# Patient Record
Sex: Female | Born: 1945 | Race: White | Hispanic: No | Marital: Married | Smoking: Current every day smoker
Health system: Southern US, Community
[De-identification: ages and names within clinical notes are randomized; demographics above are authoritative.]

## PROBLEM LIST (undated history)

## (undated) DIAGNOSIS — J42 Unspecified chronic bronchitis: Secondary | ICD-10-CM

## (undated) DIAGNOSIS — G43909 Migraine, unspecified, not intractable, without status migrainosus: Secondary | ICD-10-CM

## (undated) DIAGNOSIS — E7800 Pure hypercholesterolemia, unspecified: Secondary | ICD-10-CM

## (undated) HISTORY — DX: Unspecified chronic bronchitis: J42

## (undated) HISTORY — DX: Migraine, unspecified, not intractable, without status migrainosus: G43.909

## (undated) HISTORY — DX: Pure hypercholesterolemia, unspecified: E78.00

---
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
## 1977-07-19 HISTORY — PX: BREAST EXCISIONAL BIOPSY: SUR124

## 1977-07-19 HISTORY — PX: BREAST BIOPSY: SHX20

## 1977-07-19 HISTORY — PX: ABDOMINAL HYSTERECTOMY: SHX81

## 2004-10-07 ENCOUNTER — Ambulatory Visit: Payer: Self-pay | Admitting: Internal Medicine

## 2005-12-29 ENCOUNTER — Ambulatory Visit: Payer: Self-pay | Admitting: Chiropractic Medicine

## 2008-05-09 ENCOUNTER — Ambulatory Visit: Payer: Self-pay | Admitting: Internal Medicine

## 2008-06-07 ENCOUNTER — Ambulatory Visit: Payer: Self-pay | Admitting: Internal Medicine

## 2012-06-15 ENCOUNTER — Ambulatory Visit: Payer: Self-pay | Admitting: Internal Medicine

## 2012-06-16 ENCOUNTER — Encounter: Payer: Self-pay | Admitting: Internal Medicine

## 2012-06-23 ENCOUNTER — Ambulatory Visit (INDEPENDENT_AMBULATORY_CARE_PROVIDER_SITE_OTHER): Payer: No Typology Code available for payment source | Admitting: Internal Medicine

## 2012-06-23 ENCOUNTER — Encounter: Payer: Self-pay | Admitting: Internal Medicine

## 2012-06-23 VITALS — BP 137/85 | HR 87 | Temp 97.8°F | Ht 65.0 in | Wt 151.0 lb

## 2012-06-23 DIAGNOSIS — E7800 Pure hypercholesterolemia, unspecified: Secondary | ICD-10-CM

## 2012-06-23 DIAGNOSIS — R5383 Other fatigue: Secondary | ICD-10-CM

## 2012-06-23 DIAGNOSIS — R5381 Other malaise: Secondary | ICD-10-CM

## 2012-06-23 MED ORDER — CEFDINIR 300 MG PO CAPS: 300.0000 mg | ORAL_CAPSULE | Freq: Two times a day (BID) | ORAL | Status: DC

## 2012-06-23 MED ORDER — ATORVASTATIN CALCIUM 20 MG PO TABS: 20.0000 mg | ORAL_TABLET | Freq: Every day | ORAL | Status: DC

## 2012-06-23 NOTE — Patient Instructions (Addendum)
It was nice meeting you today.  I am glad you have been doing well.  Let me know if you have any problems.

## 2012-06-25 ENCOUNTER — Encounter: Payer: Self-pay | Admitting: Internal Medicine

## 2012-06-25 DIAGNOSIS — E7800 Pure hypercholesterolemia, unspecified: Secondary | ICD-10-CM | POA: Insufficient documentation

## 2012-06-25 NOTE — Assessment & Plan Note (Signed)
On Lipitor.  Low fat/low cholesterol diet and exercise.  Check lipid panel and liver function.

## 2012-06-25 NOTE — Progress Notes (Signed)
  [name], female    DOB: 09/12/1945, 66 y.o.   MRN: [id number]  HPI 66 year old female with past history of tobacco abuse (with chronic bronchitis) and hypercholesterolemia who comes in today to follow up on these issues as well as for a complete physical exam.  She is here to establish care.  Former patient of Dr Santo Held.  She states that starting four weeks ago she developed increased drainage.  Has progressed to increased cough and congestion.  Decreased taste and dry mouth.  No chest pain or wheezing. Was treated with Amoxicillin and then Levaquin - but did not finish the Levaquin secondary to intolerance.  No sob.  We discussed the need to stop smoking.  Discussed multiple treatment options.  Stays active.  Bowels stable.    Past Medical History  Diagnosis Date  . Migraines   . Hypercholesterolemia   . Chronic bronchitis     Review of Systems Patient denies any headache, lightheadedness or dizziness.  No chest pain, tightness or palpitations.  No increased shortness of breath, but does still have increased cough and congestion.  No nausea or vomiting.  No acid reflux.  No abdominal pain or cramping.  No bowel change, such as diarrhea, constipation, BRBPR or melana.  No urine change.  Overall she feels she is doing well.  Discussed the need to quit smoking.  Has tried patches, wellbutrin and hpnosis.  Plans to try electronic cigarettes now.  Some fatigue, but overall feeling well.       Objective:   Physical Exam Filed Vitals:   06/23/12 1332  BP: 137/85  Pulse: 87  Temp: 97.8 F (39.22 C)   66 year old female in no acute distress.   HEENT:  Nares- clear.  Oropharynx - without lesions. NECK:  Supple.  Nontender.  No audible bruit.  HEART:  Appears to be regular. LUNGS:  No crackles or wheezing audible.  Respirations even and unlabored.  RADIAL PULSE:  Equal bilaterally.    BREASTS:  No nipple discharge or nipple retraction present.  Could not  appreciate any distinct nodules or axillary adenopathy.  ABDOMEN:  Soft, nontender.  Bowel sounds present and normal.  No audible abdominal bruit.  GU:  Normal external genitalia.  Vaginal vault without lesions.  S/p hysterectomy.  Could not appreciate any adnexal masses or tenderness.   RECTAL:  Heme negative.   EXTREMITIES:  No increased edema present.  DP pulses palpable and equal bilaterally.           Assessment & Plan:  BRONCHITIS.  Still with increased cough and congestion.  Partially treated.  Will treat with omnicef 300mg  bid x 10 days.  Robitussin as directed.  Saline nasal flushes.  Rest.  Fluids.  Explained to her if symptoms changed, worsened or do not resolve - she is to be reevaluated.  FATIGUE.  Check cbc, met c and tsh.      HEALTH MAINTENANCE.  Physical today.  Mammogram 11/13 - normal (done at [facility]).  Due a colonoscopy.  Has her forms and plans to contact Dr Elliot's office.  I spent 45 minutes with the patient and more than 50% of the time was spent  In consultation regarding the above.

## 2012-08-08 ENCOUNTER — Other Ambulatory Visit (INDEPENDENT_AMBULATORY_CARE_PROVIDER_SITE_OTHER): Payer: No Typology Code available for payment source

## 2012-08-08 DIAGNOSIS — E7800 Pure hypercholesterolemia, unspecified: Secondary | ICD-10-CM

## 2012-08-08 DIAGNOSIS — R5381 Other malaise: Secondary | ICD-10-CM

## 2012-08-08 DIAGNOSIS — R5383 Other fatigue: Secondary | ICD-10-CM

## 2012-08-08 LAB — COMPREHENSIVE METABOLIC PANEL
ALT: 28 U/L (ref 0–35)
AST: 23 U/L (ref 0–37)
Alkaline Phosphatase: 73 U/L (ref 39–117)
Creatinine, Ser: 0.8 mg/dL (ref 0.4–1.2)
Sodium: 138 mEq/L (ref 135–145)
Total Bilirubin: 0.7 mg/dL (ref 0.3–1.2)
Total Protein: 7.5 g/dL (ref 6.0–8.3)

## 2012-08-08 LAB — CBC WITH DIFFERENTIAL/PLATELET
Basophils Absolute: 0 10*3/uL (ref 0.0–0.1)
Eosinophils Absolute: 0.3 10*3/uL (ref 0.0–0.7)
HCT: 40.8 % (ref 36.0–46.0)
Lymphs Abs: 2.6 10*3/uL (ref 0.7–4.0)
MCHC: 33.8 g/dL (ref 30.0–36.0)
MCV: 94.5 fl (ref 78.0–100.0)
Monocytes Absolute: 0.6 10*3/uL (ref 0.1–1.0)
Monocytes Relative: 7.3 % (ref 3.0–12.0)
Neutro Abs: 4.8 10*3/uL (ref 1.4–7.7)
Platelets: 383 10*3/uL (ref 150.0–400.0)
RDW: 13.4 % (ref 11.5–14.6)

## 2012-08-08 LAB — LIPID PANEL
HDL: 71 mg/dL (ref >39.00)
Total CHOL/HDL Ratio: 3
Triglycerides: 176 mg/dL — ABNORMAL HIGH (ref 0.0–149.0)
VLDL: 35.2 mg/dL (ref 0.0–40.0)

## 2012-12-19 LAB — HM COLONOSCOPY

## 2012-12-26 ENCOUNTER — Encounter: Payer: Self-pay | Admitting: Internal Medicine

## 2012-12-26 ENCOUNTER — Ambulatory Visit (INDEPENDENT_AMBULATORY_CARE_PROVIDER_SITE_OTHER): Payer: Medicare Other | Admitting: Internal Medicine

## 2012-12-26 VITALS — BP 120/80 | HR 75 | Temp 97.8°F | Ht 65.0 in | Wt 150.2 lb

## 2012-12-26 DIAGNOSIS — E7800 Pure hypercholesterolemia, unspecified: Secondary | ICD-10-CM

## 2012-12-26 DIAGNOSIS — K5790 Diverticulosis of intestine, part unspecified, without perforation or abscess without bleeding: Secondary | ICD-10-CM

## 2012-12-26 DIAGNOSIS — K5730 Diverticulosis of large intestine without perforation or abscess without bleeding: Secondary | ICD-10-CM

## 2012-12-26 NOTE — Progress Notes (Signed)
  [name], female    DOB: 1945/12/17, 67 y.o.   MRN: [id number]  HPI 67 year old female with past history of tobacco abuse (with chronic bronchitis) and hypercholesterolemia who comes in today for a scheduled follow up.  No sob.  We discussed the need to stop smoking.  Breathing stable.  She does have some increased drainage.  No significant sinus pressure.  No increased cough or congestion.  Stays active.  Bowels stable.  Overall she feels she is doing well.  Had colonoscopy last week.  Revealed diverticulosis and hemorrhoids.     Past Medical History  Diagnosis Date  . Migraines   . Hypercholesterolemia   . Chronic bronchitis     Current Outpatient Prescriptions on File Prior to Visit  Medication Sig Dispense Refill  . atorvastatin (LIPITOR) 20 MG tablet Take 1 tablet (20 mg total) by mouth daily.  90 tablet  3  . cholecalciferol (VITAMIN D) 400 UNITS TABS Take 400 Units by mouth daily.      . fish oil-omega-3 fatty acids 1000 MG capsule Take 1 g by mouth daily.      Marland Kitchen FLAXSEED, LINSEED, PO Take by mouth.       No current facility-administered medications on file prior to visit.    Review of Systems Patient denies any headache, lightheadedness or dizziness.  No chest pain, tightness or palpitations.  No increased shortness of breath.  Some increased drainage.  No increased cough or chest congestion.  No nausea or vomiting.  No acid reflux.  No abdominal pain or cramping.  No bowel change, such as diarrhea, constipation, BRBPR or melana.  No urine change.  Overall she feels she is doing well.  Discussed the need to quit smoking.  Has tried patches, wellbutrin and hypnosis.       Objective:   Physical Exam  Filed Vitals:   12/26/12 0802  BP: 120/80  Pulse: 75  Temp: 97.8 F (48.85 C)   67 year old female in no acute distress.   HEENT:  Nares- clear.  Oropharynx - without lesions. NECK:  Supple.  Nontender.  No audible bruit.  HEART:  Appears  to be regular. LUNGS:  No crackles or wheezing audible.  Respirations even and unlabored.  RADIAL PULSE:  Equal bilaterally.   ABDOMEN:  Soft, nontender.  Bowel sounds present and normal.  No audible abdominal bruit.  EXTREMITIES:  No increased edema present.  DP pulses palpable and equal bilaterally.           Assessment & Plan:  PROBABLE ALLERGIES.  Increased post nasal drainage.  Nasonex given.  Use saline nasal spray as directed.    HEALTH MAINTENANCE.  Physical last visit.   Mammogram 11/13 - normal (done at [facility]).  Had colonoscopy last week.  Revealed diverticulosis and hemorrhoids.

## 2012-12-27 ENCOUNTER — Encounter: Payer: Self-pay | Admitting: Internal Medicine

## 2012-12-27 DIAGNOSIS — K5790 Diverticulosis of intestine, part unspecified, without perforation or abscess without bleeding: Secondary | ICD-10-CM | POA: Insufficient documentation

## 2012-12-27 NOTE — Assessment & Plan Note (Signed)
On Lipitor.  Low fat/low cholesterol diet and exercise.  Check lipid panel and liver function.

## 2012-12-27 NOTE — Assessment & Plan Note (Signed)
Colonoscopy last week revealed diverticulosis and hemorrhoids.  Asymptomatic.

## 2013-04-16 ENCOUNTER — Telehealth: Payer: Self-pay | Admitting: Internal Medicine

## 2013-04-16 NOTE — Telephone Encounter (Signed)
I an ok taking them as new pts (as long as her daughter's children are >18).  Thanks

## 2013-04-16 NOTE — Telephone Encounter (Signed)
Council [employer]ic , Monica Morales's daughter called wanting to establish herself and her son and daughter as new patient's with you. Please advise.

## 2013-05-30 LAB — HM MAMMOGRAPHY: HM Mammogram: NEGATIVE

## 2013-05-31 ENCOUNTER — Encounter: Payer: Self-pay | Admitting: Internal Medicine

## 2013-06-27 ENCOUNTER — Other Ambulatory Visit (INDEPENDENT_AMBULATORY_CARE_PROVIDER_SITE_OTHER): Payer: Medicare Other

## 2013-06-27 ENCOUNTER — Encounter (INDEPENDENT_AMBULATORY_CARE_PROVIDER_SITE_OTHER): Payer: Self-pay

## 2013-06-27 DIAGNOSIS — E7800 Pure hypercholesterolemia, unspecified: Secondary | ICD-10-CM

## 2013-06-27 LAB — LIPID PANEL
HDL: 63.7 mg/dL (ref >39.00)
Total CHOL/HDL Ratio: 3
VLDL: 45.8 mg/dL — ABNORMAL HIGH (ref 0.0–40.0)

## 2013-06-27 LAB — HEPATIC FUNCTION PANEL
AST: 26 U/L (ref 0–37)
Albumin: 4.5 g/dL (ref 3.5–5.2)

## 2013-07-02 ENCOUNTER — Encounter: Payer: Self-pay | Admitting: Internal Medicine

## 2013-07-02 ENCOUNTER — Ambulatory Visit (INDEPENDENT_AMBULATORY_CARE_PROVIDER_SITE_OTHER): Payer: Medicare Other | Admitting: Internal Medicine

## 2013-07-02 VITALS — BP 132/80 | HR 81 | Temp 98.2°F | Resp 12 | Ht 63.5 in | Wt 150.0 lb

## 2013-07-02 DIAGNOSIS — K5790 Diverticulosis of intestine, part unspecified, without perforation or abscess without bleeding: Secondary | ICD-10-CM

## 2013-07-02 DIAGNOSIS — R030 Elevated blood-pressure reading, without diagnosis of hypertension: Secondary | ICD-10-CM | POA: Insufficient documentation

## 2013-07-02 DIAGNOSIS — K5730 Diverticulosis of large intestine without perforation or abscess without bleeding: Secondary | ICD-10-CM

## 2013-07-02 DIAGNOSIS — Z720 Tobacco use: Secondary | ICD-10-CM

## 2013-07-02 DIAGNOSIS — F17200 Nicotine dependence, unspecified, uncomplicated: Secondary | ICD-10-CM

## 2013-07-02 DIAGNOSIS — D473 Essential (hemorrhagic) thrombocythemia: Secondary | ICD-10-CM | POA: Insufficient documentation

## 2013-07-02 DIAGNOSIS — IMO0001 Reserved for inherently not codable concepts without codable children: Secondary | ICD-10-CM

## 2013-07-02 DIAGNOSIS — E7800 Pure hypercholesterolemia, unspecified: Secondary | ICD-10-CM

## 2013-07-02 DIAGNOSIS — J069 Acute upper respiratory infection, unspecified: Secondary | ICD-10-CM

## 2013-07-02 MED ORDER — ATORVASTATIN CALCIUM 20 MG PO TABS: 20.0000 mg | ORAL_TABLET | Freq: Every day | ORAL | Status: DC

## 2013-07-02 NOTE — Assessment & Plan Note (Addendum)
Colonoscopy 6/14 revealed diverticulosis and hemorrhoids.  Asymptomatic.   

## 2013-07-02 NOTE — Progress Notes (Signed)
Pre visit review using our clinic review tool, if applicable. No additional management support is needed unless otherwise documented below in the visit note. 

## 2013-07-02 NOTE — Assessment & Plan Note (Addendum)
On Lipitor.  Low fat/low cholesterol diet and exercise.  Lipid panel 06/27/13 revealed total cholesterol 217, triglycerides 229, HDL 64 and LDL 109.  Triglycerides increased.  [facility] Lipid diet given.  Continue atorvastatin.  Follow.   

## 2013-07-02 NOTE — Assessment & Plan Note (Signed)
Have discussed with her the need to quit smoking.  Follow.

## 2013-07-02 NOTE — Progress Notes (Signed)
  [name], female    DOB: 23-Dec-1945, 67 y.o.   MRN: [id number]  HPI 67 year old female with past history of tobacco abuse (with chronic bronchitis) and hypercholesterolemia who comes in today to follow up on these issues as well as for a complete physical exam.  States overall she has been doing relatively well.  She does report that starting last week she began sneezing.  Increased cough and drainage.  Cough minimally productive.  She started her husband's abx 6 days ago.  Also taking mucinex DM.  Cough worse at night.  No vomiting or diarrhea.  No sob.  No chest tightness.  We have discussed the need to stop smoking.   Stays active.  Bowels stable.  Overall she feels she is doing well.  Had colonoscopy 6/14.   Revealed diverticulosis and hemorrhoids.     Past Medical History  Diagnosis Date  . Migraines   . Hypercholesterolemia   . Chronic bronchitis     Current Outpatient Prescriptions on File Prior to Visit  Medication Sig Dispense Refill  . atorvastatin (LIPITOR) 20 MG tablet Take 1 tablet (20 mg total) by mouth daily.  90 tablet  3  . cholecalciferol (VITAMIN D) 400 UNITS TABS Take 400 Units by mouth daily.      . fish oil-omega-3 fatty acids 1000 MG capsule Take 1 g by mouth daily.      Marland Kitchen FLAXSEED, LINSEED, PO Take by mouth.       No current facility-administered medications on file prior to visit.    Review of Systems Patient denies any headache, lightheadedness or dizziness.  No chest pain, tightness or palpitations.  No increased shortness of breath.  Some increased drainage.  Cough and chest congestion as outlined.   No nausea or vomiting.  No acid reflux.  No abdominal pain or cramping.  No bowel change, such as diarrhea, constipation, BRBPR or melana.  No urine change.  Overall she feels she is doing well.  Have discussed the need to quit smoking.  Has tried patches, wellbutrin and hypnosis.       Objective:   Physical Exam  Filed Vitals:    07/02/13 1027  BP: 132/80  Pulse: 81  Temp: 98.2 F (36.8 C)  Resp: 12   Blood pressure recheck:  140-142/96, pulse 19  67 year old female in no acute distress.   HEENT:  Nares- slightly erythematous turbinates.  Oropharynx - without lesions.  TMs visualized - without erythema.  NECK:  Supple.  Nontender.  No audible bruit.  HEART:  Appears to be regular. LUNGS:  No crackles or wheezing audible.  Respirations even and unlabored.  RADIAL PULSE:  Equal bilaterally.    BREASTS:  No nipple discharge or nipple retraction present.  Could not appreciate any distinct nodules or axillary adenopathy.  ABDOMEN:  Soft, nontender.  Bowel sounds present and normal.  No audible abdominal bruit.  GU:  Not performed.     EXTREMITIES:  No increased edema present.  DP pulses palpable and equal bilaterally.          Assessment & Plan:  HEALTH MAINTENANCE.  Physical today.   Mammogram 05/30/13 - Birads I  (done at [facility]).  Had colonoscopy 6/14.   Revealed diverticulosis and hemorrhoids.   States had a recent mammogram through Dr Santo Held office.  Obtain results.  States was normal.

## 2013-07-02 NOTE — Assessment & Plan Note (Signed)
Has been on her husbands abx for 6 days now.  Taking mucinex DM.  Feeling better.  Will complete the abx course.  Change Mucinex DM to one tablet in the am and robitussin DM in the evening.  Saline nasal spray to help with drainage.  Follow.  She is to notify me if symptoms worsen or do not completely resolve.

## 2013-07-02 NOTE — Assessment & Plan Note (Signed)
Has not had an issue previously with elevated blood pressure.  Elevated today.  May be related to being sick.  Will treat infection.  Have her spot check her pressure.  Get her back in soon to reassess.  If persistent elevation, will require medication.

## 2013-07-20 ENCOUNTER — Ambulatory Visit: Payer: Self-pay | Admitting: Internal Medicine

## 2013-07-20 ENCOUNTER — Telehealth: Payer: Self-pay | Admitting: Internal Medicine

## 2013-07-20 ENCOUNTER — Ambulatory Visit (INDEPENDENT_AMBULATORY_CARE_PROVIDER_SITE_OTHER): Payer: Medicare HMO | Admitting: Internal Medicine

## 2013-07-20 ENCOUNTER — Encounter: Payer: Self-pay | Admitting: Internal Medicine

## 2013-07-20 VITALS — BP 130/90 | HR 83 | Temp 97.5°F | Wt 149.0 lb

## 2013-07-20 DIAGNOSIS — J189 Pneumonia, unspecified organism: Secondary | ICD-10-CM | POA: Insufficient documentation

## 2013-07-20 MED ORDER — LEVOFLOXACIN 500 MG PO TABS: 500.0000 mg | ORAL_TABLET | Freq: Every day | ORAL | Status: DC

## 2013-07-20 MED ORDER — HYDROCODONE-HOMATROPINE 5-1.5 MG/5ML PO SYRP: 5.0000 mL | ORAL_SOLUTION | Freq: Four times a day (QID) | ORAL | Status: DC | PRN

## 2013-07-20 NOTE — Telephone Encounter (Signed)
Patient informed and verbalized understanding

## 2013-07-20 NOTE — Telephone Encounter (Signed)
°  Patient Information:  Caller Name: Ellarae  Phone: [phone]  Patient: Monica, Morales  Gender: Female  DOB: [birth year]  Age: 68 Years  PCP: Einar Pheasant  Office Follow Up:  Does the office need to follow up with this patient?: No  Instructions For The Office: N/A   Symptoms  Reason For Call & Symptoms: Cough that is sometimes productive that is not improving. Diagnosed with bronchitis at walk in clinic 12/28 and has been taking abx since then.   Reviewed Health History In EMR: Yes  Reviewed Medications In EMR: Yes  Reviewed Allergies In EMR: Yes  Reviewed Surgeries / Procedures: Yes  Date of Onset of Symptoms: 07/15/2013  Treatments Tried: Abx, prescription cough syrup, Mucinex  Treatments Tried Worked: No  Guideline(s) Used:  Cough  Disposition Per Guideline:   See Today or Tomorrow in Office  Reason For Disposition Reached:   Continuous (nonstop) coughing interferes with work or school and no improvement using cough treatment per Care Advice  Advice Given:  Reassurance  Coughing is the way that our lungs remove irritants and mucus. It helps protect our lungs from getting pneumonia.  Expected Course:   The expected course depends on what is causing the cough.  Viral bronchitis (chest cold) causes a cough that lasts 1 to 3 weeks. Sometimes you may cough up lots of phlegm (sputum, mucus). The mucus can normally be white, gray, yellow, or green.  Call Back If:  Difficulty breathing  Cough lasts more than 3 weeks  You become worse.  Patient Will Follow Care Advice:  YES  Appointment Scheduled:  07/20/2013 16:00:00 Appointment Scheduled Provider:  Ronette Deter (Adults only)

## 2013-07-20 NOTE — Progress Notes (Signed)
Pre-visit discussion using our clinic review tool. No additional management support is needed unless otherwise documented below in the visit note.  

## 2013-07-20 NOTE — Assessment & Plan Note (Signed)
Symptoms, and her exam today are most consistent with left lower lobe pneumonia. However, chest x-ray shows no acute left-sided infiltrate, however suggests atelectasis and blunting of the costophrenic angle on the right. Given that her cough has been persistent for 4 weeks, recommended that we start Levaquin and repeat a CXR in 3-4 weeks to make sure right sided atelectasis has cleared. If cough persists, would have low threshold to get CT chest. Will continue Hycodan as needed for cough.Follow up in 1 week.

## 2013-07-20 NOTE — Telephone Encounter (Signed)
Preliminary report on CXR showed some atelectasis (collapse of small airways) in the right lower lung but no clear pneumonia. This could be related to recent bronchitis. I would like for her to start the Levaquin as directed. We will wait on final report on CXR, but most likely, I will plan to repeat an xray in 3-4 weeks. She should still keep follow up appointment next week for recheck or call sooner with worsening symptoms.

## 2013-07-20 NOTE — Telephone Encounter (Signed)
FYI

## 2013-07-20 NOTE — Progress Notes (Signed)
[name], female    DOB: 01-05-1946, 68 y.o.   MRN: [id number]  HPI 68 year old female with history of tobacco abuse presents for acute visit complaining of persistent cough productive of purulent sputum. Initially,developed cough 4 weeks ago. Took husband's Rx for Augmentin with no improvement. Sunday developed worsening cough and chest tightness. Went to urgent care, [facility], where rapid flu was neg. Started on Clarithromycin. Slight improvement over last few days. However, continues to have cough productive of purulent sputum and occasional right lower chest wall pain. She denies any fever or chills. She denies nasal congestion, sore throat. Her husband is ill with flu. She has been taking codeine-based cough syrup from urgent care with some improvement in symptoms at night.  Outpatient Encounter Prescriptions as of 07/20/2013  Medication Sig  . atorvastatin (LIPITOR) 20 MG tablet Take 1 tablet (20 mg total) by mouth daily.  . cholecalciferol (VITAMIN D) 400 UNITS TABS Take 400 Units by mouth daily.  . fish oil-omega-3 fatty acids 1000 MG capsule Take 1 g by mouth daily.  Marland Kitchen FLAXSEED, LINSEED, PO Take by mouth.  Marland Kitchen HYDROcodone-homatropine (HYCODAN) 5-1.5 MG/5ML syrup Take 5 mLs by mouth every 6 (six) hours as needed for cough.  . [DISCONTINUED] clarithromycin (BIAXIN) 500 MG tablet Take 500 mg by mouth 2 (two) times daily.  . [DISCONTINUED] HYDROcodone-homatropine (HYCODAN) 5-1.5 MG/5ML syrup Take 5 mLs by mouth every 6 (six) hours as needed for cough.  Marland Kitchen levofloxacin (LEVAQUIN) 500 MG tablet Take 1 tablet (500 mg total) by mouth daily.   BP 130/90  Pulse 83  Temp(Src) 97.5 F (36.4 C) (Oral)  Wt 149 lb (67.586 kg)  SpO2 98%   Review of Systems  Constitutional: Positive for fatigue. Negative for fever, chills and unexpected weight change.  HENT: Negative for congestion, ear discharge, ear pain, facial swelling, hearing loss, mouth sores, nosebleeds,  postnasal drip, rhinorrhea, sinus pressure, sneezing, sore throat, tinnitus, trouble swallowing and voice change.   Eyes: Negative for pain, discharge, redness and visual disturbance.  Respiratory: Positive for cough. Negative for chest tightness, shortness of breath, wheezing and stridor.   Cardiovascular: Positive for chest pain. Negative for palpitations and leg swelling.  Musculoskeletal: Negative for arthralgias, myalgias, neck pain and neck stiffness.  Skin: Negative for color change and rash.  Neurological: Negative for dizziness, weakness, light-headedness and headaches.  Hematological: Negative for adenopathy.       Objective:   Physical Exam  Constitutional: She is oriented to person, place, and time. She appears well-developed and well-nourished. No distress.  HENT:  Head: Normocephalic and atraumatic.  Right Ear: External ear normal.  Left Ear: External ear normal.  Nose: Nose normal.  Mouth/Throat: Oropharynx is clear and moist. No oropharyngeal exudate.  Eyes: Conjunctivae are normal. Pupils are equal, round, and reactive to light. Right eye exhibits no discharge. Left eye exhibits no discharge. No scleral icterus.  Neck: Normal range of motion. Neck supple. No tracheal deviation present. No thyromegaly present.  Cardiovascular: Normal rate, regular rhythm, normal heart sounds and intact distal pulses.  Exam reveals no gallop and no friction rub.   No murmur heard. Pulmonary/Chest: Effort normal. No accessory muscle usage. Not tachypneic. No respiratory distress. She has decreased breath sounds in the left lower field. She has no wheezes. She has rhonchi in the left lower field. She has no rales. She exhibits no tenderness.  Musculoskeletal: Normal range of motion. She exhibits no edema and no tenderness.  Lymphadenopathy:  She has no cervical adenopathy.  Neurological: She is alert and oriented to person, place, and time. No cranial nerve deficit. She exhibits normal  muscle tone. Coordination normal.  Skin: Skin is warm and dry. No rash noted. She is not diaphoretic. No erythema. No pallor.  Psychiatric: She has a normal mood and affect. Her behavior is normal. Judgment and thought content normal.          Assessment & Plan:

## 2013-07-20 NOTE — Telephone Encounter (Signed)
Noted  

## 2013-07-27 ENCOUNTER — Ambulatory Visit (INDEPENDENT_AMBULATORY_CARE_PROVIDER_SITE_OTHER): Payer: Medicare HMO | Admitting: Internal Medicine

## 2013-07-27 ENCOUNTER — Encounter (INDEPENDENT_AMBULATORY_CARE_PROVIDER_SITE_OTHER): Payer: Self-pay

## 2013-07-27 ENCOUNTER — Encounter: Payer: Self-pay | Admitting: Internal Medicine

## 2013-07-27 VITALS — BP 120/80 | HR 71 | Temp 97.6°F | Ht 63.5 in | Wt 150.5 lb

## 2013-07-27 DIAGNOSIS — J069 Acute upper respiratory infection, unspecified: Secondary | ICD-10-CM

## 2013-07-27 DIAGNOSIS — IMO0001 Reserved for inherently not codable concepts without codable children: Secondary | ICD-10-CM

## 2013-07-27 DIAGNOSIS — R030 Elevated blood-pressure reading, without diagnosis of hypertension: Secondary | ICD-10-CM

## 2013-07-27 NOTE — Progress Notes (Signed)
Pre-visit discussion using our clinic review tool. No additional management support is needed unless otherwise documented below in the visit note.  

## 2013-07-30 ENCOUNTER — Encounter: Payer: Self-pay | Admitting: Internal Medicine

## 2013-07-30 NOTE — Assessment & Plan Note (Signed)
Just completed Levaquin.  Doing better.  Breathing better.  Decreased cough.  Will need f/u cxr in 3-4 weeks.  Pt has an appt with me at the end of the month.  Will recheck CXR then.

## 2013-07-30 NOTE — Assessment & Plan Note (Signed)
Check today looks good.  Follow.

## 2013-07-30 NOTE — Progress Notes (Signed)
  [name], female    DOB: [birth year], 68 y.o.   MRN: [id number]  HPI 68 year old female with past history of tobacco abuse (with chronic bronchitis) and hypercholesterolemia who comes in today for a scheduled follow up.  Was seen last week by Dr Gilford Rile - with increased cough and congestion.  See her note for details.  Was placed on Levaquin.   Doing better.  Feels better.  No significant cough or congestion.  Breathing better.  Feels almost back to her baseline.  We discussed smoking cessation.  She has cut down.  Not ready to quit.  Eating and drinking.  No vomiting.      Past Medical History  Diagnosis Date  . Migraines   . Hypercholesterolemia   . Chronic bronchitis     Current Outpatient Prescriptions on File Prior to Visit  Medication Sig Dispense Refill  . atorvastatin (LIPITOR) 20 MG tablet Take 1 tablet (20 mg total) by mouth daily.  90 tablet  3  . cholecalciferol (VITAMIN D) 400 UNITS TABS Take 400 Units by mouth daily.      . fish oil-omega-3 fatty acids 1000 MG capsule Take 1 g by mouth daily.      Marland Kitchen FLAXSEED, LINSEED, PO Take by mouth.      Marland Kitchen HYDROcodone-homatropine (HYCODAN) 5-1.5 MG/5ML syrup Take 5 mLs by mouth every 6 (six) hours as needed for cough.  120 mL  0   No current facility-administered medications on file prior to visit.    Review of Systems Patient denies any headache, lightheadedness or dizziness.  No chest pain, tightness or palpitations.  No increased shortness of breath.  Cough and congestion improved.  Feels better.   No nausea or vomiting.  No acid reflux.  No abdominal pain or cramping.  No bowel change, such as diarrhea.         Objective:   Physical Exam  Filed Vitals:   07/27/13 1550  BP: 120/80  Pulse: 71  Temp: 97.6 F (73.62 C)   68 year old female in no acute distress.   HEENT:  Nares- clear.  Oropharynx - without lesions.  NECK:  Supple.  Nontender.  HEART:  Appears to be regular. LUNGS:  No  crackles or wheezing audible.  Respirations even and unlabored.  Good breath sounds bilaterally.      Assessment & Plan:  HEALTH MAINTENANCE.  Physical 06/22/13.   Mammogram 05/30/13 - Birads I  (done at [facility]).  Had colonoscopy 6/14.   Revealed diverticulosis and hemorrhoids.

## 2013-08-09 ENCOUNTER — Encounter (INDEPENDENT_AMBULATORY_CARE_PROVIDER_SITE_OTHER): Payer: Self-pay

## 2013-08-09 ENCOUNTER — Ambulatory Visit (INDEPENDENT_AMBULATORY_CARE_PROVIDER_SITE_OTHER): Payer: Medicare HMO | Admitting: Internal Medicine

## 2013-08-09 ENCOUNTER — Encounter: Payer: Self-pay | Admitting: Internal Medicine

## 2013-08-09 VITALS — BP 110/80 | HR 71 | Temp 98.1°F | Ht 63.5 in | Wt 152.0 lb

## 2013-08-09 DIAGNOSIS — F17200 Nicotine dependence, unspecified, uncomplicated: Secondary | ICD-10-CM

## 2013-08-09 DIAGNOSIS — K5730 Diverticulosis of large intestine without perforation or abscess without bleeding: Secondary | ICD-10-CM

## 2013-08-09 DIAGNOSIS — R030 Elevated blood-pressure reading, without diagnosis of hypertension: Secondary | ICD-10-CM

## 2013-08-09 DIAGNOSIS — J189 Pneumonia, unspecified organism: Secondary | ICD-10-CM

## 2013-08-09 DIAGNOSIS — IMO0001 Reserved for inherently not codable concepts without codable children: Secondary | ICD-10-CM

## 2013-08-09 DIAGNOSIS — K5790 Diverticulosis of intestine, part unspecified, without perforation or abscess without bleeding: Secondary | ICD-10-CM

## 2013-08-09 DIAGNOSIS — E7800 Pure hypercholesterolemia, unspecified: Secondary | ICD-10-CM

## 2013-08-09 DIAGNOSIS — Z720 Tobacco use: Secondary | ICD-10-CM

## 2013-08-09 NOTE — Progress Notes (Signed)
  [name], female    DOB: 01-Feb-1946, 68 y.o.   MRN: [id number]  HPI 68 year old female with past history of tobacco abuse (with chronic bronchitis) and hypercholesterolemia who comes in today for a scheduled follow up.  States overall she has been doing relatively well now.  Feels better.   No sob.  No chest tightness.  No increased cough or congestion.  We have discussed the need to stop smoking.   Stays active.  Bowels stable.  Overall she feels she is doing well.  Had colonoscopy 6/14.   Revealed diverticulosis and hemorrhoids.  Recently treated for pneumonia.  Abnormal cxr.  Needs f/u cxr.  Discussed with her today.  Blood pressure has been doing well.  Brought in her cuff from home. Blood pressures averaging 116-122/70-80.    Past Medical History  Diagnosis Date  . Migraines   . Hypercholesterolemia   . Chronic bronchitis     Current Outpatient Prescriptions on File Prior to Visit  Medication Sig Dispense Refill  . atorvastatin (LIPITOR) 20 MG tablet Take 1 tablet (20 mg total) by mouth daily.  90 tablet  3  . cholecalciferol (VITAMIN D) 400 UNITS TABS Take 400 Units by mouth daily.      . fish oil-omega-3 fatty acids 1000 MG capsule Take 1 g by mouth daily.      Marland Kitchen FLAXSEED, LINSEED, PO Take by mouth.      Marland Kitchen HYDROcodone-homatropine (HYCODAN) 5-1.5 MG/5ML syrup Take 5 mLs by mouth every 6 (six) hours as needed for cough.  120 mL  0   No current facility-administered medications on file prior to visit.    Review of Systems Patient denies any headache, lightheadedness or dizziness.  No significant sinus or allergy symptoms.  No chest pain, tightness or palpitations.  No increased shortness of breath.  No increased cough or congestion.  No nausea or vomiting.  No acid reflux.  No abdominal pain or cramping.  No bowel change, such as diarrhea, constipation, BRBPR or melana.  No urine change.  Overall she feels she is doing well.  Have discussed the need to  quit smoking.  Has tried patches, wellbutrin and hypnosis.  Again discussed with her today.  Blood pressures as outlined.       Objective:   Physical Exam  Filed Vitals:   08/09/13 0853  BP: 110/80  Pulse: 71  Temp: 98.1 F (36.7 C)   Blood pressure recheck:  55-26/30  68 year old female in no acute distress.   HEENT:  Nares- clear.   Oropharynx - without lesions.  TMs visualized - without erythema.  NECK:  Supple.  Nontender.  No audible bruit.  HEART:  Appears to be regular. LUNGS:  No crackles or wheezing audible.  Respirations even and unlabored.  RADIAL PULSE:  Equal bilaterally.   ABDOMEN:  Soft, nontender.  Bowel sounds present and normal.  No audible abdominal bruit.     EXTREMITIES:  No increased edema present.  DP pulses palpable and equal bilaterally.          Assessment & Plan:  HEALTH MAINTENANCE.  Physical 06/22/13.   Mammogram 05/30/13 - Birads I  (done at [facility]).  Had colonoscopy 6/14.   Revealed diverticulosis and hemorrhoids.

## 2013-08-09 NOTE — Progress Notes (Signed)
Pre-visit discussion using our clinic review tool. No additional management support is needed unless otherwise documented below in the visit note.  

## 2013-08-12 ENCOUNTER — Encounter: Payer: Self-pay | Admitting: Internal Medicine

## 2013-08-12 NOTE — Assessment & Plan Note (Signed)
Colonoscopy 6/14 revealed diverticulosis and hemorrhoids.  Asymptomatic.

## 2013-08-12 NOTE — Assessment & Plan Note (Signed)
Have discussed with her the need to quit smoking.  She is not ready to quit.  Follow.   

## 2013-08-12 NOTE — Assessment & Plan Note (Signed)
On Lipitor.  Low fat/low cholesterol diet and exercise.  Lipid panel 06/27/13 revealed total cholesterol 217, triglycerides 229, HDL 64 and LDL 109.  Triglycerides increased.  [facility] Lipid diet given.  Continue atorvastatin.  Follow.

## 2013-08-12 NOTE — Assessment & Plan Note (Signed)
Her cuff correlates.  Her outside checks are under good control.  Hold on medication.  Have her spot check her pressure.  Notify me if elevation.  Follow.

## 2013-08-12 NOTE — Assessment & Plan Note (Signed)
Symptoms have resolved.  No sob.  No increased cough.  Recheck cxr.  Further w/up pending results.

## 2013-08-14 ENCOUNTER — Encounter: Payer: Self-pay | Admitting: Internal Medicine

## 2013-08-16 ENCOUNTER — Ambulatory Visit: Payer: Self-pay | Admitting: Internal Medicine

## 2013-09-19 ENCOUNTER — Encounter: Payer: Self-pay | Admitting: Internal Medicine

## 2013-10-23 ENCOUNTER — Other Ambulatory Visit: Payer: Self-pay | Admitting: *Deleted

## 2013-10-23 MED ORDER — ATORVASTATIN CALCIUM 20 MG PO TABS: 20.0000 mg | ORAL_TABLET | Freq: Every day | ORAL | Status: DC

## 2013-11-06 ENCOUNTER — Other Ambulatory Visit: Payer: Self-pay | Admitting: *Deleted

## 2013-11-06 MED ORDER — ATORVASTATIN CALCIUM 20 MG PO TABS: 20.0000 mg | ORAL_TABLET | Freq: Every day | ORAL | Status: DC

## 2013-11-08 ENCOUNTER — Other Ambulatory Visit (INDEPENDENT_AMBULATORY_CARE_PROVIDER_SITE_OTHER): Payer: Commercial Managed Care - HMO

## 2013-11-08 DIAGNOSIS — E7800 Pure hypercholesterolemia, unspecified: Secondary | ICD-10-CM

## 2013-11-08 DIAGNOSIS — R030 Elevated blood-pressure reading, without diagnosis of hypertension: Secondary | ICD-10-CM

## 2013-11-08 DIAGNOSIS — IMO0001 Reserved for inherently not codable concepts without codable children: Secondary | ICD-10-CM

## 2013-11-08 DIAGNOSIS — J189 Pneumonia, unspecified organism: Secondary | ICD-10-CM

## 2013-11-08 LAB — CBC WITH DIFFERENTIAL/PLATELET
BASOS ABS: 0 10*3/uL (ref 0.0–0.1)
Basophils Relative: 0.4 % (ref 0.0–3.0)
Eosinophils Absolute: 0.3 10*3/uL (ref 0.0–0.7)
Eosinophils Relative: 3.3 % (ref 0.0–5.0)
HEMATOCRIT: 42.9 % (ref 36.0–46.0)
Hemoglobin: 14.7 g/dL (ref 12.0–15.0)
LYMPHS ABS: 2.8 10*3/uL (ref 0.7–4.0)
Lymphocytes Relative: 31.6 % (ref 12.0–46.0)
MCHC: 34.2 g/dL (ref 30.0–36.0)
MCV: 93.2 fl (ref 78.0–100.0)
MONOS PCT: 6.8 % (ref 3.0–12.0)
Monocytes Absolute: 0.6 10*3/uL (ref 0.1–1.0)
NEUTROS PCT: 57.9 % (ref 43.0–77.0)
Neutro Abs: 5.1 10*3/uL (ref 1.4–7.7)
PLATELETS: 407 10*3/uL — AB (ref 150.0–400.0)
RBC: 4.6 Mil/uL (ref 3.87–5.11)
RDW: 13.3 % (ref 11.5–14.6)
WBC: 8.8 10*3/uL (ref 4.5–10.5)

## 2013-11-08 LAB — HEPATIC FUNCTION PANEL
ALT: 20 U/L (ref 0–35)
AST: 23 U/L (ref 0–37)
Albumin: 4.2 g/dL (ref 3.5–5.2)
Alkaline Phosphatase: 78 U/L (ref 39–117)
Bilirubin, Direct: 0.1 mg/dL (ref 0.0–0.3)
Total Bilirubin: 0.7 mg/dL (ref 0.3–1.2)
Total Protein: 7.5 g/dL (ref 6.0–8.3)

## 2013-11-08 LAB — BASIC METABOLIC PANEL
BUN: 17 mg/dL (ref 6–23)
CO2: 29 mEq/L (ref 19–32)
CREATININE: 0.7 mg/dL (ref 0.4–1.2)
Calcium: 9.6 mg/dL (ref 8.4–10.5)
Chloride: 104 mEq/L (ref 96–112)
GFR: 89.47 mL/min (ref >60.00)
GLUCOSE: 87 mg/dL (ref 70–99)
Potassium: 4.1 mEq/L (ref 3.5–5.1)
Sodium: 139 mEq/L (ref 135–145)

## 2013-11-08 LAB — LIPID PANEL
CHOLESTEROL: 204 mg/dL — AB (ref 0–200)
HDL: 58.1 mg/dL (ref >39.00)
LDL Cholesterol: 95 mg/dL (ref 0–99)
TRIGLYCERIDES: 254 mg/dL — AB (ref 0.0–149.0)
Total CHOL/HDL Ratio: 4
VLDL: 50.8 mg/dL — ABNORMAL HIGH (ref 0.0–40.0)

## 2013-11-08 LAB — TSH: TSH: 3.11 u[IU]/mL (ref 0.35–5.50)

## 2013-11-12 ENCOUNTER — Encounter: Payer: Self-pay | Admitting: Internal Medicine

## 2013-11-12 ENCOUNTER — Ambulatory Visit (INDEPENDENT_AMBULATORY_CARE_PROVIDER_SITE_OTHER): Payer: Commercial Managed Care - HMO | Admitting: Internal Medicine

## 2013-11-12 VITALS — BP 110/90 | HR 77 | Temp 97.9°F | Ht 63.5 in | Wt 147.8 lb

## 2013-11-12 DIAGNOSIS — IMO0001 Reserved for inherently not codable concepts without codable children: Secondary | ICD-10-CM

## 2013-11-12 DIAGNOSIS — J189 Pneumonia, unspecified organism: Secondary | ICD-10-CM

## 2013-11-12 DIAGNOSIS — D473 Essential (hemorrhagic) thrombocythemia: Secondary | ICD-10-CM

## 2013-11-12 DIAGNOSIS — R030 Elevated blood-pressure reading, without diagnosis of hypertension: Secondary | ICD-10-CM

## 2013-11-12 DIAGNOSIS — K5730 Diverticulosis of large intestine without perforation or abscess without bleeding: Secondary | ICD-10-CM

## 2013-11-12 DIAGNOSIS — Z720 Tobacco use: Secondary | ICD-10-CM

## 2013-11-12 DIAGNOSIS — D75839 Thrombocytosis, unspecified: Secondary | ICD-10-CM

## 2013-11-12 DIAGNOSIS — E7800 Pure hypercholesterolemia, unspecified: Secondary | ICD-10-CM

## 2013-11-12 DIAGNOSIS — K5790 Diverticulosis of intestine, part unspecified, without perforation or abscess without bleeding: Secondary | ICD-10-CM

## 2013-11-12 DIAGNOSIS — F17200 Nicotine dependence, unspecified, uncomplicated: Secondary | ICD-10-CM

## 2013-11-12 NOTE — Assessment & Plan Note (Signed)
Have discussed with her the need to quit smoking.  She is not ready to quit.  Follow.   

## 2013-11-12 NOTE — Assessment & Plan Note (Addendum)
Symptoms have resolved.  No sob.  No increased cough.  F/u cxr 08/16/12 clear.

## 2013-11-12 NOTE — Assessment & Plan Note (Addendum)
Her cuff correlates.  Her outside checks are under good control.  Hold on medication.  Have her spot check her pressure.  Notify me if elevation.  Follow.  Better on recheck today.

## 2013-11-12 NOTE — Progress Notes (Signed)
Pre visit review using our clinic review tool, if applicable. No additional management support is needed unless otherwise documented below in the visit note. 

## 2013-11-12 NOTE — Progress Notes (Signed)
  [name], female    DOB: September 09, 1945, 68 y.o.   MRN: [id number]  HPI 68 year old female with past history of tobacco abuse (with chronic bronchitis) and hypercholesterolemia who comes in today for a scheduled follow up.  States overall she has been doing relatively well.   No sob.  No chest tightness.  No increased cough or congestion.  We have discussed the need to stop smoking.   Again discussed this with her today.  She declines to stop at this time.  Stays active.  Bowels stable.  Overall she feels she is doing well.  Had colonoscopy 6/14.   Revealed diverticulosis and hemorrhoids.  Recently treated for pneumonia.  Abnormal cxr.  F/u cxr clear.   Blood pressure has been doing well per her report.  Blood pressure averaging 112-117/68.      Past Medical History  Diagnosis Date  . Migraines   . Hypercholesterolemia   . Chronic bronchitis     Current Outpatient Prescriptions on File Prior to Visit  Medication Sig Dispense Refill  . atorvastatin (LIPITOR) 20 MG tablet Take 1 tablet (20 mg total) by mouth daily.  90 tablet  3  . cholecalciferol (VITAMIN D) 400 UNITS TABS Take 400 Units by mouth daily.      . fish oil-omega-3 fatty acids 1000 MG capsule Take 1 g by mouth daily.      Marland Kitchen FLAXSEED, LINSEED, PO Take by mouth.       No current facility-administered medications on file prior to visit.    Review of Systems Patient denies any headache, lightheadedness or dizziness.  No significant sinus or allergy symptoms.  No chest pain, tightness or palpitations.  No increased shortness of breath.  No increased cough or congestion.  No nausea or vomiting.  No acid reflux.  No abdominal pain or cramping.  No bowel change, such as diarrhea, constipation, BRBPR or melana.  No urine change.  Overall she feels she is doing well.  Have discussed the need to quit smoking.  Has tried patches, wellbutrin and hypnosis.  Again discussed with her today.  Blood pressures as  outlined.       Objective:   Physical Exam  Filed Vitals:   11/12/13 0756  BP: 110/90  Pulse: 77  Temp: 97.9 F (36.6 C)   Blood pressure recheck:  16/58  68 year old female in no acute distress.   HEENT:  Nares- clear.   Oropharynx - without lesions.   NECK:  Supple.  Nontender.  HEART:  Appears to be regular. LUNGS:  No crackles or wheezing audible.  Respirations even and unlabored.  RADIAL PULSE:  Equal bilaterally.   ABDOMEN:  Soft, nontender.  Bowel sounds present and normal.  No audible abdominal bruit.     EXTREMITIES:  No increased edema present.  DP pulses palpable and equal bilaterally.          Assessment & Plan:  HEALTH MAINTENANCE.  Physical 06/22/13.   Mammogram 05/30/13 - Birads I  (done at [facility]).  Had colonoscopy 6/14 revealed diverticulosis and hemorrhoids.

## 2013-11-12 NOTE — Assessment & Plan Note (Addendum)
On Lipitor.  Low fat/low cholesterol diet and exercise.  Lipid panel 11/08/13 revealed total cholesterol 204, triglycerides 254, HDL 58 and LDL 95.  Triglycerides increased.  [facility] Lipid diet.  Continue atorvastatin.  Follow.

## 2013-11-13 ENCOUNTER — Encounter: Payer: Self-pay | Admitting: Internal Medicine

## 2013-11-13 NOTE — Assessment & Plan Note (Signed)
Platelet count recently checked and slightly elevated.  Recheck platelet count in a few weeks to confirm stable/normal.

## 2013-11-13 NOTE — Assessment & Plan Note (Signed)
Colonoscopy 6/14 revealed diverticulosis and hemorrhoids.  Asymptomatic.

## 2013-11-26 ENCOUNTER — Other Ambulatory Visit (INDEPENDENT_AMBULATORY_CARE_PROVIDER_SITE_OTHER): Payer: Commercial Managed Care - HMO

## 2013-11-26 DIAGNOSIS — D473 Essential (hemorrhagic) thrombocythemia: Secondary | ICD-10-CM

## 2013-11-26 DIAGNOSIS — D75839 Thrombocytosis, unspecified: Secondary | ICD-10-CM

## 2013-11-26 LAB — PLATELET COUNT: PLATELETS: 396 10*3/uL (ref 150–400)

## 2013-11-27 ENCOUNTER — Encounter: Payer: Self-pay | Admitting: *Deleted

## 2014-03-18 ENCOUNTER — Ambulatory Visit (INDEPENDENT_AMBULATORY_CARE_PROVIDER_SITE_OTHER): Payer: Commercial Managed Care - HMO | Admitting: Internal Medicine

## 2014-03-18 ENCOUNTER — Encounter: Payer: Self-pay | Admitting: Internal Medicine

## 2014-03-18 VITALS — BP 120/80 | HR 72 | Temp 97.9°F | Ht 63.5 in | Wt 148.0 lb

## 2014-03-18 DIAGNOSIS — D75839 Thrombocytosis, unspecified: Secondary | ICD-10-CM

## 2014-03-18 DIAGNOSIS — E7800 Pure hypercholesterolemia, unspecified: Secondary | ICD-10-CM

## 2014-03-18 DIAGNOSIS — F17200 Nicotine dependence, unspecified, uncomplicated: Secondary | ICD-10-CM

## 2014-03-18 DIAGNOSIS — R143 Flatulence: Secondary | ICD-10-CM

## 2014-03-18 DIAGNOSIS — R030 Elevated blood-pressure reading, without diagnosis of hypertension: Secondary | ICD-10-CM

## 2014-03-18 DIAGNOSIS — K219 Gastro-esophageal reflux disease without esophagitis: Secondary | ICD-10-CM | POA: Insufficient documentation

## 2014-03-18 DIAGNOSIS — Z720 Tobacco use: Secondary | ICD-10-CM

## 2014-03-18 DIAGNOSIS — K5730 Diverticulosis of large intestine without perforation or abscess without bleeding: Secondary | ICD-10-CM

## 2014-03-18 DIAGNOSIS — IMO0001 Reserved for inherently not codable concepts without codable children: Secondary | ICD-10-CM | POA: Insufficient documentation

## 2014-03-18 DIAGNOSIS — R141 Gas pain: Secondary | ICD-10-CM

## 2014-03-18 DIAGNOSIS — R142 Eructation: Secondary | ICD-10-CM

## 2014-03-18 DIAGNOSIS — D473 Essential (hemorrhagic) thrombocythemia: Secondary | ICD-10-CM

## 2014-03-18 NOTE — Progress Notes (Signed)
Pre visit review using our clinic review tool, if applicable. No additional management support is needed unless otherwise documented below in the visit note. 

## 2014-03-18 NOTE — Progress Notes (Signed)
  [name], female    DOB: [birth year], 68 y.o.   MRN: [id number]  HPI 68 year old female with past history of tobacco abuse (with chronic bronchitis) and hypercholesterolemia who comes in today for a scheduled follow up.  States overall she has been doing relatively well.   No sob.  No chest tightness.  No increased cough or congestion.  We have discussed the need to stop smoking.   Again discussed this with her today.  She declines to stop at this time.  Stays active.  Has been gardening.  Bowels stable.  Some increased gas.  Notices more after supper.  Overall she feels she is doing well.  Had colonoscopy 6/14.   Revealed diverticulosis and hemorrhoids.     Past Medical History  Diagnosis Date  . Migraines   . Hypercholesterolemia   . Chronic bronchitis     Current Outpatient Prescriptions on File Prior to Visit  Medication Sig Dispense Refill  . atorvastatin (LIPITOR) 20 MG tablet Take 1 tablet (20 mg total) by mouth daily.  90 tablet  3  . cholecalciferol (VITAMIN D) 400 UNITS TABS Take 400 Units by mouth daily.      . fish oil-omega-3 fatty acids 1000 MG capsule Take 1 g by mouth daily.      Marland Kitchen FLAXSEED, LINSEED, PO Take by mouth.       No current facility-administered medications on file prior to visit.    Review of Systems Patient denies any headache, lightheadedness or dizziness.  No significant sinus or allergy symptoms.  No chest pain, tightness or palpitations.  No increased shortness of breath.  No increased cough or congestion.  No nausea or vomiting.  No acid reflux.  No abdominal pain or cramping.  No bowel change, such as diarrhea, constipation, BRBPR or melana. Some increased gas.   No urine change.  Overall she feels she is doing well.  Have discussed the need to quit smoking.  Has tried patches, wellbutrin and hypnosis.  Again discussed with her today.  She desires to continue to smoke.  she declines flu shot and pneumonia shot.       Objective:   Physical Exam  Filed Vitals:   03/18/14 0857  BP: 120/80  Pulse: 72  Temp: 97.9 F (36.6 C)   Blood pressure recheck:  130/82, pulse 49  68 year old female in no acute distress.   HEENT:  Nares- clear.   Oropharynx - without lesions.   NECK:  Supple.  Nontender.  HEART:  Appears to be regular. LUNGS:  No crackles or wheezing audible.  Respirations even and unlabored.  RADIAL PULSE:  Equal bilaterally.   ABDOMEN:  Soft, nontender.  Bowel sounds present and normal.  No audible abdominal bruit.     EXTREMITIES:  No increased edema present.  DP pulses palpable and equal bilaterally.          Assessment & Plan:  HEALTH MAINTENANCE.  Physical 06/22/13.   Mammogram 05/30/13 - Birads I  (done at [facility]).  Already scheduled for a f/u mammogram.  Had colonoscopy 6/14 revealed diverticulosis and hemorrhoids.    I spent 25 minutes with the patient and more than 50% of the time was spent in consultation regarding the above.

## 2014-03-18 NOTE — Patient Instructions (Signed)
Align - one per day 

## 2014-03-19 ENCOUNTER — Encounter: Payer: Self-pay | Admitting: Internal Medicine

## 2014-03-19 NOTE — Assessment & Plan Note (Signed)
Have discussed with her the need to quit smoking.  She is not ready to quit.  Follow.

## 2014-03-19 NOTE — Assessment & Plan Note (Signed)
Recheck wnl.  Follow.  

## 2014-03-19 NOTE — Assessment & Plan Note (Signed)
Her cuff correlates.  Her outside checks are under good control.  Hold on medication.  Have her spot check her pressure.  Notify me if elevation.  Follow.  Better on her checks today.

## 2014-03-19 NOTE — Assessment & Plan Note (Signed)
Increased gas as outlined.  Worse after supper.  Eats a lot of fresh vegetables.  Instructed to see if possible triggers.  Add align daily.  Follow.  Bowels doing well.  Just had colonoscopy.

## 2014-03-19 NOTE — Assessment & Plan Note (Signed)
On Lipitor.  Low fat/low cholesterol diet and exercise.  Last lipid panel 11/08/13 revealed total cholesterol 204, triglycerides 254, HDL 58 and LDL 95.  Triglycerides increased.  [facility] Lipid diet.  Continue atorvastatin.  Follow.  Due for recheck.  Schedule lipid panel and liver function tests.

## 2014-03-19 NOTE — Assessment & Plan Note (Signed)
Colonoscopy 6/14 revealed diverticulosis and hemorrhoids.  Bowels doing well.

## 2014-03-26 ENCOUNTER — Other Ambulatory Visit (INDEPENDENT_AMBULATORY_CARE_PROVIDER_SITE_OTHER): Payer: Commercial Managed Care - HMO

## 2014-03-26 DIAGNOSIS — R030 Elevated blood-pressure reading, without diagnosis of hypertension: Secondary | ICD-10-CM

## 2014-03-26 DIAGNOSIS — IMO0001 Reserved for inherently not codable concepts without codable children: Secondary | ICD-10-CM

## 2014-03-26 DIAGNOSIS — E7800 Pure hypercholesterolemia, unspecified: Secondary | ICD-10-CM

## 2014-03-26 LAB — HEPATIC FUNCTION PANEL
ALT: 15 U/L (ref 0–35)
AST: 19 U/L (ref 0–37)
Albumin: 4.2 g/dL (ref 3.5–5.2)
Alkaline Phosphatase: 77 U/L (ref 39–117)
BILIRUBIN DIRECT: 0 mg/dL (ref 0.0–0.3)
BILIRUBIN TOTAL: 0.8 mg/dL (ref 0.2–1.2)
Total Protein: 7.3 g/dL (ref 6.0–8.3)

## 2014-03-26 LAB — LIPID PANEL
CHOLESTEROL: 190 mg/dL (ref 0–200)
HDL: 59.3 mg/dL (ref >39.00)
LDL Cholesterol: 96 mg/dL (ref 0–99)
NonHDL: 130.7
Total CHOL/HDL Ratio: 3
Triglycerides: 175 mg/dL — ABNORMAL HIGH (ref 0.0–149.0)
VLDL: 35 mg/dL (ref 0.0–40.0)

## 2014-03-26 LAB — BASIC METABOLIC PANEL
BUN: 16 mg/dL (ref 6–23)
CALCIUM: 9.3 mg/dL (ref 8.4–10.5)
CO2: 28 meq/L (ref 19–32)
Chloride: 106 mEq/L (ref 96–112)
Creatinine, Ser: 0.7 mg/dL (ref 0.4–1.2)
GFR: 86.47 mL/min (ref >60.00)
GLUCOSE: 92 mg/dL (ref 70–99)
Potassium: 4.5 mEq/L (ref 3.5–5.1)
Sodium: 140 mEq/L (ref 135–145)

## 2014-03-27 ENCOUNTER — Encounter: Payer: Self-pay | Admitting: *Deleted

## 2014-06-04 LAB — HM MAMMOGRAPHY: HM Mammogram: NEGATIVE

## 2014-06-06 ENCOUNTER — Encounter: Payer: Self-pay | Admitting: *Deleted

## 2014-07-23 ENCOUNTER — Encounter: Payer: Commercial Managed Care - HMO | Admitting: Internal Medicine

## 2014-07-24 ENCOUNTER — Telehealth: Payer: Self-pay | Admitting: *Deleted

## 2014-07-24 ENCOUNTER — Encounter: Payer: Self-pay | Admitting: Internal Medicine

## 2014-07-24 ENCOUNTER — Ambulatory Visit (INDEPENDENT_AMBULATORY_CARE_PROVIDER_SITE_OTHER): Payer: Commercial Managed Care - HMO | Admitting: Internal Medicine

## 2014-07-24 VITALS — BP 120/90 | HR 73 | Temp 97.8°F | Ht 62.5 in | Wt 150.5 lb

## 2014-07-24 DIAGNOSIS — Z720 Tobacco use: Secondary | ICD-10-CM

## 2014-07-24 DIAGNOSIS — R143 Flatulence: Secondary | ICD-10-CM

## 2014-07-24 DIAGNOSIS — E7800 Pure hypercholesterolemia, unspecified: Secondary | ICD-10-CM

## 2014-07-24 DIAGNOSIS — D75839 Thrombocytosis, unspecified: Secondary | ICD-10-CM

## 2014-07-24 DIAGNOSIS — D473 Essential (hemorrhagic) thrombocythemia: Secondary | ICD-10-CM

## 2014-07-24 DIAGNOSIS — IMO0001 Reserved for inherently not codable concepts without codable children: Secondary | ICD-10-CM

## 2014-07-24 DIAGNOSIS — R030 Elevated blood-pressure reading, without diagnosis of hypertension: Secondary | ICD-10-CM

## 2014-07-24 MED ORDER — ATORVASTATIN CALCIUM 20 MG PO TABS: 20.0000 mg | ORAL_TABLET | Freq: Every day | ORAL | Status: DC

## 2014-07-24 NOTE — Progress Notes (Signed)
Pre visit review using our clinic review tool, if applicable. No additional management support is needed unless otherwise documented below in the visit note. 

## 2014-07-24 NOTE — Telephone Encounter (Signed)
Opened in error

## 2014-07-24 NOTE — Progress Notes (Signed)
[name], female    DOB: 18-Dec-1945, 69 y.o.   MRN: [id number]  HPI 69 year old female with past history of tobacco abuse (with chronic bronchitis) and hypercholesterolemia who comes in today to follow up on these issues as well as for a complete physical exam.  States overall she has been doing relatively well.   No sob.  No chest tightness.  No increased cough or congestion.  We have discussed the need to stop smoking.   Again discussed this with her today.  She declines to stop at this time.  Stays active.  Bowels stable.  Gas much better with probiotic.  Overall she feels she is doing well.  Had colonoscopy 6/14.   Revealed diverticulosis and hemorrhoids.     Past Medical History  Diagnosis Date  . Migraines   . Hypercholesterolemia   . Chronic bronchitis     Current Outpatient Prescriptions on File Prior to Visit  Medication Sig Dispense Refill  . atorvastatin (LIPITOR) 20 MG tablet Take 1 tablet (20 mg total) by mouth daily. 90 tablet 3  . cholecalciferol (VITAMIN D) 400 UNITS TABS Take 400 Units by mouth daily.    . fish oil-omega-3 fatty acids 1000 MG capsule Take 1 g by mouth daily.    Marland Kitchen FLAXSEED, LINSEED, PO Take by mouth.     No current facility-administered medications on file prior to visit.    Review of Systems Patient denies any headache, lightheadedness or dizziness.  No significant sinus or allergy symptoms.  No chest pain, tightness or palpitations.  No increased shortness of breath.  No increased cough or congestion.  No nausea or vomiting.  No acid reflux.  No abdominal pain or cramping.  No bowel change, such as diarrhea, constipation, BRBPR or melana.  Gas better with probiotic.  No urine change.  Overall she feels she is doing well.  Have discussed the need to quit smoking.  Has tried patches, wellbutrin and hypnosis.  Again discussed with her today.  She desires to continue to smoke.  She declines flu shot and pneumonia shot.       Objective:   Physical Exam  Filed Vitals:   07/24/14 0931  BP: 120/90  Pulse: 73  Temp: 97.8 F (36.6 C)   Blood pressure recheck:  25/75-44  69 year old female in no acute distress.   HEENT:  Nares- clear.  Oropharynx - without lesions. NECK:  Supple.  Nontender.  No audible bruit.  HEART:  Appears to be regular. LUNGS:  No crackles or wheezing audible.  Respirations even and unlabored.  RADIAL PULSE:  Equal bilaterally.    BREASTS:  No nipple discharge or nipple retraction present.  Could not appreciate any distinct nodules or axillary adenopathy.  ABDOMEN:  Soft, nontender.  Bowel sounds present and normal.  No audible abdominal bruit.  GU:  Not performed.     EXTREMITIES:  No increased edema present.  DP pulses palpable and equal bilaterally.          Assessment & Plan:  1. Hypercholesterolemia Low cholesterol diet and exercise.  On lipitor.  Follow lipid panel and liver function tests.  Lab Results  Component Value Date   CHOL 190 03/26/2014   HDL 59.30 03/26/2014   LDLCALC 96 03/26/2014   LDLDIRECT 109.5 06/27/2013   TRIG 175.0* 03/26/2014   CHOLHDL 3 03/26/2014   2. Thrombocytosis Platelet count wnl in 11/2013.    3. Tobacco abuse Discussed with her  today regarding importance of not smoking.  She declines to stop.    4. Elevated blood pressure Blood pressure as outlined.  Have her spot check her pressure.  Get her back in soon to reassess.    5. Gas Better on probiotic.  Follow.    HEALTH MAINTENANCE.  Physical today   Mammogram 06/04/14 - Birads I  (done at [facility]).   Had colonoscopy 6/14 revealed diverticulosis and hemorrhoids.    I spent 25 minutes with the patient and more than 50% of the time was spent in consultation regarding the above.

## 2014-07-28 ENCOUNTER — Encounter: Payer: Self-pay | Admitting: Internal Medicine

## 2014-07-31 ENCOUNTER — Other Ambulatory Visit (INDEPENDENT_AMBULATORY_CARE_PROVIDER_SITE_OTHER): Payer: Commercial Managed Care - HMO

## 2014-07-31 DIAGNOSIS — IMO0001 Reserved for inherently not codable concepts without codable children: Secondary | ICD-10-CM

## 2014-07-31 DIAGNOSIS — R030 Elevated blood-pressure reading, without diagnosis of hypertension: Secondary | ICD-10-CM

## 2014-07-31 DIAGNOSIS — E7800 Pure hypercholesterolemia, unspecified: Secondary | ICD-10-CM

## 2014-07-31 LAB — LIPID PANEL
Cholesterol: 195 mg/dL (ref 0–200)
HDL: 68.4 mg/dL (ref >39.00)
LDL Cholesterol: 91 mg/dL (ref 0–99)
NonHDL: 126.6
TRIGLYCERIDES: 178 mg/dL — AB (ref 0.0–149.0)
Total CHOL/HDL Ratio: 3
VLDL: 35.6 mg/dL (ref 0.0–40.0)

## 2014-07-31 LAB — HEPATIC FUNCTION PANEL
ALK PHOS: 85 U/L (ref 39–117)
ALT: 16 U/L (ref 0–35)
AST: 17 U/L (ref 0–37)
Albumin: 4.4 g/dL (ref 3.5–5.2)
Bilirubin, Direct: 0.1 mg/dL (ref 0.0–0.3)
TOTAL PROTEIN: 7.5 g/dL (ref 6.0–8.3)
Total Bilirubin: 0.5 mg/dL (ref 0.2–1.2)

## 2014-07-31 LAB — BASIC METABOLIC PANEL
BUN: 17 mg/dL (ref 6–23)
CO2: 28 meq/L (ref 19–32)
Calcium: 9.5 mg/dL (ref 8.4–10.5)
Chloride: 107 mEq/L (ref 96–112)
Creatinine, Ser: 0.7 mg/dL (ref 0.40–1.20)
GFR: 87.81 mL/min (ref >60.00)
Glucose, Bld: 91 mg/dL (ref 70–99)
Potassium: 4.2 mEq/L (ref 3.5–5.1)
Sodium: 140 mEq/L (ref 135–145)

## 2014-08-01 ENCOUNTER — Encounter: Payer: Self-pay | Admitting: *Deleted

## 2014-10-14 ENCOUNTER — Ambulatory Visit (INDEPENDENT_AMBULATORY_CARE_PROVIDER_SITE_OTHER): Payer: Commercial Managed Care - HMO | Admitting: Nurse Practitioner

## 2014-10-14 ENCOUNTER — Encounter: Payer: Self-pay | Admitting: Nurse Practitioner

## 2014-10-14 ENCOUNTER — Telehealth: Payer: Self-pay | Admitting: Internal Medicine

## 2014-10-14 VITALS — BP 120/86 | HR 78 | Temp 97.4°F | Resp 14 | Ht 63.5 in | Wt 147.0 lb

## 2014-10-14 DIAGNOSIS — B9789 Other viral agents as the cause of diseases classified elsewhere: Principal | ICD-10-CM

## 2014-10-14 DIAGNOSIS — J069 Acute upper respiratory infection, unspecified: Secondary | ICD-10-CM

## 2014-10-14 NOTE — Progress Notes (Signed)
   [name], female    DOB: 06-10-1946, 69 y.o.   MRN: [id number]  HPI  Monica Morales is a 69 yo female with a CC of cough.   1) Minute clinic last Sunday   Dx: Possible bronchitis- coughing was main concern when she went Doxycyline last dose tomorrow and finished prednisone taper Thurs. Morning.  Today's symptoms: Coughing up some mucous.  Still smoking up to 1/2 ppd   Reports a buzzing in her ears that is slight and a "numby feeling" of her gums and tongue since this started. She reports decrease sense of taste or smell that has been going on since this started. Denies taking ASA or other salicylates.   Nothing in 3-4 days (prescription cough syrup or mucinex) Benadryl a couple nights last week -Helpful   Review of Systems  Constitutional: Negative for fever, chills, diaphoresis and fatigue.  HENT: Positive for congestion, sneezing and tinnitus. Negative for ear discharge, ear pain, sinus pressure and sore throat.   Eyes: Negative for visual disturbance.  Respiratory: Positive for cough. Negative for chest tightness, shortness of breath and wheezing.   Cardiovascular: Negative for chest pain, palpitations and leg swelling.  Gastrointestinal: Negative for nausea, vomiting and diarrhea.  Skin: Negative for rash.  Neurological: Negative for dizziness, weakness, numbness and headaches.      Objective:   Physical Exam  Constitutional: She is oriented to person, place, and time. She appears well-developed and well-nourished. No distress.  BP 120/86 mmHg  Pulse 78  Temp(Src) 97.4 F (36.3 C) (Oral)  Resp 14  Ht 5' 3.5" (1.613 m)  Wt 147 lb (66.679 kg)  BMI 25.63 kg/m2  SpO2 97%   HENT:  Head: Normocephalic and atraumatic.  Right Ear: External ear normal.  Left Ear: External ear normal.  TM's clear bilaterally   Eyes: Right eye exhibits no discharge. Left eye exhibits no discharge. No scleral icterus.  Neck: Normal range of motion. Neck  supple. No thyromegaly present.  Cardiovascular: Normal rate, regular rhythm and normal heart sounds.  Exam reveals no gallop and no friction rub.   No murmur heard. Pulmonary/Chest: Effort normal and breath sounds normal. No respiratory distress. She has no wheezes. She has no rales. She exhibits no tenderness.  Lymphadenopathy:    She has no cervical adenopathy.  Neurological: She is alert and oriented to person, place, and time. No cranial nerve deficit. She exhibits normal muscle tone. Coordination normal.  Cranial nerves 3,4,5,7,8, 11, and 12 intact.   Skin: Skin is warm and dry. No rash noted. She is not diaphoretic.  Psychiatric: She has a normal mood and affect. Her behavior is normal. Judgment and thought content normal.      Assessment & Plan:

## 2014-10-14 NOTE — Progress Notes (Signed)
Pre visit review using our clinic review tool, if applicable. No additional management support is needed unless otherwise documented below in the visit note. 

## 2014-10-14 NOTE — Assessment & Plan Note (Signed)
Improving. She was concerned about her cough not being gone and the feelings of her mouth tongue. Reassured this could be from sinusitis or from viral illness. Mucinex daily, cough syrup or benadryl at night, Claritin/Allegra/or Zyrtec during the day, and simply saline nasal rinse. FU prn worsening/failure to improve.

## 2014-10-14 NOTE — Telephone Encounter (Signed)
emmi mailed  °

## 2014-10-14 NOTE — Patient Instructions (Signed)
Please take the Mucinex daily   Benadryl and cough syrup at night time helpful.  Zyrtec/Allegra/ or Claritin during the day.   Nasal rinses with simply saline.

## 2014-11-27 ENCOUNTER — Ambulatory Visit (INDEPENDENT_AMBULATORY_CARE_PROVIDER_SITE_OTHER): Payer: Commercial Managed Care - HMO | Admitting: Internal Medicine

## 2014-11-27 ENCOUNTER — Encounter: Payer: Self-pay | Admitting: Internal Medicine

## 2014-11-27 VITALS — BP 123/78 | HR 72 | Temp 97.5°F | Ht 63.5 in | Wt 147.5 lb

## 2014-11-27 DIAGNOSIS — D473 Essential (hemorrhagic) thrombocythemia: Secondary | ICD-10-CM | POA: Diagnosis not present

## 2014-11-27 DIAGNOSIS — IMO0001 Reserved for inherently not codable concepts without codable children: Secondary | ICD-10-CM

## 2014-11-27 DIAGNOSIS — E7800 Pure hypercholesterolemia, unspecified: Secondary | ICD-10-CM

## 2014-11-27 DIAGNOSIS — Z720 Tobacco use: Secondary | ICD-10-CM | POA: Diagnosis not present

## 2014-11-27 DIAGNOSIS — R030 Elevated blood-pressure reading, without diagnosis of hypertension: Secondary | ICD-10-CM | POA: Diagnosis not present

## 2014-11-27 DIAGNOSIS — D75839 Thrombocytosis, unspecified: Secondary | ICD-10-CM

## 2014-11-27 DIAGNOSIS — M79671 Pain in right foot: Secondary | ICD-10-CM

## 2014-11-27 DIAGNOSIS — M79672 Pain in left foot: Secondary | ICD-10-CM

## 2014-11-27 NOTE — Progress Notes (Signed)
Pre visit review using our clinic review tool, if applicable. No additional management support is needed unless otherwise documented below in the visit note. 

## 2014-11-27 NOTE — Progress Notes (Signed)
[name], female   DOB: Dec 22, 1945, 69 y.o.   MRN: [id number]   [name], female    DOB: 1946/02/09, 69 y.o.   MRN: [id number]  HPI  Patient here for a scheduled follow up.  Reports breathing is stable.  Still smoking.  Discussed the need to quit smoking.  She has cut back on her cigarettes.  No cardiac symptoms with increased activity or exertion.  Stays active.  Eating and drinking well.  No bowel change.  Does report bilateral foot pain.  Has a history of plantar fasciitis.  Describes pain all over her feet.  Request referral to podiatry.     Past Medical History  Diagnosis Date  . Migraines   . Hypercholesterolemia   . Chronic bronchitis     Current Outpatient Prescriptions on File Prior to Visit  Medication Sig Dispense Refill  . atorvastatin (LIPITOR) 20 MG tablet Take 1 tablet (20 mg total) by mouth daily. 90 tablet 3   No current facility-administered medications on file prior to visit.    Review of Systems  Constitutional: Negative for appetite change and unexpected weight change.  HENT: Negative for congestion and sinus pressure.   Respiratory: Negative for cough, chest tightness and shortness of breath.   Cardiovascular: Negative for chest pain, palpitations and leg swelling.  Gastrointestinal: Negative for nausea, vomiting, abdominal pain and diarrhea.  Musculoskeletal:       Foot pain as outlined.    Neurological: Negative for dizziness, light-headedness and headaches.  Psychiatric/Behavioral: Negative for dysphoric mood and agitation.       Objective:    Physical Exam  Constitutional: She appears well-developed and well-nourished. No distress.  HENT:  Nose: Nose normal.  Mouth/Throat: Oropharynx is clear and moist.  Neck: Neck supple. No thyromegaly present.  Cardiovascular: Normal rate and regular rhythm.   Pulmonary/Chest: Breath sounds normal. No respiratory distress. She has no wheezes.  Abdominal:  Soft. Bowel sounds are normal. There is no tenderness.  Musculoskeletal: She exhibits no edema or tenderness.  Lymphadenopathy:    She has no cervical adenopathy.  Psychiatric: She has a normal mood and affect. Her behavior is normal.    BP 123/78 mmHg  Pulse 72  Temp(Src) 97.5 F (36.4 C) (Oral)  Ht 5' 3.5" (1.613 m)  Wt 147 lb 8 oz (66.906 kg)  BMI 25.72 kg/m2  SpO2 98% Wt Readings from Last 3 Encounters:  11/27/14 147 lb 8 oz (66.906 kg)  10/14/14 147 lb (66.679 kg)  07/24/14 150 lb 8 oz (68.266 kg)     Lab Results  Component Value Date   WBC 8.8 11/08/2013   HGB 14.7 11/08/2013   HCT 42.9 11/08/2013   PLT 396 11/26/2013   GLUCOSE 91 07/31/2014   CHOL 195 07/31/2014   TRIG 178.0* 07/31/2014   HDL 68.40 07/31/2014   LDLDIRECT 109.5 06/27/2013   LDLCALC 91 07/31/2014   ALT 16 07/31/2014   AST 17 07/31/2014   NA 140 07/31/2014   K 4.2 07/31/2014   CL 107 07/31/2014   CREATININE 0.70 07/31/2014   BUN 17 07/31/2014   CO2 28 07/31/2014   TSH 3.11 11/08/2013       Assessment & Plan:   Problem List Items Addressed This Visit    Elevated blood pressure    Blood pressure as outlined.  Follow.        Relevant Orders   Basic metabolic panel   Foot pain, bilateral  Bilateral foot pain.  Request referral to Dr Milinda Pointer.        Relevant Orders   Ambulatory referral to Podiatry   Hypercholesterolemia    On lipitor.  Low cholesterol diet and exercise.  Follow lipid panel and liver function tests.       Relevant Orders   Lipid panel   Hepatic function panel   TSH   Thrombocytosis    Follow platelet count.        Relevant Orders   CBC with Differential/Platelet   Tobacco abuse - Primary    Discussed the need to quit.  Has cut back.  Follow.           Einar Pheasant, MD

## 2014-12-01 ENCOUNTER — Encounter: Payer: Self-pay | Admitting: Internal Medicine

## 2014-12-01 DIAGNOSIS — M79672 Pain in left foot: Secondary | ICD-10-CM

## 2014-12-01 DIAGNOSIS — M79671 Pain in right foot: Secondary | ICD-10-CM | POA: Insufficient documentation

## 2014-12-01 NOTE — Assessment & Plan Note (Signed)
On lipitor.  Low cholesterol diet and exercise.  Follow lipid panel and liver function tests.   

## 2014-12-01 NOTE — Assessment & Plan Note (Signed)
Bilateral foot pain.  Request referral to Dr Milinda Pointer.

## 2014-12-01 NOTE — Assessment & Plan Note (Signed)
Blood pressure as outlined.  Follow.   

## 2014-12-01 NOTE — Assessment & Plan Note (Signed)
Follow platelet count.   

## 2014-12-01 NOTE — Assessment & Plan Note (Signed)
Discussed the need to quit.  Has cut back.  Follow.

## 2014-12-30 ENCOUNTER — Ambulatory Visit (INDEPENDENT_AMBULATORY_CARE_PROVIDER_SITE_OTHER): Payer: Commercial Managed Care - HMO | Admitting: Podiatry

## 2014-12-30 ENCOUNTER — Ambulatory Visit (INDEPENDENT_AMBULATORY_CARE_PROVIDER_SITE_OTHER): Payer: Commercial Managed Care - HMO

## 2014-12-30 ENCOUNTER — Encounter: Payer: Self-pay | Admitting: Podiatry

## 2014-12-30 VITALS — BP 142/91 | HR 77 | Resp 16

## 2014-12-30 DIAGNOSIS — M79673 Pain in unspecified foot: Secondary | ICD-10-CM

## 2014-12-30 DIAGNOSIS — M722 Plantar fascial fibromatosis: Secondary | ICD-10-CM | POA: Diagnosis not present

## 2014-12-30 MED ORDER — DICLOFENAC SODIUM 75 MG PO TBEC: 75.0000 mg | DELAYED_RELEASE_TABLET | Freq: Two times a day (BID) | ORAL | Status: DC

## 2014-12-30 MED ORDER — METHYLPREDNISOLONE 4 MG PO TBPK: ORAL_TABLET | ORAL | Status: DC

## 2014-12-30 NOTE — Progress Notes (Signed)
   [name], female    DOB: November 02, 1945, 69 y.o.   MRN: [id number]  HPI Comments: "I have pain in my feet"  Patient c/o aching forefoot bilateral for several months. She does have AM pain. She has had problems with plantar fasciitis before, but those issues were more in the heels. Sometimes feels some "twinges" in her toes. She has tried icing and Ibuprofen-no help.  Foot Pain      Review of Systems  All other systems reviewed and are negative.      Objective:   Physical Exam  Musculoskeletal:       Feet:   I have reviewed her past medical history medications allergies surgery social history and review of systems. Pulses are palpable bilateral. Neurologic sensorium is intact per Semmes-Weinstein monofilament. Deep tendon reflexes intact bilateral muscle strength +5 over 5 dorsiflexion plantar flexors and inverters everters on physical musculatures intact. Orthopedic evaluation was resolved joints distal to the ankle for range of motion without crepitation. Pain on palpation medial calcaneal tubercle bilateral. Radiographs confirm soft tissue increase in density plantar fascial calcaneal insertion site.        Assessment & Plan:  Assessment: Plantar fasciitis bilateral.  Plan: Discussed etiology pathology conservative versus surgical therapies. At this point injected the bilateral heels. Her on a Medrol Dosepak to be followed by diclofenac 75 mg 1 by mouth twice a day. Plantar fascial braces were prescribed and she will continue the use of a plantar fascial night splint. We discussed appropriate shoe gear stretching exercises ice therapy shoe gear modifications. I will follow-up with her 1 month

## 2014-12-30 NOTE — Patient Instructions (Signed)

## 2015-01-06 ENCOUNTER — Other Ambulatory Visit: Payer: Self-pay | Admitting: Internal Medicine

## 2015-01-06 ENCOUNTER — Other Ambulatory Visit (INDEPENDENT_AMBULATORY_CARE_PROVIDER_SITE_OTHER): Payer: Commercial Managed Care - HMO

## 2015-01-06 DIAGNOSIS — D75839 Thrombocytosis, unspecified: Secondary | ICD-10-CM

## 2015-01-06 DIAGNOSIS — D72829 Elevated white blood cell count, unspecified: Secondary | ICD-10-CM

## 2015-01-06 DIAGNOSIS — R030 Elevated blood-pressure reading, without diagnosis of hypertension: Secondary | ICD-10-CM

## 2015-01-06 DIAGNOSIS — E7800 Pure hypercholesterolemia, unspecified: Secondary | ICD-10-CM

## 2015-01-06 DIAGNOSIS — D473 Essential (hemorrhagic) thrombocythemia: Secondary | ICD-10-CM | POA: Diagnosis not present

## 2015-01-06 DIAGNOSIS — IMO0001 Reserved for inherently not codable concepts without codable children: Secondary | ICD-10-CM

## 2015-01-06 LAB — CBC WITH DIFFERENTIAL/PLATELET
Basophils Absolute: 0.1 10*3/uL (ref 0.0–0.1)
Basophils Relative: 0.5 % (ref 0.0–3.0)
EOS ABS: 0.4 10*3/uL (ref 0.0–0.7)
EOS PCT: 3.3 % (ref 0.0–5.0)
HEMATOCRIT: 41.9 % (ref 36.0–46.0)
Hemoglobin: 14.1 g/dL (ref 12.0–15.0)
LYMPHS ABS: 3.3 10*3/uL (ref 0.7–4.0)
Lymphocytes Relative: 26.3 % (ref 12.0–46.0)
MCHC: 33.7 g/dL (ref 30.0–36.0)
MCV: 93.6 fl (ref 78.0–100.0)
MONO ABS: 1 10*3/uL (ref 0.1–1.0)
Monocytes Relative: 7.7 % (ref 3.0–12.0)
Neutro Abs: 7.8 10*3/uL — ABNORMAL HIGH (ref 1.4–7.7)
Neutrophils Relative %: 62.2 % (ref 43.0–77.0)
PLATELETS: 431 10*3/uL — AB (ref 150.0–400.0)
RBC: 4.48 Mil/uL (ref 3.87–5.11)
RDW: 13.5 % (ref 11.5–15.5)
WBC: 12.5 10*3/uL — AB (ref 4.0–10.5)

## 2015-01-06 LAB — BASIC METABOLIC PANEL
BUN: 18 mg/dL (ref 6–23)
CALCIUM: 9.8 mg/dL (ref 8.4–10.5)
CO2: 31 meq/L (ref 19–32)
CREATININE: 0.75 mg/dL (ref 0.40–1.20)
Chloride: 103 mEq/L (ref 96–112)
GFR: 80.99 mL/min (ref >60.00)
Glucose, Bld: 77 mg/dL (ref 70–99)
Potassium: 4.7 mEq/L (ref 3.5–5.1)
Sodium: 139 mEq/L (ref 135–145)

## 2015-01-06 LAB — TSH: TSH: 2.82 u[IU]/mL (ref 0.35–4.50)

## 2015-01-06 LAB — HEPATIC FUNCTION PANEL
ALK PHOS: 85 U/L (ref 39–117)
ALT: 15 U/L (ref 0–35)
AST: 13 U/L (ref 0–37)
Albumin: 4.3 g/dL (ref 3.5–5.2)
Bilirubin, Direct: 0.1 mg/dL (ref 0.0–0.3)
TOTAL PROTEIN: 6.6 g/dL (ref 6.0–8.3)
Total Bilirubin: 0.7 mg/dL (ref 0.2–1.2)

## 2015-01-06 LAB — LIPID PANEL
Cholesterol: 186 mg/dL (ref 0–200)
HDL: 64.1 mg/dL (ref >39.00)
NonHDL: 121.9
Total CHOL/HDL Ratio: 3
Triglycerides: 228 mg/dL — ABNORMAL HIGH (ref 0.0–149.0)
VLDL: 45.6 mg/dL — ABNORMAL HIGH (ref 0.0–40.0)

## 2015-01-06 LAB — LDL CHOLESTEROL, DIRECT: Direct LDL: 75 mg/dL

## 2015-01-06 NOTE — Progress Notes (Signed)
Order placed for f/u cbc.   

## 2015-01-07 ENCOUNTER — Encounter: Payer: Self-pay | Admitting: *Deleted

## 2015-01-21 ENCOUNTER — Other Ambulatory Visit (INDEPENDENT_AMBULATORY_CARE_PROVIDER_SITE_OTHER): Payer: Commercial Managed Care - HMO

## 2015-01-21 DIAGNOSIS — D72829 Elevated white blood cell count, unspecified: Secondary | ICD-10-CM | POA: Diagnosis not present

## 2015-01-21 LAB — CBC WITH DIFFERENTIAL/PLATELET
BASOS PCT: 0.4 % (ref 0.0–3.0)
Basophils Absolute: 0 10*3/uL (ref 0.0–0.1)
Eosinophils Absolute: 0.2 10*3/uL (ref 0.0–0.7)
Eosinophils Relative: 2.3 % (ref 0.0–5.0)
HCT: 41.4 % (ref 36.0–46.0)
Hemoglobin: 13.8 g/dL (ref 12.0–15.0)
Lymphocytes Relative: 31.7 % (ref 12.0–46.0)
Lymphs Abs: 2.8 10*3/uL (ref 0.7–4.0)
MCHC: 33.4 g/dL (ref 30.0–36.0)
MCV: 94.3 fl (ref 78.0–100.0)
MONOS PCT: 7.1 % (ref 3.0–12.0)
Monocytes Absolute: 0.6 10*3/uL (ref 0.1–1.0)
NEUTROS PCT: 58.5 % (ref 43.0–77.0)
Neutro Abs: 5.1 10*3/uL (ref 1.4–7.7)
PLATELETS: 386 10*3/uL (ref 150.0–400.0)
RBC: 4.39 Mil/uL (ref 3.87–5.11)
RDW: 13.7 % (ref 11.5–15.5)
WBC: 8.8 10*3/uL (ref 4.0–10.5)

## 2015-01-22 ENCOUNTER — Encounter: Payer: Self-pay | Admitting: *Deleted

## 2015-02-03 ENCOUNTER — Ambulatory Visit: Payer: Commercial Managed Care - HMO | Admitting: Podiatry

## 2015-02-10 ENCOUNTER — Encounter: Payer: Self-pay | Admitting: Podiatry

## 2015-02-10 ENCOUNTER — Ambulatory Visit (INDEPENDENT_AMBULATORY_CARE_PROVIDER_SITE_OTHER): Payer: Commercial Managed Care - HMO | Admitting: Podiatry

## 2015-02-10 VITALS — BP 130/87 | HR 83 | Resp 16

## 2015-02-10 DIAGNOSIS — M722 Plantar fascial fibromatosis: Secondary | ICD-10-CM

## 2015-02-10 NOTE — Progress Notes (Signed)
She presents today for follow-up of her plantar fasciitis. She states that he has improved tremendously however it is still painful at times. She states that she is unable to take the non-steroidal's on a regular basis because of stomach upset.  Objective: Vital signs are stable she is alert and oriented 3 she presents in a pair flip-flops with her plantar fascial braces. She has pain on palpation medial calcaneal tubercles bilateral. Strong palpable pulses bilateral pain.  Assessment: Plantar fasciitis resolving in nature bilateral.  Plan: Injected bilateral heels today with Kenalog and local and aesthetic we will follow-up with her in 1 month at which time we will consider orthosis.

## 2015-03-10 ENCOUNTER — Ambulatory Visit: Payer: Commercial Managed Care - HMO | Admitting: Podiatry

## 2015-04-01 ENCOUNTER — Encounter (INDEPENDENT_AMBULATORY_CARE_PROVIDER_SITE_OTHER): Payer: Self-pay

## 2015-04-01 ENCOUNTER — Encounter: Payer: Self-pay | Admitting: Internal Medicine

## 2015-04-01 ENCOUNTER — Ambulatory Visit (INDEPENDENT_AMBULATORY_CARE_PROVIDER_SITE_OTHER): Payer: Commercial Managed Care - HMO | Admitting: Internal Medicine

## 2015-04-01 VITALS — BP 128/80 | HR 75 | Temp 97.7°F | Ht 63.5 in | Wt 149.5 lb

## 2015-04-01 DIAGNOSIS — R030 Elevated blood-pressure reading, without diagnosis of hypertension: Secondary | ICD-10-CM

## 2015-04-01 DIAGNOSIS — D473 Essential (hemorrhagic) thrombocythemia: Secondary | ICD-10-CM

## 2015-04-01 DIAGNOSIS — Z720 Tobacco use: Secondary | ICD-10-CM | POA: Diagnosis not present

## 2015-04-01 DIAGNOSIS — D75839 Thrombocytosis, unspecified: Secondary | ICD-10-CM

## 2015-04-01 DIAGNOSIS — E7800 Pure hypercholesterolemia, unspecified: Secondary | ICD-10-CM

## 2015-04-01 DIAGNOSIS — IMO0001 Reserved for inherently not codable concepts without codable children: Secondary | ICD-10-CM

## 2015-04-01 NOTE — Progress Notes (Signed)
[name], female   DOB: 1946/06/07, 69 y.o.   MRN: [id number]   [name], female    DOB: 1945/11/01, 69 y.o.   MRN: [id number]  HPI  Patient here to follow up on her elevated cholesterol and previous congestion and cough.  Her cough and congestion is better.  Taking atorvastatin.  Tries to stay active.  Her plantar fasciitis limits her some.  Seeing Dr Scherrie November.  Doing stretches.  Has had two injections.  No chest pain or tightness.  No abdominal pain or cramping.  Bowels stable.     Past Medical History  Diagnosis Date  . Migraines   . Hypercholesterolemia   . Chronic bronchitis    Past Surgical History  Procedure Laterality Date  . Breast biopsy  1979    benign  . Abdominal hysterectomy  1979    secondary to fibroids, incidental appendectomy  . Tonsillectomy  1960   Family History  Problem Relation Age of Onset  . Heart disease Father     myocardial infarction - died age 52  . Heart disease Mother   . Diabetes Mother   . Diabetes Maternal Grandmother   . Breast cancer Neg Hx   . Colon cancer Neg Hx    Social History   Social History  . Marital Status: Married    Spouse Name: N/A  . Number of Children: 1  . Years of Education: N/A   Social History Main Topics  . Smoking status: Current Every Day Smoker -- 1.00 packs/day  . Smokeless tobacco: Never Used     Comment: is going to try electronic cigarettes  . Alcohol Use: No  . Drug Use: No  . Sexual Activity: Not Asked   Other Topics Concern  . None   Social History Narrative    Outpatient Encounter Prescriptions as of 04/01/2015  Medication Sig  . atorvastatin (LIPITOR) 20 MG tablet Take 1 tablet (20 mg total) by mouth daily.  . [DISCONTINUED] diclofenac (VOLTAREN) 75 MG EC tablet Take 1 tablet (75 mg total) by mouth 2 (two) times daily.   No facility-administered encounter medications on file as of 04/01/2015.    Review of Systems  Constitutional:  Negative for appetite change and unexpected weight change.  HENT: Negative for congestion and sinus pressure.   Eyes: Negative for pain and visual disturbance.  Respiratory: Positive for cough (cough and congestion better.  ). Negative for chest tightness and shortness of breath.   Cardiovascular: Negative for chest pain, palpitations and leg swelling.  Gastrointestinal: Negative for nausea, vomiting, abdominal pain and diarrhea.  Genitourinary: Negative for dysuria and difficulty urinating.  Musculoskeletal:       Foot pain - fasciitis.  Limits her some in her activity.   Skin: Negative for color change and rash.  Neurological: Negative for dizziness, light-headedness and headaches.  Psychiatric/Behavioral: Negative for dysphoric mood and agitation.       Objective:    Physical Exam  Constitutional: She appears well-developed and well-nourished. No distress.  HENT:  Nose: Nose normal.  Mouth/Throat: Oropharynx is clear and moist.  Eyes: Conjunctivae are normal. Right eye exhibits no discharge. Left eye exhibits no discharge.  Neck: Neck supple. No thyromegaly present.  Cardiovascular: Normal rate and regular rhythm.   Pulmonary/Chest: Breath sounds normal. No respiratory distress. She has no wheezes.  Abdominal: Soft. Bowel sounds are normal. There is no tenderness.  Musculoskeletal: She exhibits no edema or tenderness.  Lymphadenopathy:  She has no cervical adenopathy.  Skin: No rash noted. No erythema.  Psychiatric: She has a normal mood and affect. Her behavior is normal.    BP 128/80 mmHg  Pulse 75  Temp(Src) 97.7 F (36.5 C) (Oral)  Ht 5' 3.5" (1.613 m)  Wt 149 lb 8 oz (67.813 kg)  BMI 26.06 kg/m2  SpO2 97% Wt Readings from Last 3 Encounters:  04/01/15 149 lb 8 oz (67.813 kg)  11/27/14 147 lb 8 oz (66.906 kg)  10/14/14 147 lb (66.679 kg)     Lab Results  Component Value Date   WBC 8.8 01/21/2015   HGB 13.8 01/21/2015   HCT 41.4 01/21/2015   PLT 386.0  01/21/2015   GLUCOSE 77 01/06/2015   CHOL 186 01/06/2015   TRIG 228.0* 01/06/2015   HDL 64.10 01/06/2015   LDLDIRECT 75.0 01/06/2015   LDLCALC 91 07/31/2014   ALT 15 01/06/2015   AST 13 01/06/2015   NA 139 01/06/2015   K 4.7 01/06/2015   CL 103 01/06/2015   CREATININE 0.75 01/06/2015   BUN 18 01/06/2015   CO2 31 01/06/2015   TSH 2.82 01/06/2015       Assessment & Plan:   Problem List Items Addressed This Visit    Elevated blood pressure    Blood pressure as outlined.  Follow.  On no medication.        Relevant Orders   Basic metabolic panel   Hypercholesterolemia    On atorvastatin.  Low cholesterol diet and exercise.  Follow lipid panel and liver function tests.       Relevant Orders   Lipid panel   Hepatic function panel   Thrombocytosis - Primary    Platelets wnl 01/21/15.  Follow.        Tobacco abuse    Discussed the need to quit smoking.  She desires to continue to smoke.  Discussed screening CT.  She wants to hold at this time.  Follow.            Einar Pheasant, MD

## 2015-04-01 NOTE — Progress Notes (Signed)
Pre-visit discussion using our clinic review tool. No additional management support is needed unless otherwise documented below in the visit note.  

## 2015-04-06 ENCOUNTER — Encounter: Payer: Self-pay | Admitting: Internal Medicine

## 2015-04-06 NOTE — Assessment & Plan Note (Signed)
Platelets wnl 01/21/15.  Follow.

## 2015-04-06 NOTE — Assessment & Plan Note (Signed)
On atorvastatin.  Low cholesterol diet and exercise.  Follow lipid panel and liver function tests.  

## 2015-04-06 NOTE — Assessment & Plan Note (Signed)
Discussed the need to quit smoking.  She desires to continue to smoke.  Discussed screening CT.  She wants to hold at this time.  Follow.

## 2015-04-06 NOTE — Assessment & Plan Note (Signed)
Blood pressure as outlined.  Follow.  On no medication.

## 2015-05-28 ENCOUNTER — Encounter: Payer: Self-pay | Admitting: Nurse Practitioner

## 2015-05-28 ENCOUNTER — Ambulatory Visit (INDEPENDENT_AMBULATORY_CARE_PROVIDER_SITE_OTHER): Payer: Commercial Managed Care - HMO | Admitting: Nurse Practitioner

## 2015-05-28 VITALS — BP 126/70 | HR 86 | Temp 98.0°F | Wt 149.0 lb

## 2015-05-28 DIAGNOSIS — J069 Acute upper respiratory infection, unspecified: Secondary | ICD-10-CM | POA: Diagnosis not present

## 2015-05-28 DIAGNOSIS — B9789 Other viral agents as the cause of diseases classified elsewhere: Principal | ICD-10-CM

## 2015-05-28 MED ORDER — AZITHROMYCIN 250 MG PO TABS: ORAL_TABLET | ORAL | Status: DC

## 2015-05-28 MED ORDER — GUAIFENESIN-CODEINE 100-10 MG/5ML PO SYRP: 5.0000 mL | ORAL_SOLUTION | Freq: Every day | ORAL | Status: DC

## 2015-05-28 MED ORDER — BENZONATATE 100 MG PO CAPS: 100.0000 mg | ORAL_CAPSULE | Freq: Two times a day (BID) | ORAL | Status: DC | PRN

## 2015-05-28 NOTE — Progress Notes (Signed)
[name], female    DOB: [birth year]  Age: 69 y.o. MRN: [id number]  CC: Cough   HPI DURU REIGER presents for CC of cough x 1 week.   1) 1 week of cough with clear mucous. Worsening, not improving. Pt has had hx of CAP and is a smoker. Not ready to quit today.   Treatment to date: Mucinex and allegra- with some relief  Sick contacts husband and granddaughter who is with her today.   History Audrinna has a past medical history of Migraines; Hypercholesterolemia; and Chronic bronchitis ([place]).   She has past surgical history that includes Breast biopsy (1979); Abdominal hysterectomy (1979); and Tonsillectomy (1960).   Her family history includes Diabetes in her maternal grandmother and mother; Heart disease in her father and mother. There is no history of Breast cancer or Colon cancer.She reports that she has been smoking.  She has never used smokeless tobacco. She reports that she does not drink alcohol or use illicit drugs.  Outpatient Prescriptions Prior to Visit  Medication Sig Dispense Refill  . atorvastatin (LIPITOR) 20 MG tablet Take 1 tablet (20 mg total) by mouth daily. 90 tablet 3   No facility-administered medications prior to visit.    ROS Review of Systems  Constitutional: Negative for fever, chills, diaphoresis and fatigue.  HENT: Positive for congestion, postnasal drip, rhinorrhea and sinus pressure. Negative for ear discharge, ear pain, sneezing, sore throat, trouble swallowing and voice change.   Eyes: Negative for visual disturbance.  Respiratory: Positive for cough. Negative for chest tightness, shortness of breath and wheezing.   Cardiovascular: Negative for chest pain, palpitations and leg swelling.  Gastrointestinal: Negative for nausea, vomiting and diarrhea.  Skin: Negative for rash.  Neurological: Negative for dizziness, weakness, numbness and headaches.  Psychiatric/Behavioral: The patient is not nervous/anxious.     Objective:  BP  126/70 mmHg  Pulse 86  Temp(Src) 98 F (36.7 C) (Oral)  Wt 149 lb (67.586 kg)  SpO2 94%  Physical Exam  Constitutional: She is oriented to person, place, and time. She appears well-developed and well-nourished. No distress.  HENT:  Head: Normocephalic and atraumatic.  Right Ear: External ear normal.  Left Ear: External ear normal.  TM's clear  Eyes: Right eye exhibits no discharge. Left eye exhibits no discharge. No scleral icterus.  Neck: Normal range of motion. Neck supple.  Cardiovascular: Normal rate and regular rhythm.   Pulmonary/Chest: Effort normal and breath sounds normal. No respiratory distress. She has no wheezes. She has no rales. She exhibits no tenderness.  Lymphadenopathy:    She has no cervical adenopathy.  Neurological: She is alert and oriented to person, place, and time. No cranial nerve deficit. She exhibits normal muscle tone. Coordination normal.  Skin: Skin is warm and dry. No rash noted. She is not diaphoretic.  Psychiatric: She has a normal mood and affect. Her behavior is normal. Judgment and thought content normal.   Assessment & Plan:   Tylynn was seen today for cough.  Diagnoses and all orders for this visit:  Viral URI with cough  Other orders -     azithromycin (ZITHROMAX) 250 MG tablet; Take 2 tablets by mouth on day 1, take 1 tablet by mouth each day after for 4 days. -     benzonatate (TESSALON) 100 MG capsule; Take 1 capsule (100 mg total) by mouth 2 (two) times daily as needed for cough. -     guaiFENesin-codeine (ROBITUSSIN AC) 100-10 MG/5ML syrup; Take 5  mLs by mouth at bedtime.  I am having Ms. Manus start on azithromycin, benzonatate, and guaiFENesin-codeine. I am also having her maintain her atorvastatin.  Meds ordered this encounter  Medications  . azithromycin (ZITHROMAX) 250 MG tablet    Sig: Take 2 tablets by mouth on day 1, take 1 tablet by mouth each day after for 4 days.    Dispense:  6 each    Refill:  0    Order  Specific Question:  Supervising Provider    Answer:  Deborra Medina L [[id number]]  . benzonatate (TESSALON) 100 MG capsule    Sig: Take 1 capsule (100 mg total) by mouth 2 (two) times daily as needed for cough.    Dispense:  20 capsule    Refill:  0    Order Specific Question:  Supervising Provider    Answer:  Deborra Medina L [[id number]]  . guaiFENesin-codeine (ROBITUSSIN AC) 100-10 MG/5ML syrup    Sig: Take 5 mLs by mouth at bedtime.    Dispense:  118 mL    Refill:  0    Order Specific Question:  Supervising Provider    Answer:  Crecencio Mc [[id number]]     Follow-up: Return if symptoms worsen or fail to improve.

## 2015-05-28 NOTE — Patient Instructions (Signed)
Upper Respiratory Infection, Adult Most upper respiratory infections (URIs) are a viral infection of the air passages leading to the lungs. A URI affects the nose, throat, and upper air passages. The most common type of URI is nasopharyngitis and is typically referred to as "the common cold." URIs run their course and usually go away on their own. Most of the time, a URI does not require medical attention, but sometimes a bacterial infection in the upper airways can follow a viral infection. This is called a secondary infection. Sinus and middle ear infections are common types of secondary upper respiratory infections. Bacterial pneumonia can also complicate a URI. A URI can worsen asthma and chronic obstructive pulmonary disease (COPD). Sometimes, these complications can require emergency medical care and may be life threatening.  CAUSES Almost all URIs are caused by viruses. A virus is a type of germ and can spread from one person to another.  RISKS FACTORS You may be at risk for a URI if:   You smoke.   You have chronic heart or lung disease.  You have a weakened defense (immune) system.   You are very young or very old.   You have nasal allergies or asthma.  You work in crowded or poorly ventilated areas.  You work in health care facilities or schools. SIGNS AND SYMPTOMS  Symptoms typically develop 2-3 days after you come in contact with a cold virus. Most viral URIs last 7-10 days. However, viral URIs from the influenza virus (flu virus) can last 14-18 days and are typically more severe. Symptoms may include:   Runny or stuffy (congested) nose.   Sneezing.   Cough.   Sore throat.   Headache.   Fatigue.   Fever.   Loss of appetite.   Pain in your forehead, behind your eyes, and over your cheekbones (sinus pain).  Muscle aches.  DIAGNOSIS  Your health care provider may diagnose a URI by:  Physical exam.  Tests to check that your symptoms are not due to  another condition such as:  Strep throat.  Sinusitis.  Pneumonia.  Asthma. TREATMENT  A URI goes away on its own with time. It cannot be cured with medicines, but medicines may be prescribed or recommended to relieve symptoms. Medicines may help:  Reduce your fever.  Reduce your cough.  Relieve nasal congestion. HOME CARE INSTRUCTIONS   Take medicines only as directed by your health care provider.   Gargle warm saltwater or take cough drops to comfort your throat as directed by your health care provider.  Use a warm mist humidifier or inhale steam from a shower to increase air moisture. This may make it easier to breathe.  Drink enough fluid to keep your urine clear or pale yellow.   Eat soups and other clear broths and maintain good nutrition.   Rest as needed.   Return to work when your temperature has returned to normal or as your health care provider advises. You may need to stay home longer to avoid infecting others. You can also use a face mask and careful hand washing to prevent spread of the virus.  Increase the usage of your inhaler if you have asthma.   Do not use any tobacco products, including cigarettes, chewing tobacco, or electronic cigarettes. If you need help quitting, ask your health care provider. PREVENTION  The best way to protect yourself from getting a cold is to practice good hygiene.   Avoid oral or hand contact with people with cold   symptoms.   Wash your hands often if contact occurs.  There is no clear evidence that vitamin C, vitamin E, echinacea, or exercise reduces the chance of developing a cold. However, it is always recommended to get plenty of rest, exercise, and practice good nutrition.  SEEK MEDICAL CARE IF:   You are getting worse rather than better.   Your symptoms are not controlled by medicine.   You have chills.  You have worsening shortness of breath.  You have brown or red mucus.  You have yellow or brown nasal  discharge.  You have pain in your face, especially when you bend forward.  You have a fever.  You have swollen neck glands.  You have pain while swallowing.  You have white areas in the back of your throat. SEEK IMMEDIATE MEDICAL CARE IF:   You have severe or persistent:  Headache.  Ear pain.  Sinus pain.  Chest pain.  You have chronic lung disease and any of the following:  Wheezing.  Prolonged cough.  Coughing up blood.  A change in your usual mucus.  You have a stiff neck.  You have changes in your:  Vision.  Hearing.  Thinking.  Mood. MAKE SURE YOU:   Understand these instructions.  Will watch your condition.  Will get help right away if you are not doing well or get worse.   This information is not intended to replace advice given to you by your health care provider. Make sure you discuss any questions you have with your health care provider.   Document Released: 12/29/2000 Document Revised: 11/19/2014 Document Reviewed: 10/10/2013 Elsevier Interactive Patient Education 2016 [employer]  

## 2015-06-02 NOTE — Assessment & Plan Note (Signed)
Likely viral in etiology. O2 sats normal for pt. Due to smoking and CAP hx will treat (7 days with no improvement in symptoms also). Z-pack sent to pharmacy Robitussin [facility] printed and given to pt to take with explicit instructions on use and precautions given.  Tessalon perles also requested and sent to pharmacy for daytime use. FU prn worsening/failure to improve.

## 2015-06-09 LAB — HM MAMMOGRAPHY: HM MAMMO: NEGATIVE

## 2015-06-10 ENCOUNTER — Telehealth: Payer: Self-pay | Admitting: Nurse Practitioner

## 2015-06-10 NOTE — Telephone Encounter (Signed)
See below

## 2015-06-10 NOTE — Telephone Encounter (Signed)
Please call this patient and scheduled follow up appt since she is not getting in better

## 2015-06-10 NOTE — Telephone Encounter (Signed)
If persistent symptoms, needs to be reevaluated.

## 2015-06-10 NOTE — Telephone Encounter (Signed)
Pt called about having a cough she came in and was given the Z pac. Pt states the Z pac did not work and is still coughing. Pt was told to call back in if it did not go away. Pt also states that the Z pac never works for her. Pharmacy is [facility], [place]. Thank You!

## 2015-06-16 ENCOUNTER — Encounter: Payer: Self-pay | Admitting: Internal Medicine

## 2015-06-16 ENCOUNTER — Ambulatory Visit: Payer: Commercial Managed Care - HMO | Admitting: Nurse Practitioner

## 2015-06-16 ENCOUNTER — Ambulatory Visit (INDEPENDENT_AMBULATORY_CARE_PROVIDER_SITE_OTHER): Payer: Commercial Managed Care - HMO | Admitting: Internal Medicine

## 2015-06-16 VITALS — BP 120/86 | HR 78 | Temp 97.6°F | Resp 18 | Ht 63.5 in | Wt 148.0 lb

## 2015-06-16 DIAGNOSIS — Z720 Tobacco use: Secondary | ICD-10-CM

## 2015-06-16 DIAGNOSIS — J069 Acute upper respiratory infection, unspecified: Secondary | ICD-10-CM | POA: Insufficient documentation

## 2015-06-16 MED ORDER — LEVOFLOXACIN 500 MG PO TABS: 500.0000 mg | ORAL_TABLET | Freq: Every day | ORAL | Status: DC

## 2015-06-16 MED ORDER — PREDNISONE 10 MG PO TABS: ORAL_TABLET | ORAL | Status: DC

## 2015-06-16 NOTE — Progress Notes (Signed)
Pre-visit discussion using our clinic review tool. No additional management support is needed unless otherwise documented below in the visit note.  

## 2015-06-16 NOTE — Assessment & Plan Note (Signed)
Discussed with her regarding the need to quit.  She has cut down.  Follow.

## 2015-06-16 NOTE — Progress Notes (Signed)
[name], female   DOB: 1946/01/23, 69 y.o.   MRN: [id number]   [name], female    DOB: 10/29/1945, 69 y.o.   MRN: [id number]  HPI  Patient with past history of hypercholesterolemia who comes in today as a work in with concerns regarding persistent cough and congestion.  She was seen 05/26/15 by Lorane Gell for cough and congestion of one week duration.  Was given a zpak.  Is better, but still with symptoms.  Increased cough and some coughing fits.  Productive of colored mucus this am.  No increased wheezing.  No fever.  Minimal sinus pressure.  No chest pain or tightness.  States feels was partially treated.  Eating and drinking.  No vomiting.  No diarrhea.     Past Medical History  Diagnosis Date  . Migraines   . Hypercholesterolemia   . Chronic bronchitis [facility])    Past Surgical History  Procedure Laterality Date  . Breast biopsy  1979    benign  . Abdominal hysterectomy  1979    secondary to fibroids, incidental appendectomy  . Tonsillectomy  1960   Family History  Problem Relation Age of Onset  . Heart disease Father     myocardial infarction - died age 9  . Heart disease Mother   . Diabetes Mother   . Diabetes Maternal Grandmother   . Breast cancer Neg Hx   . Colon cancer Neg Hx    Social History   Social History  . Marital Status: Married    Spouse Name: N/A  . Number of Children: 1  . Years of Education: N/A   Social History Main Topics  . Smoking status: Current Every Day Smoker -- 1.00 packs/day  . Smokeless tobacco: Never Used     Comment: is going to try electronic cigarettes  . Alcohol Use: No  . Drug Use: No  . Sexual Activity: Not Asked   Other Topics Concern  . None   Social History Narrative    Outpatient Encounter Prescriptions as of 06/16/2015  Medication Sig  . atorvastatin (LIPITOR) 20 MG tablet Take 1 tablet (20 mg total) by mouth daily.  . benzonatate (TESSALON) 100 MG capsule Take  1 capsule (100 mg total) by mouth 2 (two) times daily as needed for cough.  Marland Kitchen guaiFENesin-codeine (ROBITUSSIN AC) 100-10 MG/5ML syrup Take 5 mLs by mouth at bedtime.  . [DISCONTINUED] azithromycin (ZITHROMAX) 250 MG tablet Take 2 tablets by mouth on day 1, take 1 tablet by mouth each day after for 4 days.  Marland Kitchen levofloxacin (LEVAQUIN) 500 MG tablet Take 1 tablet (500 mg total) by mouth daily.  . predniSONE (DELTASONE) 10 MG tablet Take 4 tablets x 1 day and then decrease by 1/2 tablet per day until down to zero mg.   No facility-administered encounter medications on file as of 06/16/2015.    Review of Systems  Constitutional: Negative for fever, appetite change and unexpected weight change.  HENT: Positive for congestion (nasal congestion. ) and postnasal drip. Negative for sinus pressure.   Respiratory: Positive for cough. Negative for chest tightness and shortness of breath.   Cardiovascular: Negative for chest pain, palpitations and leg swelling.  Gastrointestinal: Negative for nausea, vomiting and diarrhea.  Skin: Negative for color change and rash.  Neurological: Negative for dizziness, light-headedness and headaches.  Psychiatric/Behavioral: Negative for dysphoric mood and agitation.       Objective:    Physical Exam  Constitutional:  She appears well-developed. No distress.  HENT:  Nose: Nose normal.  Mouth/Throat: Oropharynx is clear and moist.  Neck: Neck supple. No thyromegaly present.  Cardiovascular: Normal rate and regular rhythm.   Pulmonary/Chest: Breath sounds normal. No respiratory distress. She has no wheezes.  Abdominal: Soft. Bowel sounds are normal. There is no tenderness.  Musculoskeletal: She exhibits no edema or tenderness.  Lymphadenopathy:    She has no cervical adenopathy.  Skin: No rash noted. No erythema.  Psychiatric: She has a normal mood and affect. Her behavior is normal.    BP 120/86 mmHg  Pulse 78  Temp(Src) 97.6 F (36.4 C) (Oral)  Resp 18   Ht 5' 3.5" (1.613 m)  Wt 148 lb (67.132 kg)  BMI 25.80 kg/m2  SpO2 98% Wt Readings from Last 3 Encounters:  06/16/15 148 lb (67.132 kg)  05/28/15 149 lb (67.586 kg)  04/01/15 149 lb 8 oz (67.813 kg)     Lab Results  Component Value Date   WBC 8.8 01/21/2015   HGB 13.8 01/21/2015   HCT 41.4 01/21/2015   PLT 386.0 01/21/2015   GLUCOSE 77 01/06/2015   CHOL 186 01/06/2015   TRIG 228.0* 01/06/2015   HDL 64.10 01/06/2015   LDLDIRECT 75.0 01/06/2015   LDLCALC 91 07/31/2014   ALT 15 01/06/2015   AST 13 01/06/2015   NA 139 01/06/2015   K 4.7 01/06/2015   CL 103 01/06/2015   CREATININE 0.75 01/06/2015   BUN 18 01/06/2015   CO2 31 01/06/2015   TSH 2.82 01/06/2015       Assessment & Plan:   Problem List Items Addressed This Visit    Tobacco abuse - Primary    Discussed with her regarding the need to quit.  She has cut down.  Follow.       URI (upper respiratory infection)    Symptoms and exam as outlined.  Treat with saline nasal spray and nasacort nasal spray as directed.  mucinex and robitussin as directed.  Took zpak previously.  Is some better.  Concern partially treated.  Add levaquin x one week.  Prednisone taper.  Notify me if persistent symptoms.            Einar Pheasant, MD

## 2015-06-16 NOTE — Patient Instructions (Signed)
Take align - one per day while on the antibiotics and for two weeks after completing antibiotics.

## 2015-06-16 NOTE — Assessment & Plan Note (Signed)
Symptoms and exam as outlined.  Treat with saline nasal spray and nasacort nasal spray as directed.  mucinex and robitussin as directed.  Took zpak previously.  Is some better.  Concern partially treated.  Add levaquin x one week.  Prednisone taper.  Notify me if persistent symptoms.

## 2015-06-16 NOTE — Telephone Encounter (Signed)
Error

## 2015-06-24 ENCOUNTER — Encounter: Payer: Self-pay | Admitting: Internal Medicine

## 2015-07-08 ENCOUNTER — Encounter: Payer: Self-pay | Admitting: Internal Medicine

## 2015-07-08 DIAGNOSIS — Z0000 Encounter for general adult medical examination without abnormal findings: Secondary | ICD-10-CM | POA: Insufficient documentation

## 2015-07-30 ENCOUNTER — Other Ambulatory Visit (INDEPENDENT_AMBULATORY_CARE_PROVIDER_SITE_OTHER): Payer: PPO

## 2015-07-30 DIAGNOSIS — IMO0001 Reserved for inherently not codable concepts without codable children: Secondary | ICD-10-CM

## 2015-07-30 DIAGNOSIS — E7800 Pure hypercholesterolemia, unspecified: Secondary | ICD-10-CM

## 2015-07-30 DIAGNOSIS — R030 Elevated blood-pressure reading, without diagnosis of hypertension: Secondary | ICD-10-CM

## 2015-07-30 LAB — HEPATIC FUNCTION PANEL
ALBUMIN: 4.4 g/dL (ref 3.5–5.2)
ALT: 23 U/L (ref 0–35)
AST: 20 U/L (ref 0–37)
Alkaline Phosphatase: 98 U/L (ref 39–117)
BILIRUBIN TOTAL: 0.5 mg/dL (ref 0.2–1.2)
Bilirubin, Direct: 0.1 mg/dL (ref 0.0–0.3)
Total Protein: 6.6 g/dL (ref 6.0–8.3)

## 2015-07-30 LAB — LIPID PANEL
CHOLESTEROL: 198 mg/dL (ref 0–200)
HDL: 63 mg/dL (ref >39.00)
NonHDL: 135.4
Total CHOL/HDL Ratio: 3
Triglycerides: 222 mg/dL — ABNORMAL HIGH (ref 0.0–149.0)
VLDL: 44.4 mg/dL — AB (ref 0.0–40.0)

## 2015-07-30 LAB — BASIC METABOLIC PANEL
BUN: 15 mg/dL (ref 6–23)
CALCIUM: 9.7 mg/dL (ref 8.4–10.5)
CO2: 31 mEq/L (ref 19–32)
Chloride: 106 mEq/L (ref 96–112)
Creatinine, Ser: 0.68 mg/dL (ref 0.40–1.20)
GFR: 90.54 mL/min (ref >60.00)
Glucose, Bld: 95 mg/dL (ref 70–99)
Potassium: 5.1 mEq/L (ref 3.5–5.1)
SODIUM: 143 meq/L (ref 135–145)

## 2015-07-30 LAB — LDL CHOLESTEROL, DIRECT: LDL DIRECT: 90 mg/dL

## 2015-08-01 ENCOUNTER — Ambulatory Visit (INDEPENDENT_AMBULATORY_CARE_PROVIDER_SITE_OTHER): Payer: PPO | Admitting: Internal Medicine

## 2015-08-01 ENCOUNTER — Encounter: Payer: Self-pay | Admitting: Internal Medicine

## 2015-08-01 VITALS — BP 120/80 | HR 73 | Temp 97.8°F | Resp 18 | Ht 62.0 in | Wt 148.5 lb

## 2015-08-01 DIAGNOSIS — D473 Essential (hemorrhagic) thrombocythemia: Secondary | ICD-10-CM | POA: Diagnosis not present

## 2015-08-01 DIAGNOSIS — E7800 Pure hypercholesterolemia, unspecified: Secondary | ICD-10-CM

## 2015-08-01 DIAGNOSIS — J069 Acute upper respiratory infection, unspecified: Secondary | ICD-10-CM | POA: Diagnosis not present

## 2015-08-01 DIAGNOSIS — R030 Elevated blood-pressure reading, without diagnosis of hypertension: Secondary | ICD-10-CM

## 2015-08-01 DIAGNOSIS — Z0000 Encounter for general adult medical examination without abnormal findings: Secondary | ICD-10-CM

## 2015-08-01 DIAGNOSIS — IMO0001 Reserved for inherently not codable concepts without codable children: Secondary | ICD-10-CM

## 2015-08-01 DIAGNOSIS — Z720 Tobacco use: Secondary | ICD-10-CM | POA: Diagnosis not present

## 2015-08-01 DIAGNOSIS — D75839 Thrombocytosis, unspecified: Secondary | ICD-10-CM

## 2015-08-01 MED ORDER — PREDNISONE 10 MG PO TABS: ORAL_TABLET | ORAL | Status: DC

## 2015-08-01 MED ORDER — CEFUROXIME AXETIL 250 MG PO TABS: 250.0000 mg | ORAL_TABLET | Freq: Two times a day (BID) | ORAL | Status: DC

## 2015-08-01 NOTE — Progress Notes (Signed)
[name], female   DOB: November 12, 1945, 70 y.o.   MRN: [id number]   [name], female    DOB: [birth year], 70 y.o.   MRN: [id number]  HPI  Patient with past history of hypercholesterolemia, tobacco abuse and chronic bronchitis.  She comes in today to follow up on these issues as well as for her physical exam.  She reports that starting over the past week, she noticed increased nasal congestion and post nasal drainage.  Symptoms have progressed.  Increased cough and congestion.  Cough productive.  Increased nasal mucus production.  Eating and drinking.  No nausea or vomiting.  No sob.  No acid reflux reported.  No abdominal pain or cramping.  Bowels stable.  Tries to stay active.     Past Medical History  Diagnosis Date  . Migraines   . Hypercholesterolemia   . Chronic bronchitis [facility])    Past Surgical History  Procedure Laterality Date  . Breast biopsy  1979    benign  . Abdominal hysterectomy  1979    secondary to fibroids, incidental appendectomy  . Tonsillectomy  1960   Family History  Problem Relation Age of Onset  . Heart disease Father     myocardial infarction - died age 89  . Heart disease Mother   . Diabetes Mother   . Diabetes Maternal Grandmother   . Breast cancer Neg Hx   . Colon cancer Neg Hx    Social History   Social History  . Marital Status: Married    Spouse Name: N/A  . Number of Children: 1  . Years of Education: N/A   Social History Main Topics  . Smoking status: Current Every Day Smoker -- 1.00 packs/day  . Smokeless tobacco: Never Used     Comment: is going to try electronic cigarettes  . Alcohol Use: No  . Drug Use: No  . Sexual Activity: Not Asked   Other Topics Concern  . None   Social History Narrative    Outpatient Encounter Prescriptions as of 08/01/2015  Medication Sig  . atorvastatin (LIPITOR) 20 MG tablet Take 1 tablet (20 mg total) by mouth daily.  . cefUROXime (CEFTIN) 250 MG  tablet Take 1 tablet (250 mg total) by mouth 2 (two) times daily with a meal.  . predniSONE (DELTASONE) 10 MG tablet Take 6 tablets x 1 day and then decreased by 1/2 tablet per day until down to zero mg.  . [DISCONTINUED] benzonatate (TESSALON) 100 MG capsule Take 1 capsule (100 mg total) by mouth 2 (two) times daily as needed for cough. (Patient not taking: Reported on 08/01/2015)  . [DISCONTINUED] guaiFENesin-codeine (ROBITUSSIN AC) 100-10 MG/5ML syrup Take 5 mLs by mouth at bedtime. (Patient not taking: Reported on 08/01/2015)  . [DISCONTINUED] levofloxacin (LEVAQUIN) 500 MG tablet Take 1 tablet (500 mg total) by mouth daily. (Patient not taking: Reported on 08/01/2015)  . [DISCONTINUED] predniSONE (DELTASONE) 10 MG tablet Take 4 tablets x 1 day and then decrease by 1/2 tablet per day until down to zero mg. (Patient not taking: Reported on 08/01/2015)   No facility-administered encounter medications on file as of 08/01/2015.    Review of Systems  Constitutional: Negative for fever and unexpected weight change.  HENT: Positive for congestion, postnasal drip and sinus pressure. Negative for sore throat.   Eyes: Negative for discharge and redness.  Respiratory: Positive for cough. Negative for chest tightness and shortness of breath.   Cardiovascular: Negative for  chest pain and palpitations.  Gastrointestinal: Negative for nausea, vomiting, abdominal pain and diarrhea.  Genitourinary: Negative for dysuria and difficulty urinating.  Musculoskeletal: Negative for back pain and joint swelling.  Skin: Negative for color change and rash.  Neurological: Negative for dizziness, light-headedness and headaches.  Hematological: Negative for adenopathy. Does not bruise/bleed easily.  Psychiatric/Behavioral: Negative for dysphoric mood and agitation.       Objective:    Physical Exam  Constitutional: She is oriented to person, place, and time. She appears well-developed and well-nourished. No distress.    HENT:  Mouth/Throat: Oropharynx is clear and moist.  TMs visualized - without erythema.  Nares - slightly erythematous turbinates.    Eyes: Conjunctivae are normal. Right eye exhibits no discharge. Left eye exhibits no discharge.  Neck: Neck supple. No thyromegaly present.  Cardiovascular: Normal rate and regular rhythm.   Pulmonary/Chest: Breath sounds normal. No accessory muscle usage. No tachypnea. No respiratory distress. She has no decreased breath sounds. She has no rhonchi. Right breast exhibits no inverted nipple, no mass, no nipple discharge and no tenderness (no axillary adenopathy). Left breast exhibits no inverted nipple, no mass, no nipple discharge and no tenderness (no axilarry adenopathy).  Increased cough with forced expiration.   Abdominal: Soft. Bowel sounds are normal. There is no tenderness.  Musculoskeletal: She exhibits no edema or tenderness.  Lymphadenopathy:    She has no cervical adenopathy.  Neurological: She is alert and oriented to person, place, and time.  Skin: Skin is warm. No rash noted. No erythema.  Psychiatric: She has a normal mood and affect. Her behavior is normal.    BP 120/80 mmHg  Pulse 73  Temp(Src) 97.8 F (36.6 C) (Oral)  Resp 18  Ht 5\' 2"  (1.575 m)  Wt 148 lb 8 oz (67.359 kg)  BMI 27.15 kg/m2  SpO2 97% Wt Readings from Last 3 Encounters:  08/01/15 148 lb 8 oz (67.359 kg)  06/16/15 148 lb (67.132 kg)  05/28/15 149 lb (67.586 kg)     Lab Results  Component Value Date   WBC 8.8 01/21/2015   HGB 13.8 01/21/2015   HCT 41.4 01/21/2015   PLT 386.0 01/21/2015   GLUCOSE 95 07/30/2015   CHOL 198 07/30/2015   TRIG 222.0* 07/30/2015   HDL 63.00 07/30/2015   LDLDIRECT 90.0 07/30/2015   LDLCALC 91 07/31/2014   ALT 23 07/30/2015   AST 20 07/30/2015   NA 143 07/30/2015   K 5.1 07/30/2015   CL 106 07/30/2015   CREATININE 0.68 07/30/2015   BUN 15 07/30/2015   CO2 31 07/30/2015   TSH 2.82 01/06/2015       Assessment & Plan:    Problem List Items Addressed This Visit    Elevated blood pressure    Blood pressure as outlined on no medication.  Follow.       Health care maintenance    Physical today.  Mammogram 06/19/15 - Birads I.  States had colonoscopy two years ago.  Need results.        Hypercholesterolemia    On atorvastatin.  Low cholesterol diet and exercise.  Follow lipid panel and liver function tests.        Relevant Orders   TSH   Lipid panel   Hepatic function panel   Basic metabolic panel   Thrombocytosis (HCC)    Last platelet count checked wnl.  Follow.       Relevant Orders   CBC with Differential/Platelet   Tobacco abuse  She has cut down.  Discussed the need to quit.  Follow.        URI (upper respiratory infection) - Primary    Symptoms and exam as outlined.  Treat with saline nasal spray and nasacort nasal spray as outlined.  Mucinex as directed.  Will give her a prednisone taper to take as directed.  Was given a prescription for ceftin with instructions only to take if symptoms worsen or do not improve.  Rest. Fluids.  Notify me if persistent problems.        Relevant Medications   cefUROXime (CEFTIN) 250 MG tablet    Other Visit Diagnoses    Routine general medical examination at a health care facility            Einar Pheasant, MD

## 2015-08-01 NOTE — Progress Notes (Signed)
Pre-visit discussion using our clinic review tool. No additional management support is needed unless otherwise documented below in the visit note.  

## 2015-08-03 ENCOUNTER — Encounter: Payer: Self-pay | Admitting: Internal Medicine

## 2015-08-03 NOTE — Assessment & Plan Note (Signed)
Last platelet count checked wnl.  Follow.

## 2015-08-03 NOTE — Assessment & Plan Note (Signed)
Physical today.  Mammogram 06/19/15 - Birads I.  States had colonoscopy two years ago.  Need results.

## 2015-08-03 NOTE — Assessment & Plan Note (Signed)
Blood pressure as outlined on no medication.  Follow.

## 2015-08-03 NOTE — Assessment & Plan Note (Signed)
She has cut down.  Discussed the need to quit.  Follow.

## 2015-08-03 NOTE — Assessment & Plan Note (Signed)
Symptoms and exam as outlined.  Treat with saline nasal spray and nasacort nasal spray as outlined.  Mucinex as directed.  Will give her a prednisone taper to take as directed.  Was given a prescription for ceftin with instructions only to take if symptoms worsen or do not improve.  Rest. Fluids.  Notify me if persistent problems.

## 2015-08-03 NOTE — Assessment & Plan Note (Signed)
On atorvastatin.  Low cholesterol diet and exercise.  Follow lipid panel and liver function tests.  

## 2015-08-18 ENCOUNTER — Other Ambulatory Visit: Payer: Self-pay | Admitting: Internal Medicine

## 2015-08-27 ENCOUNTER — Ambulatory Visit (INDEPENDENT_AMBULATORY_CARE_PROVIDER_SITE_OTHER): Payer: PPO

## 2015-08-27 VITALS — BP 128/80 | HR 78 | Temp 97.4°F | Resp 14 | Ht 62.0 in | Wt 150.4 lb

## 2015-08-27 DIAGNOSIS — Z0000 Encounter for general adult medical examination without abnormal findings: Secondary | ICD-10-CM

## 2015-08-27 NOTE — Patient Instructions (Addendum)
Monica Morales,  Thank you for taking time to come for your Medicare Wellness Visit.  I appreciate your ongoing commitment to your health goals. Please review the following plan we discussed and let me know if I can assist you in the future.  Follow up with Dr. Nicki Reaper as needed.  Health Maintenance, Female Adopting a healthy lifestyle and getting preventive care can go a long way to promote health and wellness. Talk with your health care provider about what schedule of regular examinations is right for you. This is a good chance for you to check in with your provider about disease prevention and staying healthy. In between checkups, there are plenty of things you can do on your own. Experts have done a lot of research about which lifestyle changes and preventive measures are most likely to keep you healthy. Ask your health care provider for more information. WEIGHT AND DIET  Eat a healthy diet  Be sure to include plenty of vegetables, fruits, low-fat dairy products, and lean protein.  Do not eat a lot of foods high in solid fats, added sugars, or salt.  Get regular exercise. This is one of the most important things you can do for your health.  Most adults should exercise for at least 150 minutes each week. The exercise should increase your heart rate and make you sweat (moderate-intensity exercise).  Most adults should also do strengthening exercises at least twice a week. This is in addition to the moderate-intensity exercise.  Maintain a healthy weight  Body mass index (BMI) is a measurement that can be used to identify possible weight problems. It estimates body fat based on height and weight. Your health care provider can help determine your BMI and help you achieve or maintain a healthy weight.  For females 66 years of age and older:   A BMI below 18.5 is considered underweight.  A BMI of 18.5 to 24.9 is normal.  A BMI of 25 to 29.9 is considered overweight.  A BMI of 30 and  above is considered obese.  Watch levels of cholesterol and blood lipids  You should start having your blood tested for lipids and cholesterol at 72 years of age, then have this test every 5 years.  You may need to have your cholesterol levels checked more often if:  Your lipid or cholesterol levels are high.  You are older than 72 years of age.  You are at high risk for heart disease.  CANCER SCREENING   Lung Cancer  Lung cancer screening is recommended for adults 58-63 years old who are at high risk for lung cancer because of a history of smoking.  A yearly low-dose CT scan of the lungs is recommended for people who:  Currently smoke.  Have quit within the past 15 years.  Have at least a 30-pack-year history of smoking. A pack year is smoking an average of one pack of cigarettes a day for 1 year.  Yearly screening should continue until it has been 15 years since you quit.  Yearly screening should stop if you develop a health problem that would prevent you from having lung cancer treatment.  Breast Cancer  Practice breast self-awareness. This means understanding how your breasts normally appear and feel.  It also means doing regular breast self-exams. Let your health care provider know about any changes, no matter how small.  If you are in your 20s or 30s, you should have a clinical breast exam (CBE) by a health care provider  every 1-3 years as part of a regular health exam.  If you are 64 or older, have a CBE every year. Also consider having a breast X-ray (mammogram) every year.  If you have a family history of breast cancer, talk to your health care provider about genetic screening.  If you are at high risk for breast cancer, talk to your health care provider about having an MRI and a mammogram every year.  Breast cancer gene (BRCA) assessment is recommended for women who have family members with BRCA-related cancers. BRCA-related cancers  include:  Breast.  Ovarian.  Tubal.  Peritoneal cancers.  Results of the assessment will determine the need for genetic counseling and BRCA1 and BRCA2 testing. Cervical Cancer Your health care provider may recommend that you be screened regularly for cancer of the pelvic organs (ovaries, uterus, and vagina). This screening involves a pelvic examination, including checking for microscopic changes to the surface of your cervix (Pap test). You may be encouraged to have this screening done every 3 years, beginning at age 77.  For women ages 58-65, health care providers may recommend pelvic exams and Pap testing every 3 years, or they may recommend the Pap and pelvic exam, combined with testing for human papilloma virus (HPV), every 5 years. Some types of HPV increase your risk of cervical cancer. Testing for HPV may also be done on women of any age with unclear Pap test results.  Other health care providers may not recommend any screening for nonpregnant women who are considered low risk for pelvic cancer and who do not have symptoms. Ask your health care provider if a screening pelvic exam is right for you.  If you have had past treatment for cervical cancer or a condition that could lead to cancer, you need Pap tests and screening for cancer for at least 20 years after your treatment. If Pap tests have been discontinued, your risk factors (such as having a new sexual partner) need to be reassessed to determine if screening should resume. Some women have medical problems that increase the chance of getting cervical cancer. In these cases, your health care provider may recommend more frequent screening and Pap tests. Colorectal Cancer  This type of cancer can be detected and often prevented.  Routine colorectal cancer screening usually begins at 72 years of age and continues through 71 years of age.  Your health care provider may recommend screening at an earlier age if you have risk factors for  colon cancer.  Your health care provider may also recommend using home test kits to check for hidden blood in the stool.  A small camera at the end of a tube can be used to examine your colon directly (sigmoidoscopy or colonoscopy). This is done to check for the earliest forms of colorectal cancer.  Routine screening usually begins at age 86.  Direct examination of the colon should be repeated every 5-10 years through 72 years of age. However, you may need to be screened more often if early forms of precancerous polyps or small growths are found. Skin Cancer  Check your skin from head to toe regularly.  Tell your health care provider about any new moles or changes in moles, especially if there is a change in a mole's shape or color.  Also tell your health care provider if you have a mole that is larger than the size of a pencil eraser.  Always use sunscreen. Apply sunscreen liberally and repeatedly throughout the day.  Protect yourself by  wearing long sleeves, pants, a wide-brimmed hat, and sunglasses whenever you are outside. HEART DISEASE, DIABETES, AND HIGH BLOOD PRESSURE   High blood pressure causes heart disease and increases the risk of stroke. High blood pressure is more likely to develop in:  People who have blood pressure in the high end of the normal range (130-139/85-89 mm Hg).  People who are overweight or obese.  People who are African American.  If you are 31-28 years of age, have your blood pressure checked every 3-5 years. If you are 51 years of age or older, have your blood pressure checked every year. You should have your blood pressure measured twice--once when you are at a hospital or clinic, and once when you are not at a hospital or clinic. Record the average of the two measurements. To check your blood pressure when you are not at a hospital or clinic, you can use:  An automated blood pressure machine at a pharmacy.  A home blood pressure monitor.  If you  are between 76 years and 23 years old, ask your health care provider if you should take aspirin to prevent strokes.  Have regular diabetes screenings. This involves taking a blood sample to check your fasting blood sugar level.  If you are at a normal weight and have a low risk for diabetes, have this test once every three years after 72 years of age.  If you are overweight and have a high risk for diabetes, consider being tested at a younger age or more often. PREVENTING INFECTION  Hepatitis B  If you have a higher risk for hepatitis B, you should be screened for this virus. You are considered at high risk for hepatitis B if:  You were born in a country where hepatitis B is common. Ask your health care provider which countries are considered high risk.  Your parents were born in a high-risk country, and you have not been immunized against hepatitis B (hepatitis B vaccine).  You have HIV or AIDS.  You use needles to inject street drugs.  You live with someone who has hepatitis B.  You have had sex with someone who has hepatitis B.  You get hemodialysis treatment.  You take certain medicines for conditions, including cancer, organ transplantation, and autoimmune conditions. Hepatitis C  Blood testing is recommended for:  Everyone born from [birth year] through 1967.  Anyone with known risk factors for hepatitis C. Sexually transmitted infections (STIs)  You should be screened for sexually transmitted infections (STIs) including gonorrhea and chlamydia if:  You are sexually active and are younger than 72 years of age.  You are older than 72 years of age and your health care provider tells you that you are at risk for this type of infection.  Your sexual activity has changed since you were last screened and you are at an increased risk for chlamydia or gonorrhea. Ask your health care provider if you are at risk.  If you do not have HIV, but are at risk, it may be recommended that you  take a prescription medicine daily to prevent HIV infection. This is called pre-exposure prophylaxis (PrEP). You are considered at risk if:  You are sexually active and do not regularly use condoms or know the HIV status of your partner(s).  You take drugs by injection.  You are sexually active with a partner who has HIV. Talk with your health care provider about whether you are at high risk of being infected with HIV. If  you choose to begin PrEP, you should first be tested for HIV. You should then be tested every 3 months for as long as you are taking PrEP.  PREGNANCY   If you are premenopausal and you may become pregnant, ask your health care provider about preconception counseling.  If you may become pregnant, take 400 to 800 micrograms (mcg) of folic acid every day.  If you want to prevent pregnancy, talk to your health care provider about birth control (contraception). OSTEOPOROSIS AND MENOPAUSE   Osteoporosis is a disease in which the bones lose minerals and strength with aging. This can result in serious bone fractures. Your risk for osteoporosis can be identified using a bone density scan.  If you are 15 years of age or older, or if you are at risk for osteoporosis and fractures, ask your health care provider if you should be screened.  Ask your health care provider whether you should take a calcium or vitamin D supplement to lower your risk for osteoporosis.  Menopause may have certain physical symptoms and risks.  Hormone replacement therapy may reduce some of these symptoms and risks. Talk to your health care provider about whether hormone replacement therapy is right for you.  HOME CARE INSTRUCTIONS   Schedule regular health, dental, and eye exams.  Stay current with your immunizations.   Do not use any tobacco products including cigarettes, chewing tobacco, or electronic cigarettes.  If you are pregnant, do not drink alcohol.  If you are breastfeeding, limit how  much and how often you drink alcohol.  Limit alcohol intake to no more than 1 drink per day for nonpregnant women. One drink equals 12 ounces of beer, 5 ounces of wine, or 1 ounces of hard liquor.  Do not use street drugs.  Do not share needles.  Ask your health care provider for help if you need support or information about quitting drugs.  Tell your health care provider if you often feel depressed.  Tell your health care provider if you have ever been abused or do not feel safe at home.   This information is not intended to replace advice given to you by your health care provider. Make sure you discuss any questions you have with your health care provider.   Document Released: 01/18/2011 Document Revised: 07/26/2014 Document Reviewed: 06/06/2013 Elsevier Interactive Patient Education 2016 [place] Can Quit Smoking If you are ready to quit smoking or are thinking about it, congratulations! You have chosen to help yourself be healthier and live longer! There are lots of different ways to quit smoking. Nicotine gum, nicotine patches, a nicotine inhaler, or nicotine nasal spray can help with physical craving. Hypnosis, support groups, and medicines help break the habit of smoking. TIPS TO GET OFF AND STAY OFF CIGARETTES  Learn to predict your moods. Do not let a bad situation be your excuse to have a cigarette. Some situations in your life might tempt you to have a cigarette.  Ask friends and co-workers not to smoke around you.  Make your home smoke-free.  Never have "just one" cigarette. It leads to wanting another and another. Remind yourself of your decision to quit.  On a card, make a list of your reasons for not smoking. Read it at least the same number of times a day as you have a cigarette. Tell yourself everyday, "I do not want to smoke. I choose not to smoke."  Ask someone at home or work to help you with your  plan to quit smoking.  Have something planned after  you eat or have a cup of coffee. Take a walk or get other exercise to perk you up. This will help to keep you from overeating.  Try a relaxation exercise to calm you down and decrease your stress. Remember, you may be tense and nervous the first two weeks after you quit. This will pass.  Find new activities to keep your hands busy. Play with a pen, coin, or rubber band. Doodle or draw things on paper.  Brush your teeth right after eating. This will help cut down the craving for the taste of tobacco after meals. You can try mouthwash too.  Try gum, breath mints, or diet candy to keep something in your mouth. IF YOU SMOKE AND WANT TO QUIT:  Do not stock up on cigarettes. Never buy a carton. Wait until one pack is finished before you buy another.  Never carry cigarettes with you at work or at home.  Keep cigarettes as far away from you as possible. Leave them with someone else.  Never carry matches or a lighter with you.  Ask yourself, "Do I need this cigarette or is this just a reflex?"  Bet with someone that you can quit. Put cigarette money in a piggy bank every morning. If you smoke, you give up the money. If you do not smoke, by the end of the week, you keep the money.  Keep trying. It takes 21 days to change a habit!  Talk to your doctor about using medicines to help you quit. These include nicotine replacement gum, lozenges, or skin patches.   This information is not intended to replace advice given to you by your health care provider. Make sure you discuss any questions you have with your health care provider.   Document Released: 05/01/2009 Document Revised: 09/27/2011 Document Reviewed: 05/01/2009 Elsevier Interactive Patient Education [employer].

## 2015-08-27 NOTE — Progress Notes (Signed)
Subjective:   Monica Morales is a 70 y.o. female who presents for an Initial Medicare Annual Wellness Visit.  Review of Systems    No ROS.  Medicare Wellness Visit.  Cardiac Risk Factors include: advanced age (>52men, >91 women);hypertension;smoking/ tobacco exposure     Objective:    Today's Vitals   08/27/15 0943 08/27/15 1019  BP:  128/80  Pulse:  78  Temp:  97.4 F (36.3 C)  TempSrc:  Oral  Resp:  14  Height:  5\' 2"  (1.575 m)  Weight:  150 lb 6.4 oz (68.221 kg)  SpO2:  95%  PainSc: 4      Current Medications (verified) Outpatient Encounter Prescriptions as of 08/27/2015  Medication Sig  . atorvastatin (LIPITOR) 20 MG tablet TAKE ONE (1) TABLET BY MOUTH EVERY DAY  . [DISCONTINUED] cefUROXime (CEFTIN) 250 MG tablet Take 1 tablet (250 mg total) by mouth 2 (two) times daily with a meal.  . [DISCONTINUED] predniSONE (DELTASONE) 10 MG tablet Take 6 tablets x 1 day and then decreased by 1/2 tablet per day until down to zero mg.   No facility-administered encounter medications on file as of 08/27/2015.    Allergies (verified) No known drug allergy   History: Past Medical History  Diagnosis Date  . Migraines   . Hypercholesterolemia   . Chronic bronchitis [facility])    Past Surgical History  Procedure Laterality Date  . Breast biopsy  1979    benign  . Abdominal hysterectomy  1979    secondary to fibroids, incidental appendectomy  . Tonsillectomy  1960   Family History  Problem Relation Age of Onset  . Heart disease Father     myocardial infarction - died age 24  . Heart disease Mother   . Diabetes Mother   . Diabetes Maternal Grandmother   . Breast cancer Neg Hx   . Colon cancer Neg Hx    Social History   Occupational History  . Not on file.   Social History Main Topics  . Smoking status: Current Every Day Smoker -- 1.00 packs/day  . Smokeless tobacco: Never Used     Comment: is going to try electronic cigarettes  . Alcohol Use: No  . Drug Use: No    . Sexual Activity: No    Tobacco Counseling Ready to quit: No Counseling given: Not Answered   Activities of Daily Living In your present state of health, do you have any difficulty performing the following activities: 08/27/2015  Hearing? N  Vision? N  Difficulty concentrating or making decisions? N  Walking or climbing stairs? Y  Dressing or bathing? N  Doing errands, shopping? N  Preparing Food and eating ? N  Using the Toilet? N  In the past six months, have you accidently leaked urine? N  Do you have problems with loss of bowel control? N  Managing your Medications? N  Managing your Finances? N  Housekeeping or managing your Housekeeping? N    Immunizations and Health Maintenance  There is no immunization history on file for this patient. Health Maintenance Due  Topic Date Due  . Hepatitis C Screening  Sep 12, 1945  . COLONOSCOPY  02/11/1994    Patient Care Team: Einar Pheasant, MD as PCP - General (Internal Medicine)  Indicate any recent Medical Services you may have received from other than [facility] providers in the past year (date may be approximate).     Assessment:   This is a routine wellness examination for [name]. The goal of  the wellness visit is to assist the patient how to close the gaps in care and create a preventative care plan for the patient.   Osteoporosis risk reviewed.  Medications reviewed; taking without issues or barriers.  Safety issues reviewed; smoke detectors in the home. Firearms locked in a secure area in the home.  Wears seatbelts when driving or riding with others. No violence in the home.  No identified risk were noted; The patient was oriented x 3; appropriate in dress and manner and no objective failures at ADL's or IADL's.    Pneumococcal vaccine postponed per patient request.  TDAP and ZOSTAVAX vaccine postponed for follow up with insurance, Hepatits C screening postponed,  per patient request.  Educational material  provided.  Reports colonoscopy completed , but cannot remember when.  Cologuard option offered, but postponed for now.  She will try to find last date for the colonoscopy and follow up with PCP.  Patient Concerns:  Tenderness at R knee; no trauma.  Intermittent aching.  Onset about a month ago.  C/O ringing in the ears;  started late December after getting a cold and not feeling well.  Immediate follow up appointment declined.  States she will follow up with PCP if condition worsens.      Hearing/Vision screen Hearing Screening Comments: Passes the whisper test Vision Screening Comments: Followed by [facility] Annual Visits Wears readers only  Dietary issues and exercise activities discussed: Current Exercise Habits:: The patient does not participate in regular exercise at present  Goals    . Decrease smoking activity     Current everyday smoker.  Not ready to quit, but will try to decrease the daily amount by 1.  Currently has a fear of weight gain.  Educational material provided.    . Increase physical activity     Start walking outside and around the house 2 times weekly, as tolerated. Wear foot braces and proper shoes for walking to help with plantar fascitis flare ups.      Depression Screen PHQ 2/9 Scores 08/27/2015 08/01/2015 07/24/2014 12/27/2012 06/25/2012  PHQ - 2 Score 0 0 0 0 0    Fall Risk Fall Risk  08/27/2015 08/01/2015 07/24/2014 12/27/2012 06/25/2012  Falls in the past year? No No No No No    Cognitive Function: MMSE - Mini Mental State Exam 08/27/2015  Orientation to time 5  Orientation to Place 5  Registration 3  Attention/ Calculation 5  Recall 3  Language- name 2 objects 2  Language- repeat 1  Language- follow 3 step command 3  Language- read & follow direction 1  Write a sentence 1  Copy design 1  Total score 30    Screening Tests Health Maintenance  Topic Date Due  . Hepatitis C Screening  Sep 27, 1945  . COLONOSCOPY  02/11/1994  . ZOSTAVAX   08/27/2015 (Originally 02/12/2004)  . PNA vac Low Risk Adult (1 of 2 - PCV13) 08/27/2015 (Originally 02/11/2009)  . INFLUENZA VACCINE  03/31/2016 (Originally 02/17/2015)  . TETANUS/TDAP  08/26/2016 (Originally 02/12/1963)  . MAMMOGRAM  06/08/2017  . DEXA SCAN  Completed      Plan:   End of life planning; Advance aging; Advanced directives discussed. Copy of current HCPOA/Living Will requested.    During the course of the visit, Ziasia was educated and counseled about the following appropriate screening and preventive services:   Vaccines to include Pneumoccal, Influenza, Hepatitis B, Td, Zostavax, HCV  Electrocardiogram  Cardiovascular disease screening  Colorectal cancer screening  Bone density screening  Diabetes screening  Glaucoma screening  Mammography/PAP  Nutrition counseling  Smoking cessation counseling  Patient Instructions (the written plan) were given to the patient.    Varney Biles, LPN   [id number]    Reviewed above information.  Pt declines appt.  Follow.   Dr Nicki Reaper

## 2015-11-24 ENCOUNTER — Other Ambulatory Visit: Payer: PPO

## 2015-11-24 ENCOUNTER — Ambulatory Visit: Payer: PPO

## 2015-11-24 LAB — CBC WITH DIFFERENTIAL/PLATELET
Basophils Absolute: 0 10*3/uL (ref 0.0–0.1)
Basophils Relative: 0.5 % (ref 0.0–3.0)
EOS ABS: 0.3 10*3/uL (ref 0.0–0.7)
Eosinophils Relative: 3.4 % (ref 0.0–5.0)
HCT: 41 % (ref 36.0–46.0)
HEMOGLOBIN: 13.9 g/dL (ref 12.0–15.0)
LYMPHS ABS: 2.7 10*3/uL (ref 0.7–4.0)
Lymphocytes Relative: 34.7 % (ref 12.0–46.0)
MCHC: 33.9 g/dL (ref 30.0–36.0)
MCV: 92.4 fl (ref 78.0–100.0)
MONO ABS: 0.6 10*3/uL (ref 0.1–1.0)
Monocytes Relative: 7.7 % (ref 3.0–12.0)
NEUTROS PCT: 53.7 % (ref 43.0–77.0)
Neutro Abs: 4.2 10*3/uL (ref 1.4–7.7)
Platelets: 417 10*3/uL — ABNORMAL HIGH (ref 150.0–400.0)
RBC: 4.43 Mil/uL (ref 3.87–5.11)
RDW: 13.2 % (ref 11.5–15.5)
WBC: 7.8 10*3/uL (ref 4.0–10.5)

## 2015-11-24 LAB — LIPID PANEL
CHOL/HDL RATIO: 3
Cholesterol: 196 mg/dL (ref 0–200)
HDL: 66.5 mg/dL (ref >39.00)
LDL Cholesterol: 95 mg/dL (ref 0–99)
NONHDL: 129.18
Triglycerides: 170 mg/dL — ABNORMAL HIGH (ref 0.0–149.0)
VLDL: 34 mg/dL (ref 0.0–40.0)

## 2015-11-24 LAB — BASIC METABOLIC PANEL
BUN: 13 mg/dL (ref 6–23)
CHLORIDE: 105 meq/L (ref 96–112)
CO2: 27 meq/L (ref 19–32)
Calcium: 9.6 mg/dL (ref 8.4–10.5)
Creatinine, Ser: 0.69 mg/dL (ref 0.40–1.20)
GFR: 88.94 mL/min (ref >60.00)
GLUCOSE: 99 mg/dL (ref 70–99)
Potassium: 4.1 mEq/L (ref 3.5–5.1)
SODIUM: 140 meq/L (ref 135–145)

## 2015-11-24 LAB — HEPATIC FUNCTION PANEL
ALBUMIN: 4.5 g/dL (ref 3.5–5.2)
ALT: 16 U/L (ref 0–35)
AST: 17 U/L (ref 0–37)
Alkaline Phosphatase: 80 U/L (ref 39–117)
BILIRUBIN TOTAL: 0.5 mg/dL (ref 0.2–1.2)
Bilirubin, Direct: 0.1 mg/dL (ref 0.0–0.3)
Total Protein: 6.8 g/dL (ref 6.0–8.3)

## 2015-11-24 LAB — TSH: TSH: 2.37 u[IU]/mL (ref 0.35–4.50)

## 2015-11-26 ENCOUNTER — Ambulatory Visit: Payer: PPO | Admitting: Podiatry

## 2015-12-02 ENCOUNTER — Ambulatory Visit (INDEPENDENT_AMBULATORY_CARE_PROVIDER_SITE_OTHER): Payer: PPO | Admitting: Internal Medicine

## 2015-12-02 ENCOUNTER — Encounter: Payer: Self-pay | Admitting: Internal Medicine

## 2015-12-02 VITALS — BP 120/80 | HR 81 | Temp 97.4°F | Resp 18 | Ht 62.0 in | Wt 150.1 lb

## 2015-12-02 DIAGNOSIS — M79671 Pain in right foot: Secondary | ICD-10-CM

## 2015-12-02 DIAGNOSIS — D473 Essential (hemorrhagic) thrombocythemia: Secondary | ICD-10-CM | POA: Diagnosis not present

## 2015-12-02 DIAGNOSIS — Z720 Tobacco use: Secondary | ICD-10-CM

## 2015-12-02 DIAGNOSIS — M79672 Pain in left foot: Secondary | ICD-10-CM

## 2015-12-02 DIAGNOSIS — E7800 Pure hypercholesterolemia, unspecified: Secondary | ICD-10-CM | POA: Diagnosis not present

## 2015-12-02 DIAGNOSIS — D75839 Thrombocytosis, unspecified: Secondary | ICD-10-CM

## 2015-12-02 NOTE — Progress Notes (Signed)
[name], female   DOB: Sep 24, 1945, 70 y.o.   MRN: [id number]   [name], female    DOB: 09-04-1945, 70 y.o.   MRN: [id number]  HPI  Patient here for a scheduled follow up.  She has been receiving accupuncture.  Feet are better.  Able to walk more.  No cardiac symptoms with increased activity or exertion.  No sob.  No acid reflux reported.  No abdominal pain or cramping.  Bowels stable.  Overall feels good.    Past Medical History  Diagnosis Date  . Migraines   . Hypercholesterolemia   . Chronic bronchitis [facility])    Past Surgical History  Procedure Laterality Date  . Breast biopsy  1979    benign  . Abdominal hysterectomy  1979    secondary to fibroids, incidental appendectomy  . Tonsillectomy  1960   Family History  Problem Relation Age of Onset  . Heart disease Father     myocardial infarction - died age 61  . Heart disease Mother   . Diabetes Mother   . Diabetes Maternal Grandmother   . Breast cancer Neg Hx   . Colon cancer Neg Hx    Social History   Social History  . Marital Status: Married    Spouse Name: N/A  . Number of Children: 1  . Years of Education: N/A   Social History Main Topics  . Smoking status: Current Every Day Smoker -- 1.00 packs/day  . Smokeless tobacco: Never Used     Comment: is going to try electronic cigarettes  . Alcohol Use: No  . Drug Use: No  . Sexual Activity: No   Other Topics Concern  . None   Social History Narrative    Outpatient Encounter Prescriptions as of 12/02/2015  Medication Sig  . atorvastatin (LIPITOR) 20 MG tablet TAKE ONE (1) TABLET BY MOUTH EVERY DAY   No facility-administered encounter medications on file as of 12/02/2015.    Review of Systems  Constitutional: Negative for appetite change and unexpected weight change.  HENT: Negative for congestion and sinus pressure.   Respiratory: Negative for cough, chest tightness and shortness of breath.     Cardiovascular: Negative for chest pain, palpitations and leg swelling.  Gastrointestinal: Negative for nausea, vomiting, abdominal pain and diarrhea.  Genitourinary: Negative for dysuria and difficulty urinating.  Musculoskeletal: Negative for back pain and joint swelling.  Skin: Negative for color change and rash.  Neurological: Negative for dizziness, light-headedness and headaches.  Psychiatric/Behavioral: Negative for dysphoric mood and agitation.      Objective:     Blood pressure rechecked by me:  132/82  Physical Exam  Constitutional: She appears well-developed and well-nourished. No distress.  HENT:  Nose: Nose normal.  Mouth/Throat: Oropharynx is clear and moist.  Neck: Neck supple. No thyromegaly present.  Cardiovascular: Normal rate and regular rhythm.   Pulmonary/Chest: Breath sounds normal. No respiratory distress. She has no wheezes.  Abdominal: Soft. Bowel sounds are normal. There is no tenderness.  Musculoskeletal: She exhibits no edema or tenderness.  Lymphadenopathy:    She has no cervical adenopathy.  Skin: No rash noted. No erythema.    BP 120/80 mmHg  Pulse 81  Temp(Src) 97.4 F (36.3 C) (Oral)  Resp 18  Ht 5\' 2"  (1.575 m)  Wt 150 lb 2 oz (68.096 kg)  BMI 27.45 kg/m2  SpO2 95% Wt Readings from Last 3 Encounters:  12/02/15 150 lb 2 oz (68.096  kg)  08/27/15 150 lb 6.4 oz (68.221 kg)  08/01/15 148 lb 8 oz (67.359 kg)     Lab Results  Component Value Date   WBC 7.8 11/24/2015   HGB 13.9 11/24/2015   HCT 41.0 11/24/2015   PLT 417.0* 11/24/2015   GLUCOSE 99 11/24/2015   CHOL 196 11/24/2015   TRIG 170.0* 11/24/2015   HDL 66.50 11/24/2015   LDLDIRECT 90.0 07/30/2015   LDLCALC 95 11/24/2015   ALT 16 11/24/2015   AST 17 11/24/2015   NA 140 11/24/2015   K 4.1 11/24/2015   CL 105 11/24/2015   CREATININE 0.69 11/24/2015   BUN 13 11/24/2015   CO2 27 11/24/2015   TSH 2.37 11/24/2015        Assessment & Plan:   Problem List Items  Addressed This Visit    Foot pain, bilateral    accupuncture helping.  Follow.       Hypercholesterolemia    On lipitor.  Low cholesterol diet and exercise.  Follow lipid panel and liver function tests.        Thrombocytosis ([place]) - Primary    Slightly increased platelet count on last check.  Recheck platelet count in a few weeks to confirm stable.        Relevant Orders   Platelet count   Tobacco abuse    Discussed the need to quit.  Discussed screening CT.  She will notify me if agreeable.            Einar Pheasant, MD

## 2015-12-02 NOTE — Progress Notes (Signed)
Pre-visit discussion using our clinic review tool. No additional management support is needed unless otherwise documented below in the visit note.  

## 2015-12-14 ENCOUNTER — Encounter: Payer: Self-pay | Admitting: Internal Medicine

## 2015-12-14 NOTE — Assessment & Plan Note (Signed)
accupuncture helping.  Follow.

## 2015-12-14 NOTE — Assessment & Plan Note (Signed)
Discussed the need to quit.  Discussed screening CT.  She will notify me if agreeable.

## 2015-12-14 NOTE — Assessment & Plan Note (Signed)
On lipitor.  Low cholesterol diet and exercise.  Follow lipid panel and liver function tests.   

## 2015-12-14 NOTE — Assessment & Plan Note (Signed)
Slightly increased platelet count on last check.  Recheck platelet count in a few weeks to confirm stable.

## 2015-12-23 ENCOUNTER — Other Ambulatory Visit (INDEPENDENT_AMBULATORY_CARE_PROVIDER_SITE_OTHER): Payer: PPO

## 2015-12-23 DIAGNOSIS — D473 Essential (hemorrhagic) thrombocythemia: Secondary | ICD-10-CM

## 2015-12-23 DIAGNOSIS — D75839 Thrombocytosis, unspecified: Secondary | ICD-10-CM

## 2015-12-23 LAB — PLATELET COUNT: PLATELETS: 399 10*3/uL (ref 140–400)

## 2015-12-24 ENCOUNTER — Telehealth: Payer: Self-pay | Admitting: *Deleted

## 2015-12-24 NOTE — Telephone Encounter (Signed)
Patient has requested lab results from 06-06 [phone]

## 2015-12-24 NOTE — Telephone Encounter (Signed)
Patient is aware of lab results.

## 2016-04-05 ENCOUNTER — Ambulatory Visit (INDEPENDENT_AMBULATORY_CARE_PROVIDER_SITE_OTHER): Payer: PPO | Admitting: Internal Medicine

## 2016-04-05 ENCOUNTER — Encounter: Payer: Self-pay | Admitting: Internal Medicine

## 2016-04-05 DIAGNOSIS — E7800 Pure hypercholesterolemia, unspecified: Secondary | ICD-10-CM

## 2016-04-05 DIAGNOSIS — Z720 Tobacco use: Secondary | ICD-10-CM

## 2016-04-05 DIAGNOSIS — D473 Essential (hemorrhagic) thrombocythemia: Secondary | ICD-10-CM | POA: Diagnosis not present

## 2016-04-05 DIAGNOSIS — D75839 Thrombocytosis, unspecified: Secondary | ICD-10-CM

## 2016-04-05 NOTE — Progress Notes (Signed)
[name], female   DOB: 1945/07/30, 70 y.o.   MRN: [id number]   [name], female    DOB: 1946/03/27, 70 y.o.   MRN: [id number]  HPI  Patient here for a scheduled follow up.  She states she is doing relatively well.  Some increased stress with her husband's medical issues, but overall doing well.  Stays active.  No chest pain.  No sob.  No acid reflux.  No abdominal pain or cramping.  Bowel stable.  Still smoking.  Discussed the need to quit.  Discussed screening CT for lung cancer.    Past Medical History:  Diagnosis Date  . Chronic bronchitis ([place])   . Hypercholesterolemia   . Migraines    Past Surgical History:  Procedure Laterality Date  . ABDOMINAL HYSTERECTOMY  1979   secondary to fibroids, incidental appendectomy  . BREAST BIOPSY  1979   benign  . TONSILLECTOMY  1960   Family History  Problem Relation Age of Onset  . Heart disease Father     myocardial infarction - died age 92  . Heart disease Mother   . Diabetes Mother   . Diabetes Maternal Grandmother   . Breast cancer Neg Hx   . Colon cancer Neg Hx    Social History   Social History  . Marital status: Married    Spouse name: N/A  . Number of children: 1  . Years of education: N/A   Social History Main Topics  . Smoking status: Current Every Day Smoker    Packs/day: 1.00  . Smokeless tobacco: Never Used     Comment: is going to try electronic cigarettes  . Alcohol use No  . Drug use: No  . Sexual activity: No   Other Topics Concern  . None   Social History Narrative  . None    Outpatient Encounter Prescriptions as of 04/05/2016  Medication Sig  . atorvastatin (LIPITOR) 20 MG tablet TAKE ONE (1) TABLET BY MOUTH EVERY DAY   No facility-administered encounter medications on file as of 04/05/2016.     Review of Systems  Constitutional: Negative for appetite change and unexpected weight change.  HENT: Negative for congestion and sinus pressure.    Respiratory: Negative for cough, chest tightness and shortness of breath.   Cardiovascular: Negative for chest pain, palpitations and leg swelling.  Gastrointestinal: Negative for abdominal pain, diarrhea, nausea and vomiting.  Genitourinary: Negative for difficulty urinating and dysuria.  Musculoskeletal: Negative for back pain and joint swelling.  Skin: Negative for color change and rash.  Neurological: Negative for dizziness, light-headedness and headaches.  Psychiatric/Behavioral: Negative for agitation and dysphoric mood.       Objective:    Physical Exam  Constitutional: She appears well-developed and well-nourished. No distress.  HENT:  Nose: Nose normal.  Mouth/Throat: Oropharynx is clear and moist.  Neck: Neck supple. No thyromegaly present.  Cardiovascular: Normal rate and regular rhythm.   Pulmonary/Chest: Breath sounds normal. No respiratory distress. She has no wheezes.  Abdominal: Soft. Bowel sounds are normal. There is no tenderness.  Musculoskeletal: She exhibits no edema or tenderness.  Lymphadenopathy:    She has no cervical adenopathy.  Skin: No rash noted. No erythema.  Psychiatric: She has a normal mood and affect. Her behavior is normal.    BP 122/70   Pulse 86   Temp 97.6 F (36.4 C) (Oral)   Ht 5\' 2"  (1.575 m)   Wt 148 lb 9.6  oz (67.4 kg)   SpO2 96%   BMI 27.18 kg/m  Wt Readings from Last 3 Encounters:  04/05/16 148 lb 9.6 oz (67.4 kg)  12/02/15 150 lb 2 oz (68.1 kg)  08/27/15 150 lb 6.4 oz (68.2 kg)     Lab Results  Component Value Date   WBC 7.8 11/24/2015   HGB 13.9 11/24/2015   HCT 41.0 11/24/2015   PLT 399 12/23/2015   GLUCOSE 99 11/24/2015   CHOL 196 11/24/2015   TRIG 170.0 (H) 11/24/2015   HDL 66.50 11/24/2015   LDLDIRECT 90.0 07/30/2015   LDLCALC 95 11/24/2015   ALT 16 11/24/2015   AST 17 11/24/2015   NA 140 11/24/2015   K 4.1 11/24/2015   CL 105 11/24/2015   CREATININE 0.69 11/24/2015   BUN 13 11/24/2015   CO2 27  11/24/2015   TSH 2.37 11/24/2015       Assessment & Plan:   Problem List Items Addressed This Visit    Hypercholesterolemia    On lipitor.  Low cholesterol diet and exercise.  Follow lipid panel and liver function tests.        Relevant Orders   Lipid panel   Hepatic function panel   Basic metabolic panel   Thrombocytosis ([facility])    Slight increased on previous check.  Recheck wnl.  Follow.       Tobacco abuse    Discussed with her today.  She desires not to stop.  Discussed screening CT for lung cancer.  She will notify me if agreeable.         Other Visit Diagnoses   None.      Einar Pheasant, MD

## 2016-04-05 NOTE — Progress Notes (Signed)
Pre visit review using our clinic review tool, if applicable. No additional management support is needed unless otherwise documented below in the visit note. 

## 2016-04-06 ENCOUNTER — Encounter: Payer: Self-pay | Admitting: Internal Medicine

## 2016-04-06 NOTE — Assessment & Plan Note (Addendum)
Slight increased on previous check.  Recheck wnl.  Follow.

## 2016-04-06 NOTE — Assessment & Plan Note (Signed)
On lipitor.  Low cholesterol diet and exercise.  Follow lipid panel and liver function tests.   

## 2016-04-06 NOTE — Assessment & Plan Note (Signed)
Discussed with her today.  She desires not to stop.  Discussed screening CT for lung cancer.  She will notify me if agreeable.

## 2016-05-10 ENCOUNTER — Ambulatory Visit (INDEPENDENT_AMBULATORY_CARE_PROVIDER_SITE_OTHER): Payer: PPO | Admitting: Internal Medicine

## 2016-05-10 ENCOUNTER — Encounter: Payer: Self-pay | Admitting: Internal Medicine

## 2016-05-10 ENCOUNTER — Other Ambulatory Visit (INDEPENDENT_AMBULATORY_CARE_PROVIDER_SITE_OTHER): Payer: PPO

## 2016-05-10 DIAGNOSIS — J069 Acute upper respiratory infection, unspecified: Secondary | ICD-10-CM

## 2016-05-10 DIAGNOSIS — R030 Elevated blood-pressure reading, without diagnosis of hypertension: Secondary | ICD-10-CM

## 2016-05-10 DIAGNOSIS — E7800 Pure hypercholesterolemia, unspecified: Secondary | ICD-10-CM | POA: Diagnosis not present

## 2016-05-10 LAB — LIPID PANEL
CHOL/HDL RATIO: 3
Cholesterol: 177 mg/dL (ref 0–200)
HDL: 60.7 mg/dL (ref >39.00)
LDL Cholesterol: 78 mg/dL (ref 0–99)
NONHDL: 116.35
Triglycerides: 193 mg/dL — ABNORMAL HIGH (ref 0.0–149.0)
VLDL: 38.6 mg/dL (ref 0.0–40.0)

## 2016-05-10 LAB — HEPATIC FUNCTION PANEL
ALBUMIN: 4.4 g/dL (ref 3.5–5.2)
ALT: 15 U/L (ref 0–35)
AST: 15 U/L (ref 0–37)
Alkaline Phosphatase: 97 U/L (ref 39–117)
BILIRUBIN TOTAL: 0.5 mg/dL (ref 0.2–1.2)
Bilirubin, Direct: 0.1 mg/dL (ref 0.0–0.3)
Total Protein: 6.7 g/dL (ref 6.0–8.3)

## 2016-05-10 LAB — BASIC METABOLIC PANEL
BUN: 10 mg/dL (ref 6–23)
CHLORIDE: 105 meq/L (ref 96–112)
CO2: 32 meq/L (ref 19–32)
Calcium: 9.7 mg/dL (ref 8.4–10.5)
Creatinine, Ser: 0.74 mg/dL (ref 0.40–1.20)
GFR: 81.94 mL/min (ref >60.00)
GLUCOSE: 85 mg/dL (ref 70–99)
POTASSIUM: 4.8 meq/L (ref 3.5–5.1)
SODIUM: 142 meq/L (ref 135–145)

## 2016-05-10 MED ORDER — CEFUROXIME AXETIL 250 MG PO TABS: 250.0000 mg | ORAL_TABLET | Freq: Two times a day (BID) | ORAL | 0 refills | Status: DC

## 2016-05-10 MED ORDER — PREDNISONE 10 MG PO TABS: ORAL_TABLET | ORAL | 0 refills | Status: DC

## 2016-05-10 NOTE — Progress Notes (Signed)
Pre visit review using our clinic review tool, if applicable. No additional management support is needed unless otherwise documented below in the visit note. 

## 2016-05-10 NOTE — Patient Instructions (Signed)
Saline nasal spray - flush nose at least 2-3x/day  nasacort nasal spray - 2 sprays each nostril one time per day.  Do this in the evening.    Take the prednisone as directed.    If you take the antibiotic, take a probiotic daily while you are on the antibiotic and for two weeks after you complete the antibiotic.    mucinex DM in the am and robitussin DM in the evening.

## 2016-05-10 NOTE — Progress Notes (Signed)
[name], female   DOB: [birth year], 70 y.o.   MRN: [id number]   [name], female    DOB: [birth year], 70 y.o.   MRN: [id number]  HPI  Patient here as a work in with concerns regarding increased sinus congestion.  She reports that symptoms started last week.  Started with sneezing.  Progressed.  Some increased nasal congestion and drainage.  Some increased chest congestion and cough.  Throat congestion.  Some coughing fits.  She also reports some diarrhea yesterday.  Several episodes yesterday.  Two soft stools this am.  No fever.  No aching.  She is eating.    Past Medical History:  Diagnosis Date  . Chronic bronchitis ([place])   . Hypercholesterolemia   . Migraines    Past Surgical History:  Procedure Laterality Date  . ABDOMINAL HYSTERECTOMY  1979   secondary to fibroids, incidental appendectomy  . BREAST BIOPSY  1979   benign  . TONSILLECTOMY  1960   Family History  Problem Relation Age of Onset  . Heart disease Father     myocardial infarction - died age 32  . Heart disease Mother   . Diabetes Mother   . Diabetes Maternal Grandmother   . Breast cancer Neg Hx   . Colon cancer Neg Hx    Social History   Social History  . Marital status: Married    Spouse name: N/A  . Number of children: 1  . Years of education: N/A   Social History Main Topics  . Smoking status: Current Every Day Smoker    Packs/day: 1.00  . Smokeless tobacco: Never Used     Comment: is going to try electronic cigarettes  . Alcohol use No  . Drug use: No  . Sexual activity: No   Other Topics Concern  . None   Social History Narrative  . None    Outpatient Encounter Prescriptions as of 05/10/2016  Medication Sig  . atorvastatin (LIPITOR) 20 MG tablet TAKE ONE (1) TABLET BY MOUTH EVERY DAY  . cefUROXime (CEFTIN) 250 MG tablet Take 1 tablet (250 mg total) by mouth 2 (two) times daily with a meal.  . predniSONE (DELTASONE) 10 MG tablet Take  6 tablets x 1 day and then decrease by 1/2 tablet per day until down to zero mg.   No facility-administered encounter medications on file as of 05/10/2016.     Review of Systems  Constitutional: Negative for appetite change and fever.  HENT: Positive for congestion and postnasal drip.   Respiratory: Positive for cough. Negative for chest tightness and shortness of breath.   Cardiovascular: Negative for chest pain and leg swelling.  Gastrointestinal: Positive for diarrhea. Negative for nausea and vomiting.  Skin: Negative for color change and rash.  Neurological: Negative for light-headedness and headaches.       Objective:     Blood pressure rechecked by me:  [id number]  Physical Exam  Constitutional: She appears well-developed and well-nourished. No distress.  HENT:  Mouth/Throat: Oropharynx is clear and moist.  Nares - slightly erythematous turbinates.    Neck: Neck supple.  Cardiovascular: Normal rate and regular rhythm.   Pulmonary/Chest: Breath sounds normal. No respiratory distress. She has no wheezes.  Increased cough with forced expiration.    Lymphadenopathy:    She has no cervical adenopathy.    BP (!) 140/98   Pulse 78   Temp 98.4 F (36.9 C) (Oral)   Wt 148 lb  3.2 oz (67.2 kg)   SpO2 92%   BMI 27.11 kg/m  Wt Readings from Last 3 Encounters:  05/10/16 148 lb 3.2 oz (67.2 kg)  04/05/16 148 lb 9.6 oz (67.4 kg)  12/02/15 150 lb 2 oz (68.1 kg)     Lab Results  Component Value Date   WBC 7.8 11/24/2015   HGB 13.9 11/24/2015   HCT 41.0 11/24/2015   PLT 399 12/23/2015   GLUCOSE 85 05/10/2016   CHOL 177 05/10/2016   TRIG 193.0 (H) 05/10/2016   HDL 60.70 05/10/2016   LDLDIRECT 90.0 07/30/2015   LDLCALC 78 05/10/2016   ALT 15 05/10/2016   AST 15 05/10/2016   NA 142 05/10/2016   K 4.8 05/10/2016   CL 105 05/10/2016   CREATININE 0.74 05/10/2016   BUN 10 05/10/2016   CO2 32 05/10/2016   TSH 2.37 11/24/2015        Assessment & Plan:   Problem  List Items Addressed This Visit    Elevated blood pressure reading    Blood pressure on recheck improved.  Follow.        URI (upper respiratory infection)    Symptoms and exam as outlined.  Treat with saline nasal spray and nasacort nasal spray as outlined.  mucinex and robitussin as directed.  Treat with prednisone taper.  rx given for abx if symptoms worsened.  Notify me if symptoms persist.        Relevant Medications   cefUROXime (CEFTIN) 250 MG tablet    Other Visit Diagnoses   None.      Einar Pheasant, MD

## 2016-05-11 ENCOUNTER — Encounter: Payer: Self-pay | Admitting: Internal Medicine

## 2016-05-11 NOTE — Assessment & Plan Note (Signed)
Blood pressure on recheck improved.  Follow.   

## 2016-05-11 NOTE — Assessment & Plan Note (Signed)
Symptoms and exam as outlined.  Treat with saline nasal spray and nasacort nasal spray as outlined.  mucinex and robitussin as directed.  Treat with prednisone taper.  rx given for abx if symptoms worsened.  Notify me if symptoms persist.

## 2016-06-03 ENCOUNTER — Ambulatory Visit: Payer: PPO | Admitting: Internal Medicine

## 2016-06-14 DIAGNOSIS — Z1231 Encounter for screening mammogram for malignant neoplasm of breast: Secondary | ICD-10-CM | POA: Diagnosis not present

## 2016-06-14 LAB — HM MAMMOGRAPHY

## 2016-06-15 ENCOUNTER — Encounter: Payer: Self-pay | Admitting: Internal Medicine

## 2016-06-21 ENCOUNTER — Encounter: Payer: Self-pay | Admitting: Internal Medicine

## 2016-06-22 DIAGNOSIS — H2513 Age-related nuclear cataract, bilateral: Secondary | ICD-10-CM | POA: Diagnosis not present

## 2016-08-17 ENCOUNTER — Other Ambulatory Visit: Payer: Self-pay | Admitting: Internal Medicine

## 2016-08-27 ENCOUNTER — Ambulatory Visit: Payer: PPO

## 2016-08-30 ENCOUNTER — Other Ambulatory Visit: Payer: Self-pay | Admitting: Radiology

## 2016-08-30 ENCOUNTER — Other Ambulatory Visit: Payer: Self-pay | Admitting: Family Medicine

## 2016-08-30 ENCOUNTER — Ambulatory Visit (INDEPENDENT_AMBULATORY_CARE_PROVIDER_SITE_OTHER): Payer: PPO

## 2016-08-30 VITALS — BP 134/78 | HR 78 | Temp 98.0°F | Resp 14 | Ht 62.0 in | Wt 148.1 lb

## 2016-08-30 DIAGNOSIS — Z0000 Encounter for general adult medical examination without abnormal findings: Secondary | ICD-10-CM

## 2016-08-30 DIAGNOSIS — R300 Dysuria: Secondary | ICD-10-CM

## 2016-08-30 LAB — POCT URINALYSIS DIPSTICK
Bilirubin, UA: NEGATIVE
Glucose, UA: NEGATIVE
KETONES UA: NEGATIVE
Nitrite, UA: POSITIVE
PH UA: 6
PROTEIN UA: 30
Spec Grav, UA: 1.01
UROBILINOGEN UA: 0.2

## 2016-08-30 MED ORDER — SULFAMETHOXAZOLE-TRIMETHOPRIM 800-160 MG PO TABS: 1.0000 | ORAL_TABLET | Freq: Two times a day (BID) | ORAL | 0 refills | Status: DC

## 2016-08-30 NOTE — Patient Instructions (Addendum)
  Monica Morales , Thank you for taking time to come for your Medicare Wellness Visit. I appreciate your ongoing commitment to your health goals. Please review the following plan we discussed and let me know if I can assist you in the future.   Follow up with Dr. Nicki Reaper as needed.  These are the goals we discussed: Goals    . Decrease smoking activity          Current everyday smoker.  Not ready to quit, but will try to decrease the daily amount by 1.  Currently has a fear of weight gain.  Educational material provided.    . Increase physical activity          Start walking outside and around the house 2 times weekly, as tolerated. Wear foot braces and proper shoes.       This is a list of the screening recommended for you and due dates:  Health Maintenance  Topic Date Due  .  Hepatitis C: One time screening is recommended by Center for Disease Control  (CDC) for  adults born from [birth year] through 1967.   [birth year]  . Tetanus Vaccine  02/12/1963  . Colon Cancer Screening  02/11/1994  . Shingles Vaccine  02/12/2004  . Pneumonia vaccines (1 of 2 - PCV13) 02/11/2009  . Flu Shot  04/05/2017*  . Mammogram  06/14/2018  . DEXA scan (bone density measurement)  Completed  *Topic was postponed. The date shown is not the original due date.

## 2016-08-30 NOTE — Progress Notes (Signed)
Subjective:   Monica Morales is a 71 y.o. female who presents for Medicare Annual (Subsequent) preventive examination.  Review of Systems:  No ROS.  Medicare Wellness Visit.  Cardiac Risk Factors include: advanced age (>75men, >45 women)     Objective:     Vitals: BP 134/78 (BP Location: Right Arm, Patient Position: Sitting, Cuff Size: Normal)   Pulse 78   Temp 98 F (36.7 C) (Oral)   Resp 14   Ht 5\' 2"  (1.575 m)   Wt 148 lb 1.9 oz (67.2 kg)   SpO2 98%   BMI 27.09 kg/m   Body mass index is 27.09 kg/m.   Tobacco History  Smoking Status  . Current Every Day Smoker  . Packs/day: 1.00  Smokeless Tobacco  . Never Used    Comment: is going to try electronic cigarettes     Ready to quit: Not Answered Counseling given: Not Answered   Past Medical History:  Diagnosis Date  . Chronic bronchitis ([place])   . Hypercholesterolemia   . Migraines    Past Surgical History:  Procedure Laterality Date  . ABDOMINAL HYSTERECTOMY  1979   secondary to fibroids, incidental appendectomy  . BREAST BIOPSY  1979   benign  . TONSILLECTOMY  1960   Family History  Problem Relation Age of Onset  . Heart disease Father     myocardial infarction - died age 40  . Heart disease Mother   . Diabetes Mother   . Diabetes Maternal Grandmother   . Breast cancer Neg Hx   . Colon cancer Neg Hx    History  Sexual Activity  . Sexual activity: No    Outpatient Encounter Prescriptions as of 08/30/2016  Medication Sig  . atorvastatin (LIPITOR) 20 MG tablet TAKE ONE (1) TABLET BY MOUTH EVERY DAY  . [DISCONTINUED] cefUROXime (CEFTIN) 250 MG tablet Take 1 tablet (250 mg total) by mouth 2 (two) times daily with a meal.  . [DISCONTINUED] predniSONE (DELTASONE) 10 MG tablet Take 6 tablets x 1 day and then decrease by 1/2 tablet per day until down to zero mg.   No facility-administered encounter medications on file as of 08/30/2016.     Activities of Daily Living In your present state of  health, do you have any difficulty performing the following activities: 08/30/2016  Hearing? N  Vision? N  Difficulty concentrating or making decisions? N  Walking or climbing stairs? Y  Dressing or bathing? N  Doing errands, shopping? N  Preparing Food and eating ? N  Using the Toilet? N  In the past six months, have you accidently leaked urine? N  Do you have problems with loss of bowel control? N  Managing your Medications? N  Managing your Finances? N  Housekeeping or managing your Housekeeping? N  Some recent data might be hidden    Patient Care Team: Einar Pheasant, MD as PCP - General (Internal Medicine)    Assessment:    This is a routine wellness examination for Monica Morales. The goal of the wellness visit is to assist the patient how to close the gaps in care and create a preventative care plan for the patient.   Osteoporosis risk reviewed.  Medications reviewed; taking without issues or barriers.  Safety issues reviewed; smoke detectors in the home. No firearms in the home. Wears seatbelts when driving or riding with others. No violence in the home.  No identified risk were noted; The patient was oriented x 3; appropriate in dress and manner and  no objective failures at ADL's or IADL's.   BMI; discussed the importance of a healthy diet, water intake and exercise. Educational material provided.  Colonoscopy deferred per patient request; educational material provided.  TDAP, ZOSTAVAX, Prevnar 13 vaccine declined at this time per patient preference. Educational material provided.  Hepatitis C screening discussed; educational material provided.  Patient Concerns: Urinary frequency, low back pain, pressure when urinating; onset 1 week.  POCT per verbal order.    Exercise Activities and Dietary recommendations Current Exercise Habits: The patient does not participate in regular exercise at present  Goals    . Decrease smoking activity          Current everyday  smoker.  Not ready to quit, but will try to decrease the daily amount by 1.  Currently has a fear of weight gain.  Educational material provided.    . Increase physical activity          Start walking outside and around the house 2 times weekly, as tolerated. Wear foot braces and proper shoes.      Fall Risk Fall Risk  08/30/2016 05/10/2016 04/05/2016 08/27/2015 08/01/2015  Falls in the past year? No No No No No   Depression Screen PHQ 2/9 Scores 08/30/2016 05/10/2016 04/05/2016 08/27/2015  PHQ - 2 Score 0 0 0 0     Cognitive Function MMSE - Mini Mental State Exam 08/27/2015  Orientation to time 5  Orientation to Place 5  Registration 3  Attention/ Calculation 5  Recall 3  Language- name 2 objects 2  Language- repeat 1  Language- follow 3 step command 3  Language- read & follow direction 1  Write a sentence 1  Copy design 1  Total score 30     6CIT Screen 08/30/2016  What Year? 0 points  What month? 0 points  What time? 0 points  Count back from 20 0 points  Months in reverse 0 points  Repeat phrase 0 points  Total Score 0     There is no immunization history on file for this patient. Screening Tests Health Maintenance  Topic Date Due  . Hepatitis C Screening  1945-09-11  . TETANUS/TDAP  02/12/1963  . COLONOSCOPY  02/11/1994  . ZOSTAVAX  02/12/2004  . PNA vac Low Risk Adult (1 of 2 - PCV13) 02/11/2009  . INFLUENZA VACCINE  04/05/2017 (Originally 02/17/2016)  . MAMMOGRAM  06/14/2018  . DEXA SCAN  Completed      Plan:    End of life planning; Advance aging; Advanced directives discussed. Copy of current HCPOA/Living Will requested.  Medicare Attestation I have personally reviewed: The patient's medical and social history Their use of alcohol, tobacco or illicit drugs Their current medications and supplements The patient's functional ability including ADLs,fall risks, home safety risks, cognitive, and hearing and visual impairment Diet and physical  activities Evidence for depression   The patient's weight, height, BMI, and visual acuity have been recorded in the chart.  I have made referrals and provided education to the patient based on review of the above and I have provided the patient with a written personalized care plan for preventive services.    During the course of the visit the patient was educated and counseled about the following appropriate screening and preventive services:   Vaccines to include Pneumoccal, Influenza, Hepatitis B, Td, Zostavax, HCV  Electrocardiogram  Cardiovascular Disease  Colorectal cancer screening  Bone density screening  Diabetes screening  Glaucoma screening  Mammography/PAP  Nutrition counseling   Patient  Instructions (the written plan) was given to the patient.   Varney Biles, LPN  [id number]

## 2016-08-31 ENCOUNTER — Encounter: Payer: PPO | Admitting: Internal Medicine

## 2016-09-01 LAB — URINE CULTURE

## 2016-09-13 ENCOUNTER — Ambulatory Visit (INDEPENDENT_AMBULATORY_CARE_PROVIDER_SITE_OTHER): Payer: PPO | Admitting: Internal Medicine

## 2016-09-13 ENCOUNTER — Encounter: Payer: Self-pay | Admitting: Internal Medicine

## 2016-09-13 DIAGNOSIS — M79672 Pain in left foot: Secondary | ICD-10-CM

## 2016-09-13 DIAGNOSIS — E7800 Pure hypercholesterolemia, unspecified: Secondary | ICD-10-CM

## 2016-09-13 DIAGNOSIS — Z0000 Encounter for general adult medical examination without abnormal findings: Secondary | ICD-10-CM

## 2016-09-13 DIAGNOSIS — Z720 Tobacco use: Secondary | ICD-10-CM

## 2016-09-13 DIAGNOSIS — D75839 Thrombocytosis, unspecified: Secondary | ICD-10-CM

## 2016-09-13 DIAGNOSIS — D473 Essential (hemorrhagic) thrombocythemia: Secondary | ICD-10-CM | POA: Diagnosis not present

## 2016-09-13 DIAGNOSIS — M79671 Pain in right foot: Secondary | ICD-10-CM

## 2016-09-13 NOTE — Progress Notes (Signed)
Care was provided under my supervision. I agree with the management as indicated in the note.  Bacilio Abascal DO  

## 2016-09-13 NOTE — Progress Notes (Signed)
Pre-visit discussion using our clinic review tool. No additional management support is needed unless otherwise documented below in the visit note.  

## 2016-09-13 NOTE — Assessment & Plan Note (Addendum)
Physical today 09/13/16.  Mammogram 06/14/16 - Birads II.  Colonoscopy 12/2012 - diverticulosis.

## 2016-09-13 NOTE — Progress Notes (Signed)
[name], female   DOB: 01/17/1946, 71 y.o.   MRN: [id number]   [name], female    DOB: [birth year], 71 y.o.   MRN: [id number]  HPI  Patient with past history of chronic bronchitis and hypercholesterolemia.  She comes in today to follow up on these issues as well as for a complete physical exam.  States she is doing relatively well.  Tries to stay active.  No chest pain.  No sob.  No acid reflux.  No abdominal pain or cramping.  Bowels stable.  Has been having problems with her feet.  Has seen Dr Milinda Pointer.  S/p cortisone injections and orthotics.  Some pain left shoulder.  Desires no further intervention.    Past Medical History:  Diagnosis Date  . Chronic bronchitis ([place])   . Hypercholesterolemia   . Migraines    Past Surgical History:  Procedure Laterality Date  . ABDOMINAL HYSTERECTOMY  1979   secondary to fibroids, incidental appendectomy  . BREAST BIOPSY  1979   benign  . TONSILLECTOMY  1960   Family History  Problem Relation Age of Onset  . Heart disease Father     myocardial infarction - died age 42  . Heart disease Mother   . Diabetes Mother   . Diabetes Maternal Grandmother   . Breast cancer Neg Hx   . Colon cancer Neg Hx    Social History   Social History  . Marital status: Married    Spouse name: N/A  . Number of children: 1  . Years of education: N/A   Social History Main Topics  . Smoking status: Current Every Day Smoker    Packs/day: 1.00  . Smokeless tobacco: Never Used     Comment: is going to try electronic cigarettes  . Alcohol use No  . Drug use: No  . Sexual activity: No   Other Topics Concern  . None   Social History Narrative  . None    Outpatient Encounter Prescriptions as of 09/13/2016  Medication Sig  . atorvastatin (LIPITOR) 20 MG tablet TAKE ONE (1) TABLET BY MOUTH EVERY DAY  . [DISCONTINUED] sulfamethoxazole-trimethoprim (BACTRIM DS,SEPTRA DS) 800-160 MG tablet Take 1 tablet by  mouth 2 (two) times daily. (Patient not taking: Reported on 09/13/2016)   No facility-administered encounter medications on file as of 09/13/2016.     Review of Systems  Constitutional: Negative for appetite change and unexpected weight change.  HENT: Negative for congestion and sinus pressure.   Eyes: Negative for pain and visual disturbance.  Respiratory: Negative for cough, chest tightness and shortness of breath.   Cardiovascular: Negative for chest pain, palpitations and leg swelling.  Gastrointestinal: Negative for abdominal pain, diarrhea, nausea and vomiting.  Genitourinary: Negative for difficulty urinating and dysuria.  Musculoskeletal: Negative for back pain and joint swelling.  Skin: Negative for color change and rash.  Neurological: Negative for dizziness, light-headedness and headaches.  Hematological: Negative for adenopathy. Does not bruise/bleed easily.  Psychiatric/Behavioral: Negative for agitation and dysphoric mood.       Objective:    Physical Exam  Constitutional: She is oriented to person, place, and time. She appears well-developed and well-nourished. No distress.  HENT:  Nose: Nose normal.  Mouth/Throat: Oropharynx is clear and moist.  Eyes: Right eye exhibits no discharge. Left eye exhibits no discharge. No scleral icterus.  Neck: Neck supple. No thyromegaly present.  Cardiovascular: Normal rate and regular rhythm.   Pulmonary/Chest: Breath sounds  normal. No accessory muscle usage. No tachypnea. No respiratory distress. She has no decreased breath sounds. She has no wheezes. She has no rhonchi. Right breast exhibits no inverted nipple, no mass, no nipple discharge and no tenderness (no axillary adenopathy). Left breast exhibits no inverted nipple, no mass, no nipple discharge and no tenderness (no axilarry adenopathy).  Abdominal: Soft. Bowel sounds are normal. There is no tenderness.  Musculoskeletal: She exhibits no edema or tenderness.  Lymphadenopathy:      She has no cervical adenopathy.  Neurological: She is alert and oriented to person, place, and time.  Skin: Skin is warm. No rash noted. No erythema.  Psychiatric: She has a normal mood and affect. Her behavior is normal.    BP 126/78 (BP Location: Left Arm, Patient Position: Sitting, Cuff Size: Large)   Pulse 85   Temp 98.7 F (37.1 C) (Oral)   Ht 5\' 2"  (1.575 m)   Wt 150 lb 3.2 oz (68.1 kg)   SpO2 95%   BMI 27.47 kg/m  Wt Readings from Last 3 Encounters:  09/13/16 150 lb 3.2 oz (68.1 kg)  08/30/16 148 lb 1.9 oz (67.2 kg)  05/10/16 148 lb 3.2 oz (67.2 kg)     Lab Results  Component Value Date   WBC 7.8 11/24/2015   HGB 13.9 11/24/2015   HCT 41.0 11/24/2015   PLT 399 12/23/2015   GLUCOSE 85 05/10/2016   CHOL 177 05/10/2016   TRIG 193.0 (H) 05/10/2016   HDL 60.70 05/10/2016   LDLDIRECT 90.0 07/30/2015   LDLCALC 78 05/10/2016   ALT 15 05/10/2016   AST 15 05/10/2016   NA 142 05/10/2016   K 4.8 05/10/2016   CL 105 05/10/2016   CREATININE 0.74 05/10/2016   BUN 10 05/10/2016   CO2 32 05/10/2016   TSH 2.37 11/24/2015       Assessment & Plan:   Problem List Items Addressed This Visit    Foot pain, bilateral    Has seen podiatry.  Desires no further intervention at this time.  Follow.        Health care maintenance    Physical today 09/13/16.  Mammogram 06/14/16 - Birads II.  Colonoscopy 12/2012 - diverticulosis.       Hypercholesterolemia    On lipitor.  Low cholesterol diet and exercise.  Follow  Lipid panel and liver function tests.        Relevant Orders   Hepatic function panel   Lipid panel   Basic metabolic panel   Thrombocytosis ([place])    Last checked 12/23/15 - wnl.  Follow.       Relevant Orders   CBC with Differential/Platelet   Tobacco abuse    Discussed with her again today regarding the need to stop smoking.  She declines.            Einar Pheasant, MD

## 2016-09-25 NOTE — Assessment & Plan Note (Signed)
Has seen podiatry.  Desires no further intervention at this time.  Follow.

## 2016-09-25 NOTE — Assessment & Plan Note (Signed)
Discussed with her again today regarding the need to stop smoking.  She declines.

## 2016-09-25 NOTE — Assessment & Plan Note (Signed)
Last checked 12/23/15 - wnl.  Follow.

## 2016-09-25 NOTE — Assessment & Plan Note (Signed)
On lipitor.  Low cholesterol diet and exercise.  Follow  Lipid panel and liver function tests.

## 2016-09-29 ENCOUNTER — Other Ambulatory Visit (INDEPENDENT_AMBULATORY_CARE_PROVIDER_SITE_OTHER): Payer: PPO

## 2016-09-29 DIAGNOSIS — D75839 Thrombocytosis, unspecified: Secondary | ICD-10-CM

## 2016-09-29 DIAGNOSIS — E7800 Pure hypercholesterolemia, unspecified: Secondary | ICD-10-CM | POA: Diagnosis not present

## 2016-09-29 DIAGNOSIS — D473 Essential (hemorrhagic) thrombocythemia: Secondary | ICD-10-CM

## 2016-09-29 LAB — CBC WITH DIFFERENTIAL/PLATELET
Basophils Absolute: 0.1 10*3/uL (ref 0.0–0.1)
Basophils Relative: 0.7 % (ref 0.0–3.0)
EOS ABS: 0.3 10*3/uL (ref 0.0–0.7)
Eosinophils Relative: 2.8 % (ref 0.0–5.0)
HCT: 44 % (ref 36.0–46.0)
Hemoglobin: 14.7 g/dL (ref 12.0–15.0)
LYMPHS PCT: 34.1 % (ref 12.0–46.0)
Lymphs Abs: 3.1 10*3/uL (ref 0.7–4.0)
MCHC: 33.5 g/dL (ref 30.0–36.0)
MCV: 94.6 fl (ref 78.0–100.0)
MONOS PCT: 8.4 % (ref 3.0–12.0)
Monocytes Absolute: 0.8 10*3/uL (ref 0.1–1.0)
Neutro Abs: 5 10*3/uL (ref 1.4–7.7)
Neutrophils Relative %: 54 % (ref 43.0–77.0)
Platelets: 440 10*3/uL — ABNORMAL HIGH (ref 150.0–400.0)
RBC: 4.65 Mil/uL (ref 3.87–5.11)
RDW: 13.5 % (ref 11.5–15.5)
WBC: 9.2 10*3/uL (ref 4.0–10.5)

## 2016-09-29 LAB — HEPATIC FUNCTION PANEL
ALBUMIN: 4.8 g/dL (ref 3.5–5.2)
ALK PHOS: 83 U/L (ref 39–117)
ALT: 18 U/L (ref 0–35)
AST: 17 U/L (ref 0–37)
BILIRUBIN DIRECT: 0.1 mg/dL (ref 0.0–0.3)
TOTAL PROTEIN: 7.3 g/dL (ref 6.0–8.3)
Total Bilirubin: 0.6 mg/dL (ref 0.2–1.2)

## 2016-09-29 LAB — LIPID PANEL
Cholesterol: 202 mg/dL — ABNORMAL HIGH (ref 0–200)
HDL: 69.2 mg/dL (ref >39.00)
NONHDL: 132.8
TRIGLYCERIDES: 239 mg/dL — AB (ref 0.0–149.0)
Total CHOL/HDL Ratio: 3
VLDL: 47.8 mg/dL — ABNORMAL HIGH (ref 0.0–40.0)

## 2016-09-29 LAB — BASIC METABOLIC PANEL
BUN: 16 mg/dL (ref 6–23)
CHLORIDE: 106 meq/L (ref 96–112)
CO2: 28 meq/L (ref 19–32)
Calcium: 10.2 mg/dL (ref 8.4–10.5)
Creatinine, Ser: 0.71 mg/dL (ref 0.40–1.20)
GFR: 85.85 mL/min (ref >60.00)
GLUCOSE: 98 mg/dL (ref 70–99)
Potassium: 4.5 mEq/L (ref 3.5–5.1)
SODIUM: 141 meq/L (ref 135–145)

## 2016-09-29 LAB — LDL CHOLESTEROL, DIRECT: Direct LDL: 81 mg/dL

## 2016-09-30 ENCOUNTER — Other Ambulatory Visit: Payer: Self-pay | Admitting: Internal Medicine

## 2016-09-30 DIAGNOSIS — D473 Essential (hemorrhagic) thrombocythemia: Secondary | ICD-10-CM

## 2016-09-30 DIAGNOSIS — D75839 Thrombocytosis, unspecified: Secondary | ICD-10-CM

## 2016-09-30 NOTE — Progress Notes (Signed)
Order placed for f/u platelet count.  

## 2016-10-25 ENCOUNTER — Other Ambulatory Visit (INDEPENDENT_AMBULATORY_CARE_PROVIDER_SITE_OTHER): Payer: PPO

## 2016-10-25 DIAGNOSIS — D75839 Thrombocytosis, unspecified: Secondary | ICD-10-CM

## 2016-10-25 DIAGNOSIS — D473 Essential (hemorrhagic) thrombocythemia: Secondary | ICD-10-CM

## 2016-10-25 LAB — PLATELET COUNT: Platelets: 395 10*3/uL (ref 140–400)

## 2016-12-21 ENCOUNTER — Telehealth: Payer: Self-pay | Admitting: *Deleted

## 2016-12-21 NOTE — Telephone Encounter (Signed)
App made with patient for another provider

## 2016-12-21 NOTE — Telephone Encounter (Signed)
Please advise can I see if she would like to see another provider?

## 2016-12-21 NOTE — Telephone Encounter (Signed)
Patient requested to have Dr Nicki Reaper examine her right leg , she currently has a small black spot , surrounded by red swelling in the form of a bump , the size of a dime. Pt feels that she could have been bitten by an insect. This is a 2 month issue , pt has used neosporin cream. Area has no itching, drainage or warmth to touch, however the area has soreness at times.  Please advise Pt contact [phone]

## 2016-12-21 NOTE — Telephone Encounter (Signed)
Given that there is redness and swelling and persistent, needs to be evaluated.  Since I am gone part of this week, would recommend either acute visit here or acute care.

## 2016-12-23 ENCOUNTER — Ambulatory Visit (INDEPENDENT_AMBULATORY_CARE_PROVIDER_SITE_OTHER): Payer: PPO | Admitting: Family

## 2016-12-23 ENCOUNTER — Encounter: Payer: Self-pay | Admitting: Family

## 2016-12-23 VITALS — BP 138/90 | HR 82 | Temp 97.7°F | Ht 62.0 in | Wt 147.4 lb

## 2016-12-23 DIAGNOSIS — S80811A Abrasion, right lower leg, initial encounter: Secondary | ICD-10-CM

## 2016-12-23 DIAGNOSIS — L089 Local infection of the skin and subcutaneous tissue, unspecified: Secondary | ICD-10-CM

## 2016-12-23 MED ORDER — CEPHALEXIN 500 MG PO CAPS: 500.0000 mg | ORAL_CAPSULE | Freq: Four times a day (QID) | ORAL | 0 refills | Status: DC

## 2016-12-23 NOTE — Patient Instructions (Signed)
Let's treat for skin infection  Ensure to take probiotics while on antibiotics and also for 2 weeks after completion. It is important to re-colonize the gut with good bacteria and also to prevent any diarrheal infections associated with antibiotic use.   Let me know if doesn't resolve and we consult dermatology

## 2016-12-23 NOTE — Progress Notes (Signed)
[name], female    DOB: [birth year], 71 y.o.   MRN: [id number]  CC: Monica Morales is a 71 y.o. female who presents today for an acute visit.    HPI: CC: spot under right knee, x 2 months, worsening. describes as tender to touch  Thinks maybe a tick but did remove an insect. 'unsure.' Has been picking at it. [place]   NO drainage, N, V, bulls eye rash, arthralgias.   No h/o MRSA, no h/o skin cancer      HISTORY:  Past Medical History:  Diagnosis Date  . Chronic bronchitis ([place])   . Hypercholesterolemia   . Migraines    Past Surgical History:  Procedure Laterality Date  . ABDOMINAL HYSTERECTOMY  1979   secondary to fibroids, incidental appendectomy  . BREAST BIOPSY  1979   benign  . TONSILLECTOMY  1960   Family History  Problem Relation Age of Onset  . Heart disease Father        myocardial infarction - died age 65  . Heart disease Mother   . Diabetes Mother   . Diabetes Maternal Grandmother   . Breast cancer Neg Hx   . Colon cancer Neg Hx     Allergies: No known drug allergy Current Outpatient Prescriptions on File Prior to Visit  Medication Sig Dispense Refill  . atorvastatin (LIPITOR) 20 MG tablet TAKE ONE (1) TABLET BY MOUTH EVERY DAY 90 tablet 1   No current facility-administered medications on file prior to visit.     Social History  Substance Use Topics  . Smoking status: Current Every Day Smoker    Packs/day: 1.00  . Smokeless tobacco: Never Used     Comment: is going to try electronic cigarettes  . Alcohol use No    Review of Systems  Constitutional: Negative for chills and fever.  Respiratory: Negative for cough.   Cardiovascular: Negative for chest pain and palpitations.  Gastrointestinal: Negative for nausea and vomiting.  Musculoskeletal: Negative for arthralgias.  Skin: Positive for rash.      Objective:    BP 138/90   Pulse 82   Temp 97.7 F (36.5 C)  (Oral)   Ht 5\' 2"  (1.575 m)   Wt 147 lb 6.4 oz (66.9 kg)   SpO2 99%   BMI 26.96 kg/m    Physical Exam  Constitutional: She appears well-developed and well-nourished.  Eyes: Conjunctivae are normal.  Cardiovascular: Normal rate, regular rhythm, normal heart sounds and normal pulses.   Pulmonary/Chest: Effort normal and breath sounds normal. She has no wheezes. She has no rhonchi. She has no rales.  Neurological: She is alert.  Skin: Skin is warm and dry. Rash noted. Rash is nodular.     Tender 2 cm nodule right lower extremity as noted on diagram. No discharge, surrounding erythema. No bull's-eye rash.  Psychiatric: She has a normal mood and affect. Her speech is normal and behavior is normal. Thought content normal.  Vitals reviewed.      Assessment & Plan:   1. Infected abrasion of right lower extremity, initial encounter Afebrile. Patient is well appearing. Localized infection, I suspect from possibly an insect bite [place]. No history of MRSA. We'll cover with Keflex. Discussed with patient if does not improve, will consult dermatology - cephALEXin (KEFLEX) 500 MG capsule; Take 1 capsule (500 mg total) by mouth every 6 (six) hours.  Dispense: 28 capsule; Refill: 0    I am having Ms. Mcclean maintain her atorvastatin.   No orders of the defined types were placed in this encounter.   Return precautions given.   Risks, benefits, and alternatives of the medications and treatment plan prescribed today were discussed, and patient expressed understanding.   Education regarding symptom management and diagnosis given to patient on AVS.  Continue to follow with Einar Pheasant, MD for routine health maintenance.   Monica Morales and I agreed with plan.   Mable Paris, FNP

## 2016-12-23 NOTE — Progress Notes (Signed)
Pre visit review using our clinic review tool, if applicable. No additional management support is needed unless otherwise documented below in the visit note. 

## 2017-01-20 ENCOUNTER — Ambulatory Visit (INDEPENDENT_AMBULATORY_CARE_PROVIDER_SITE_OTHER): Payer: PPO | Admitting: Internal Medicine

## 2017-01-20 ENCOUNTER — Encounter: Payer: Self-pay | Admitting: Internal Medicine

## 2017-01-20 VITALS — BP 110/80 | HR 74 | Temp 98.6°F | Resp 12 | Ht 62.0 in | Wt 148.2 lb

## 2017-01-20 DIAGNOSIS — E7800 Pure hypercholesterolemia, unspecified: Secondary | ICD-10-CM | POA: Diagnosis not present

## 2017-01-20 DIAGNOSIS — Z720 Tobacco use: Secondary | ICD-10-CM

## 2017-01-20 DIAGNOSIS — L989 Disorder of the skin and subcutaneous tissue, unspecified: Secondary | ICD-10-CM | POA: Diagnosis not present

## 2017-01-20 DIAGNOSIS — D473 Essential (hemorrhagic) thrombocythemia: Secondary | ICD-10-CM

## 2017-01-20 DIAGNOSIS — D75839 Thrombocytosis, unspecified: Secondary | ICD-10-CM

## 2017-01-20 MED ORDER — TRIAMCINOLONE ACETONIDE 0.1 % EX CREA: 1.0000 "application " | TOPICAL_CREAM | Freq: Two times a day (BID) | CUTANEOUS | 0 refills | Status: DC

## 2017-01-20 NOTE — Progress Notes (Signed)
[name], female   DOB: [birth year], 71 y.o.   MRN: [id number]   [name], female    DOB: 05-19-1946, 71 y.o.   MRN: [id number]  HPI  Patient here as a work in with concerns regarding a persistent right leg lesion.  She was seen on 12/23/16 and diagnosed with infected abrasion.  Placed on keflex.  She reports the skin lesion is still present.  Is hard.  Redness extending up or down her leg.  No pain, except for with direct pressure over the raised area.  No fever or chills.  Eating and drinking well.  No nausea or diarrhea.     Past Medical History:  Diagnosis Date  . Chronic bronchitis ([place])   . Hypercholesterolemia   . Migraines    Past Surgical History:  Procedure Laterality Date  . ABDOMINAL HYSTERECTOMY  1979   secondary to fibroids, incidental appendectomy  . BREAST BIOPSY  1979   benign  . TONSILLECTOMY  1960   Family History  Problem Relation Age of Onset  . Heart disease Father        myocardial infarction - died age 26  . Heart disease Mother   . Diabetes Mother   . Diabetes Maternal Grandmother   . Breast cancer Neg Hx   . Colon cancer Neg Hx    Social History   Social History  . Marital status: Married    Spouse name: N/A  . Number of children: 1  . Years of education: N/A   Social History Main Topics  . Smoking status: Current Every Day Smoker    Packs/day: 1.00  . Smokeless tobacco: Never Used     Comment: is going to try electronic cigarettes  . Alcohol use No  . Drug use: No  . Sexual activity: No   Other Topics Concern  . None   Social History Narrative  . None    Outpatient Encounter Prescriptions as of 01/20/2017  Medication Sig  . atorvastatin (LIPITOR) 20 MG tablet TAKE ONE (1) TABLET BY MOUTH EVERY DAY  . triamcinolone cream (KENALOG) 0.1 % Apply 1 application topically 2 (two) times daily. Do not apply to face, breasts or genitalia area  . [DISCONTINUED] cephALEXin (KEFLEX) 500 MG  capsule Take 1 capsule (500 mg total) by mouth every 6 (six) hours.   No facility-administered encounter medications on file as of 01/20/2017.     Review of Systems  Constitutional: Negative for appetite change and fever.  Respiratory: Negative for cough and shortness of breath.   Cardiovascular: Negative for leg swelling.  Gastrointestinal: Negative for diarrhea, nausea and vomiting.  Skin: Negative for color change and rash.       Raised lesion - right lower leg.    Psychiatric/Behavioral: Negative for agitation and dysphoric mood.       Objective:    Physical Exam  Constitutional: She appears well-developed and well-nourished. No distress.  Neck: Neck supple.  Cardiovascular: Normal rate and regular rhythm.   Pulmonary/Chest: Breath sounds normal. No respiratory distress. She has no wheezes.  Abdominal: Soft. Bowel sounds are normal. There is no tenderness.  Musculoskeletal: She exhibits no edema or tenderness.  Lymphadenopathy:    She has no cervical adenopathy.  Skin: No rash noted. No erythema.  Raised lesion - right lower leg.  Tenderness with direct palpation over the raised area.  No erythema extending up or down the leg.      BP 110/80 (  BP Location: Left Arm, Patient Position: Sitting, Cuff Size: Normal)   Pulse 74   Temp 98.6 F (37 C) (Oral)   Resp 12   Ht 5\' 2"  (1.575 m)   Wt 148 lb 3.2 oz (67.2 kg)   SpO2 96%   BMI 27.11 kg/m  Wt Readings from Last 3 Encounters:  01/20/17 148 lb 3.2 oz (67.2 kg)  12/23/16 147 lb 6.4 oz (66.9 kg)  09/13/16 150 lb 3.2 oz (68.1 kg)     Lab Results  Component Value Date   WBC 9.2 09/29/2016   HGB 14.7 09/29/2016   HCT 44.0 09/29/2016   PLT 395 10/25/2016   GLUCOSE 98 09/29/2016   CHOL 202 (H) 09/29/2016   TRIG 239.0 (H) 09/29/2016   HDL 69.20 09/29/2016   LDLDIRECT 81.0 09/29/2016   LDLCALC 78 05/10/2016   ALT 18 09/29/2016   AST 17 09/29/2016   NA 141 09/29/2016   K 4.5 09/29/2016   CL 106 09/29/2016    CREATININE 0.71 09/29/2016   BUN 16 09/29/2016   CO2 28 09/29/2016   TSH 2.37 11/24/2015       Assessment & Plan:   Problem List Items Addressed This Visit    Hypercholesterolemia    On lipitor.  Low cholesterol diet and exercise.  Follow lipid panel and liver function tests.        Thrombocytosis ([facility])    Platelet count 10/25/16 - wnl.        Tobacco abuse    Discussed with her today regarding the need to stop smoking.  She declines to stop.        Other Visit Diagnoses    Leg skin lesion, right    -  Primary   Persistent raised skin lesion.  refer to dermatology.     Relevant Orders   Ambulatory referral to Dermatology   Skin lesion       Persistent raised tender lesion.  She was questioning the possibility of a wart.  Refer to dermatology.         Einar Pheasant, MD

## 2017-01-20 NOTE — Progress Notes (Signed)
Pre-visit discussion using our clinic review tool. No additional management support is needed unless otherwise documented below in the visit note.  

## 2017-01-23 ENCOUNTER — Encounter: Payer: Self-pay | Admitting: Internal Medicine

## 2017-01-23 NOTE — Assessment & Plan Note (Signed)
On lipitor.  Low cholesterol diet and exercise.  Follow lipid panel and liver function tests.   

## 2017-01-23 NOTE — Assessment & Plan Note (Signed)
Discussed with her today regarding the need to stop smoking.  She declines to stop.

## 2017-01-23 NOTE — Assessment & Plan Note (Signed)
Platelet count 10/25/16 - wnl.

## 2017-01-24 ENCOUNTER — Telehealth: Payer: Self-pay

## 2017-01-24 ENCOUNTER — Ambulatory Visit (INDEPENDENT_AMBULATORY_CARE_PROVIDER_SITE_OTHER): Payer: PPO | Admitting: Internal Medicine

## 2017-01-24 ENCOUNTER — Telehealth: Payer: Self-pay | Admitting: Internal Medicine

## 2017-01-24 ENCOUNTER — Encounter: Payer: Self-pay | Admitting: Internal Medicine

## 2017-01-24 VITALS — BP 148/90 | HR 75 | Temp 97.7°F | Resp 16 | Ht 63.0 in | Wt 148.0 lb

## 2017-01-24 DIAGNOSIS — Z716 Tobacco abuse counseling: Secondary | ICD-10-CM | POA: Diagnosis not present

## 2017-01-24 DIAGNOSIS — R310 Gross hematuria: Secondary | ICD-10-CM

## 2017-01-24 DIAGNOSIS — R300 Dysuria: Secondary | ICD-10-CM | POA: Diagnosis not present

## 2017-01-24 LAB — POCT URINALYSIS DIPSTICK
Bilirubin, UA: NEGATIVE
Blood, UA: NEGATIVE
Glucose, UA: NEGATIVE
Ketones, UA: NEGATIVE
LEUKOCYTES UA: NEGATIVE
Nitrite, UA: NEGATIVE
PROTEIN UA: NEGATIVE
Spec Grav, UA: 1.01 (ref 1.010–1.025)
UROBILINOGEN UA: 0.2 U/dL
pH, UA: 6.5 (ref 5.0–8.0)

## 2017-01-24 NOTE — Telephone Encounter (Signed)
Pt called c/o waking up yesterday morning and having some blood in her urine and burning and pressure. Pt states that it got a little better through out the day. Pt states that there is no blood in the urine not just frequent urination and pressure. Please advise, thank you  Called pt @ [phone]

## 2017-01-24 NOTE — Telephone Encounter (Signed)
Patient needs stat referral to Dermatology instead of routine. Patient is here today and stated she hasn't heard from dermatology

## 2017-01-24 NOTE — Telephone Encounter (Signed)
Scheduled this afternoon with Dr. Derrel Nip

## 2017-01-24 NOTE — Patient Instructions (Signed)
Your preliminary urinalysis had no blood or signs of infection.  So I do not want to treat you with antibiotics unless  The additional studies are suggestive of infection  If they are NOT,  I recommend that we get a CT of your kidneys and bladder,  And I will set that up for you if indicated.

## 2017-01-24 NOTE — Telephone Encounter (Signed)
I did not put in as stat.  She has a persistent lesion on her leg.  Needs appt with dermatology as soon as we can get one.  Thanks  See me if questions.

## 2017-01-24 NOTE — Progress Notes (Signed)
Subjective:  [name], female    DOB: Sep 03, 1945  Age: 71 y.o. MRN: [id number]  CC: The primary encounter diagnosis was Dysuria. Diagnoses of Tobacco abuse counseling and Hematuria, gross were also pertinent to this visit.  HPISandra W Morales presents for evaluation of supraubic pressure and gross hematuria that occurred last morning.    Pain  has persisted ,but she has not had any more episodes of hematuria.  No dysuria  .drank cranberry .juice yesterday  But symptoms have persisted .  Smoker.    Husband was  diagnosed with bladder cancer several years ago and presented  with gross hematuria   Finished antibiotics for a skin infection  3 weeks ago   Treated for UTI E Coli in February   Outpatient Medications Prior to Visit  Medication Sig Dispense Refill  . atorvastatin (LIPITOR) 20 MG tablet TAKE ONE (1) TABLET BY MOUTH EVERY DAY 90 tablet 1  . triamcinolone cream (KENALOG) 0.1 % Apply 1 application topically 2 (two) times daily. Do not apply to face, breasts or genitalia area 30 g 0   No facility-administered medications prior to visit.     Review of Systems;  Patient denies headache, fevers, malaise, unintentional weight loss, skin rash, eye pain, sinus congestion and sinus pain, sore throat, dysphagia,  hemoptysis , cough, dyspnea, wheezing, chest pain, palpitations, orthopnea, edema, abdominal pain, nausea, melena, diarrhea, constipation, flank pain, dysuria, hematuria, urinary  Frequency, nocturia, numbness, tingling, seizures,  Focal weakness, Loss of consciousness,  Tremor, insomnia, depression, anxiety, and suicidal ideation.      Objective:  BP (!) 148/90   Pulse 75   Temp 97.7 F (36.5 C) (Oral)   Resp 16   Ht 5\' 3"  (1.6 m)   Wt 148 lb (67.1 kg)   SpO2 98%   BMI 26.22 kg/m   BP Readings from Last 3 Encounters:  01/24/17 (!) 148/90  01/20/17 110/80  12/23/16 138/90    Wt Readings from Last 3 Encounters:  01/24/17 148 lb (67.1 kg)    01/20/17 148 lb 3.2 oz (67.2 kg)  12/23/16 147 lb 6.4 oz (66.9 kg)    General appearance: alert, cooperative and appears stated age Ears: normal TM's and external ear canals both ears Throat: lips, mucosa, and tongue normal; teeth and gums normal Neck: no adenopathy, no carotid bruit, supple, symmetrical, trachea midline and thyroid not enlarged, symmetric, no tenderness/mass/nodules Back: symmetric, no curvature. ROM normal. No CVA tenderness. Lungs: clear to auscultation bilaterally Heart: regular rate and rhythm, S1, S2 normal, no murmur, click, rub or gallop Abdomen: soft, non-tender; bowel sounds normal; no masses,  no organomegaly Pulses: 2+ and symmetric Skin: Skin color, texture, turgor normal. No rashes or lesions Lymph nodes: Cervical, supraclavicular, and axillary nodes normal.  No results found for: HGBA1C  Lab Results  Component Value Date   CREATININE 0.71 09/29/2016   CREATININE 0.74 05/10/2016   CREATININE 0.69 11/24/2015    Lab Results  Component Value Date   WBC 9.2 09/29/2016   HGB 14.7 09/29/2016   HCT 44.0 09/29/2016   PLT 395 10/25/2016   GLUCOSE 98 09/29/2016   CHOL 202 (H) 09/29/2016   TRIG 239.0 (H) 09/29/2016   HDL 69.20 09/29/2016   LDLDIRECT 81.0 09/29/2016   LDLCALC 78 05/10/2016   ALT 18 09/29/2016   AST 17 09/29/2016   NA 141 09/29/2016   K 4.5 09/29/2016   CL 106 09/29/2016   CREATININE 0.71 09/29/2016   BUN 16 09/29/2016  CO2 28 09/29/2016   TSH 2.37 11/24/2015    Assessment & Plan:   Problem List Items Addressed This Visit    Tobacco abuse counseling    Risks of continued tobacco use were discussed. She is not currently interested in tobacco cessation.       Hematuria, gross    Awaiting micro and culture  Determine in the cause is infection.  If not indicative of infection,  Needs CT abd pelvis to rule out stones and masses.        Other Visit Diagnoses    Dysuria    -  Primary   Relevant Orders   POC Urinalysis  Dipstick (Completed)   Urine culture   Microalbumin / creatinine urine ratio     A total of 25 minutes of face to face time was spent with patient more than half of which was spent in counselling about the above mentioned conditions  and coordination of care  I am having Monica Morales maintain her atorvastatin and triamcinolone cream.  No orders of the defined types were placed in this encounter.   There are no discontinued medications.  Follow-up: No Follow-up on file.   Crecencio Mc, MD

## 2017-01-25 DIAGNOSIS — Z716 Tobacco abuse counseling: Secondary | ICD-10-CM | POA: Insufficient documentation

## 2017-01-25 DIAGNOSIS — R310 Gross hematuria: Secondary | ICD-10-CM | POA: Insufficient documentation

## 2017-01-25 NOTE — Assessment & Plan Note (Signed)
Risks of continued tobacco use were discussed. She is not currently interested in tobacco cessation.    

## 2017-01-25 NOTE — Assessment & Plan Note (Signed)
Awaiting micro and culture  Determine in the cause is infection.  If not indicative of infection,  Needs CT abd pelvis to rule out stones and masses.

## 2017-01-26 ENCOUNTER — Telehealth: Payer: Self-pay | Admitting: *Deleted

## 2017-01-26 DIAGNOSIS — R300 Dysuria: Secondary | ICD-10-CM

## 2017-01-26 LAB — MICROALBUMIN / CREATININE URINE RATIO
CREATININE, URINE: 50 mg/dL (ref 20–320)
MICROALB UR: 0.7 mg/dL
MICROALB/CREAT RATIO: 14 ug/mg{creat} (ref <30)

## 2017-01-26 NOTE — Telephone Encounter (Signed)
Great. Thank you very much!

## 2017-01-26 NOTE — Telephone Encounter (Signed)
Placed order for add-on urine micro

## 2017-01-26 NOTE — Telephone Encounter (Signed)
Pt is seeing Dr. Allene Dillon at [facility] on 7/25. Will that work?

## 2017-01-27 ENCOUNTER — Telehealth: Payer: Self-pay | Admitting: Internal Medicine

## 2017-01-27 ENCOUNTER — Other Ambulatory Visit: Payer: Self-pay | Admitting: Internal Medicine

## 2017-01-27 DIAGNOSIS — R310 Gross hematuria: Secondary | ICD-10-CM

## 2017-01-27 DIAGNOSIS — Z79899 Other long term (current) drug therapy: Secondary | ICD-10-CM

## 2017-01-27 LAB — URINE CULTURE: ORGANISM ID, BACTERIA: NO GROWTH

## 2017-01-27 NOTE — Telephone Encounter (Signed)
Dr Garth Schlatter CT need labs for Bun and Creatine pt is scheduled to get CT done on 07/23. Thank you!

## 2017-01-27 NOTE — Telephone Encounter (Signed)
Spoke with pt and was able to get her a lab appt scheduled. Also informed the pt of the appt for the CT scan. Pt is aware of appt date and times. Labs have been ordered.

## 2017-01-27 NOTE — Telephone Encounter (Signed)
The urine was sent to [facility] on Monday night. They are going to credit the micro albumin but unfortunately they are unable to run the urine microscopic because it was not sent in a urinalysis tube (only the container was sent since a urinalysis wasn't ordered at the time).

## 2017-01-27 NOTE — Telephone Encounter (Signed)
Ok. Thank you.

## 2017-02-02 ENCOUNTER — Other Ambulatory Visit (INDEPENDENT_AMBULATORY_CARE_PROVIDER_SITE_OTHER): Payer: PPO

## 2017-02-02 DIAGNOSIS — Z79899 Other long term (current) drug therapy: Secondary | ICD-10-CM | POA: Diagnosis not present

## 2017-02-02 LAB — COMPREHENSIVE METABOLIC PANEL
ALT: 15 U/L (ref 0–35)
AST: 16 U/L (ref 0–37)
Albumin: 4.3 g/dL (ref 3.5–5.2)
Alkaline Phosphatase: 85 U/L (ref 39–117)
BUN: 19 mg/dL (ref 6–23)
CALCIUM: 9.6 mg/dL (ref 8.4–10.5)
CHLORIDE: 106 meq/L (ref 96–112)
CO2: 26 meq/L (ref 19–32)
CREATININE: 0.85 mg/dL (ref 0.40–1.20)
GFR: 69.69 mL/min (ref >60.00)
Glucose, Bld: 97 mg/dL (ref 70–99)
Potassium: 4.4 mEq/L (ref 3.5–5.1)
Sodium: 139 mEq/L (ref 135–145)
Total Bilirubin: 0.5 mg/dL (ref 0.2–1.2)
Total Protein: 6.8 g/dL (ref 6.0–8.3)

## 2017-02-07 ENCOUNTER — Ambulatory Visit
Admission: RE | Admit: 2017-02-07 | Discharge: 2017-02-07 | Disposition: A | Discharge status: Home / Self Care | Payer: PPO | Source: Ambulatory Visit | Attending: Internal Medicine | Admitting: Internal Medicine

## 2017-02-07 DIAGNOSIS — R310 Gross hematuria: Secondary | ICD-10-CM

## 2017-02-08 ENCOUNTER — Ambulatory Visit
Admission: RE | Admit: 2017-02-08 | Discharge: 2017-02-08 | Disposition: A | Discharge status: Home / Self Care | Payer: PPO | Source: Ambulatory Visit | Attending: Internal Medicine | Admitting: Internal Medicine

## 2017-02-08 DIAGNOSIS — K429 Umbilical hernia without obstruction or gangrene: Secondary | ICD-10-CM | POA: Diagnosis not present

## 2017-02-08 DIAGNOSIS — Z90710 Acquired absence of both cervix and uterus: Secondary | ICD-10-CM | POA: Insufficient documentation

## 2017-02-08 DIAGNOSIS — M47816 Spondylosis without myelopathy or radiculopathy, lumbar region: Secondary | ICD-10-CM | POA: Diagnosis not present

## 2017-02-08 DIAGNOSIS — R1110 Vomiting, unspecified: Secondary | ICD-10-CM | POA: Diagnosis not present

## 2017-02-08 DIAGNOSIS — K4090 Unilateral inguinal hernia, without obstruction or gangrene, not specified as recurrent: Secondary | ICD-10-CM | POA: Insufficient documentation

## 2017-02-08 DIAGNOSIS — R310 Gross hematuria: Secondary | ICD-10-CM | POA: Insufficient documentation

## 2017-02-08 DIAGNOSIS — K5750 Diverticulosis of both small and large intestine without perforation or abscess without bleeding: Secondary | ICD-10-CM | POA: Insufficient documentation

## 2017-02-08 DIAGNOSIS — I700 Atherosclerosis of aorta: Secondary | ICD-10-CM | POA: Diagnosis not present

## 2017-02-08 MED ORDER — IOPAMIDOL (ISOVUE-300) INJECTION 61%
100.0000 mL | Freq: Once | INTRAVENOUS | Status: AC | PRN
  Administered 2017-02-08: 100 mL via INTRAVENOUS

## 2017-02-09 DIAGNOSIS — L821 Other seborrheic keratosis: Secondary | ICD-10-CM | POA: Diagnosis not present

## 2017-02-09 DIAGNOSIS — L578 Other skin changes due to chronic exposure to nonionizing radiation: Secondary | ICD-10-CM | POA: Diagnosis not present

## 2017-02-09 DIAGNOSIS — C44722 Squamous cell carcinoma of skin of right lower limb, including hip: Secondary | ICD-10-CM | POA: Diagnosis not present

## 2017-02-14 ENCOUNTER — Other Ambulatory Visit: Payer: Self-pay | Admitting: Internal Medicine

## 2017-03-15 ENCOUNTER — Encounter: Payer: Self-pay | Admitting: Internal Medicine

## 2017-03-15 ENCOUNTER — Ambulatory Visit (INDEPENDENT_AMBULATORY_CARE_PROVIDER_SITE_OTHER): Payer: PPO | Admitting: Internal Medicine

## 2017-03-15 VITALS — BP 146/90 | HR 79 | Temp 97.6°F | Ht 63.0 in | Wt 146.8 lb

## 2017-03-15 DIAGNOSIS — E7800 Pure hypercholesterolemia, unspecified: Secondary | ICD-10-CM

## 2017-03-15 DIAGNOSIS — D473 Essential (hemorrhagic) thrombocythemia: Secondary | ICD-10-CM

## 2017-03-15 DIAGNOSIS — Z23 Encounter for immunization: Secondary | ICD-10-CM | POA: Diagnosis not present

## 2017-03-15 DIAGNOSIS — R310 Gross hematuria: Secondary | ICD-10-CM | POA: Diagnosis not present

## 2017-03-15 DIAGNOSIS — D75839 Thrombocytosis, unspecified: Secondary | ICD-10-CM

## 2017-03-15 DIAGNOSIS — Z720 Tobacco use: Secondary | ICD-10-CM

## 2017-03-15 DIAGNOSIS — R319 Hematuria, unspecified: Secondary | ICD-10-CM

## 2017-03-15 LAB — URINALYSIS, MICROSCOPIC ONLY
RBC / HPF: NONE SEEN (ref 0–?)
WBC UA: NONE SEEN (ref 0–?)

## 2017-03-15 LAB — POCT URINALYSIS DIPSTICK
Bilirubin, UA: NEGATIVE
Blood, UA: NEGATIVE
GLUCOSE UA: NEGATIVE
Ketones, UA: NEGATIVE
NITRITE UA: NEGATIVE
PROTEIN UA: NEGATIVE
Spec Grav, UA: 1.01 (ref 1.010–1.025)
UROBILINOGEN UA: 0.2 U/dL
pH, UA: 5.5 (ref 5.0–8.0)

## 2017-03-15 MED ORDER — TETANUS-DIPHTH-ACELL PERTUSSIS 5-2.5-18.5 LF-MCG/0.5 IM SUSP: 0.5000 mL | Freq: Once | INTRAMUSCULAR | 0 refills | Status: AC

## 2017-03-15 NOTE — Progress Notes (Signed)
[name], female   DOB: 1945-08-16, 71 y.o.   MRN: [id number]   [name], female    DOB: [birth year], 71 y.o.   MRN: [id number]  HPI  Patient here for a scheduled follow up.  She is under increased stress.  Worried about leg lesion. Due to have removed tomorrow. Reports increased stress related to this, but states otherwise she feels she is doing well.  No chest pain.  Breathing stable.  No increased cough or congestion.  No acid reflux.  No abdominal pain.  Bowels moving.  She had an episode of hematuria.  See Dr Lupita Dawn note for details.  Intermittent lower abdominal pain/discomfirt.  No blood now.  No vaginal itching, burning or discharge.  States was sure blood coming from her urine.  No fever.     Past Medical History:  Diagnosis Date  . Chronic bronchitis ([place])   . Hypercholesterolemia   . Migraines    Past Surgical History:  Procedure Laterality Date  . ABDOMINAL HYSTERECTOMY  1979   secondary to fibroids, incidental appendectomy  . BREAST BIOPSY  1979   benign  . TONSILLECTOMY  1960   Family History  Problem Relation Age of Onset  . Heart disease Father        myocardial infarction - died age 48  . Heart disease Mother   . Diabetes Mother   . Diabetes Maternal Grandmother   . Breast cancer Neg Hx   . Colon cancer Neg Hx    Social History   Social History  . Marital status: Married    Spouse name: N/A  . Number of children: 1  . Years of education: N/A   Social History Main Topics  . Smoking status: Current Every Day Smoker    Packs/day: 1.00  . Smokeless tobacco: Never Used     Comment: is going to try electronic cigarettes  . Alcohol use No  . Drug use: No  . Sexual activity: No   Other Topics Concern  . None   Social History Narrative  . None    Outpatient Encounter Prescriptions as of 03/15/2017  Medication Sig  . atorvastatin (LIPITOR) 20 MG tablet TAKE ONE (1) TABLET EACH DAY  . [EXPIRED]  Tdap (BOOSTRIX) 5-2.5-18.5 LF-MCG/0.5 injection Inject 0.5 mLs into the muscle once.  . [DISCONTINUED] triamcinolone cream (KENALOG) 0.1 % Apply 1 application topically 2 (two) times daily. Do not apply to face, breasts or genitalia area   No facility-administered encounter medications on file as of 03/15/2017.     Review of Systems  Constitutional: Negative for appetite change and unexpected weight change.  HENT: Negative for congestion and sinus pressure.   Respiratory: Negative for cough, chest tightness and shortness of breath.   Cardiovascular: Negative for chest pain, palpitations and leg swelling.  Gastrointestinal: Negative for diarrhea, nausea and vomiting.       Some lower abdominal cramping - intermittent.   Genitourinary: Positive for hematuria. Negative for difficulty urinating and dysuria.  Musculoskeletal: Negative for joint swelling and myalgias.  Skin: Negative for color change and rash.  Neurological: Negative for dizziness, light-headedness and headaches.  Psychiatric/Behavioral: Negative for dysphoric mood.       Increased stress as outlined.         Objective:    Physical Exam  Constitutional: She appears well-developed and well-nourished. No distress.  HENT:  Nose: Nose normal.  Mouth/Throat: Oropharynx is clear and moist.  Neck: Neck supple.  No thyromegaly present.  Cardiovascular: Normal rate and regular rhythm.   Pulmonary/Chest: Breath sounds normal. No respiratory distress. She has no wheezes.  Abdominal: Soft. Bowel sounds are normal. There is no tenderness.  Musculoskeletal: She exhibits no edema or tenderness.  Lymphadenopathy:    She has no cervical adenopathy.  Skin: No rash noted. No erythema.  Psychiatric: She has a normal mood and affect. Her behavior is normal.    BP (!) 146/90 (BP Location: Left Arm, Patient Position: Sitting, Cuff Size: Normal)   Pulse 79   Temp 97.6 F (36.4 C) (Oral)   Ht 5\' 3"  (1.6 m)   Wt 146 lb 12.8 oz (66.6 kg)    SpO2 94%   BMI 26.00 kg/m  Wt Readings from Last 3 Encounters:  03/15/17 146 lb 12.8 oz (66.6 kg)  01/24/17 148 lb (67.1 kg)  01/20/17 148 lb 3.2 oz (67.2 kg)     Lab Results  Component Value Date   WBC 9.2 09/29/2016   HGB 14.7 09/29/2016   HCT 44.0 09/29/2016   PLT 395 10/25/2016   GLUCOSE 97 02/02/2017   CHOL 202 (H) 09/29/2016   TRIG 239.0 (H) 09/29/2016   HDL 69.20 09/29/2016   LDLDIRECT 81.0 09/29/2016   LDLCALC 78 05/10/2016   ALT 15 02/02/2017   AST 16 02/02/2017   NA 139 02/02/2017   K 4.4 02/02/2017   CL 106 02/02/2017   CREATININE 0.85 02/02/2017   BUN 19 02/02/2017   CO2 26 02/02/2017   TSH 2.37 11/24/2015   MICROALBUR 0.7 01/24/2017    Ct Abdomen Pelvis W Contrast  Result Date: 02/08/2017 CLINICAL DATA:  Patient had blood in urine one time three weeks ago. No pain, nausea or vomiting. Hx of hysterectomy and incidental appendectomy. EXAM: CT ABDOMEN AND PELVIS WITH CONTRAST TECHNIQUE: Multidetector CT imaging of the abdomen and pelvis was performed using the standard protocol following bolus administration of intravenous contrast. CONTRAST:  159mL ISOVUE-300 IOPAMIDOL (ISOVUE-300) INJECTION 61% COMPARISON:  Ultrasound the abdomen 10/07/2004 FINDINGS: Lower chest: No acute abnormality. Hepatobiliary: Focal area of scarring is identified in the posterior segment of the right hepatic lobe. No suspicious liver lesions are identified. The gallbladder is present. Pancreas: Unremarkable. No pancreatic ductal dilatation or surrounding inflammatory changes. Spleen: Normal in size without focal abnormality. Adrenals/Urinary Tract: Adrenal glands are normal in appearance. No hydronephrosis or suspicious renal mass. Urinary bladder is unremarkable in appearance and is decompressed. Stomach/Bowel: The stomach has a normal appearance. Small duodenal diverticulum is present. Small bowel loops are normal in appearance. There is significant colonic diverticulosis, particularly  involving the sigmoid colon. No associated inflammatory changes to indicate diverticulitis. Although in close proximity to the urinary bladder, there is no evidence for direct involvement of the urinary bladder by the sigmoid colonic diverticulosis. No evidence for fistula or abscess. Status post appendectomy. Vascular/Lymphatic: There is atherosclerotic calcification of the abdominal aorta without aneurysm. No retroperitoneal or mesenteric adenopathy. There is normal vascular opacification of the celiac axis, superior mesenteric artery, and inferior mesenteric artery. Normal appearance of the portal venous system and inferior vena cava. Reproductive: Status post hysterectomy.  No adnexal mass. Other: There is a fat containing right inguinal hernia. Small fat containing umbilical hernia. No free pelvic fluid. Musculoskeletal: There are moderate degenerative changes in the lumbar spine, particularly evident at L1-2 where there is disc height loss and L4-5 weight is disc height loss and vacuum disc phenomenon. No evidence for spondylolisthesis. No suspicious lytic or blastic lesions are identified. IMPRESSION:  1. The significant colonic diverticulosis without evidence for acute diverticulitis. 2. No evidence for urinary tract obstruction or infection. No evidence for involvement of the urinary bladder by sigmoid diverticulosis or diverticulitis. 3. Small fat containing right inguinal hernia and umbilical hernia. 4. Small duodenum diverticulum. 5. Status post appendectomy, hysterectomy. 6.  Aortic atherosclerosis. 7. Lumbar spondylosis. Electronically Signed   By: Nolon Nations M.D.   On: 02/08/2017 13:59       Assessment & Plan:   Problem List Items Addressed This Visit    Hematuria, gross    Previously noticed hematuria.  Some intermittent lower abdominal pain/discomfort.  Discussed with her today. Recheck urinalysis.  CT reviewed as outlined.  No clear explanation.  Will have urology evaluate with  question of need for any further testing.        Relevant Orders   Ambulatory referral to Urology   Hypercholesterolemia    On lipitor.  Low cholesterol diet and exercise.  Follow lipid panel and liver function tests.        Relevant Orders   Hepatic function panel   Lipid panel   TSH   Thrombocytosis ([facility])    Recheck cbc to confirm stable.        Relevant Orders   CBC with Differential/Platelet   Tobacco abuse    Continues to smoke.  Discussed the need to quit.  She declines.  Have discussed screening CT chest.  She declines.  Follow.         Other Visit Diagnoses    Need for Tdap vaccination    -  Primary   Hematuria, unspecified type       Relevant Orders   POCT Urinalysis Dipstick (Completed)   Urine Microscopic Only (Completed)   Basic metabolic panel       Einar Pheasant, MD

## 2017-03-15 NOTE — Progress Notes (Signed)
Pre-visit discussion using our clinic review tool. No additional management support is needed unless otherwise documented below in the visit note.  

## 2017-03-16 ENCOUNTER — Encounter: Payer: Self-pay | Admitting: Internal Medicine

## 2017-03-16 DIAGNOSIS — C44722 Squamous cell carcinoma of skin of right lower limb, including hip: Secondary | ICD-10-CM | POA: Diagnosis not present

## 2017-03-16 DIAGNOSIS — L814 Other melanin hyperpigmentation: Secondary | ICD-10-CM | POA: Diagnosis not present

## 2017-03-16 DIAGNOSIS — L578 Other skin changes due to chronic exposure to nonionizing radiation: Secondary | ICD-10-CM | POA: Diagnosis not present

## 2017-03-16 NOTE — Assessment & Plan Note (Signed)
Continues to smoke.  Discussed the need to quit.  She declines.  Have discussed screening CT chest.  She declines.  Follow.

## 2017-03-16 NOTE — Assessment & Plan Note (Signed)
Previously noticed hematuria.  Some intermittent lower abdominal pain/discomfort.  Discussed with her today. Recheck urinalysis.  CT reviewed as outlined.  No clear explanation.  Will have urology evaluate with question of need for any further testing.

## 2017-03-16 NOTE — Assessment & Plan Note (Signed)
Recheck cbc to confirm stable.  

## 2017-03-16 NOTE — Assessment & Plan Note (Signed)
On lipitor.  Low cholesterol diet and exercise.  Follow lipid panel and liver function tests.   

## 2017-04-25 ENCOUNTER — Other Ambulatory Visit (INDEPENDENT_AMBULATORY_CARE_PROVIDER_SITE_OTHER): Payer: PPO

## 2017-04-25 DIAGNOSIS — D75839 Thrombocytosis, unspecified: Secondary | ICD-10-CM

## 2017-04-25 DIAGNOSIS — E7800 Pure hypercholesterolemia, unspecified: Secondary | ICD-10-CM | POA: Diagnosis not present

## 2017-04-25 DIAGNOSIS — D473 Essential (hemorrhagic) thrombocythemia: Secondary | ICD-10-CM

## 2017-04-25 DIAGNOSIS — R319 Hematuria, unspecified: Secondary | ICD-10-CM

## 2017-04-25 LAB — LIPID PANEL
CHOL/HDL RATIO: 3
Cholesterol: 181 mg/dL (ref 0–200)
HDL: 64.1 mg/dL (ref >39.00)
LDL Cholesterol: 80 mg/dL (ref 0–99)
NONHDL: 117
Triglycerides: 187 mg/dL — ABNORMAL HIGH (ref 0.0–149.0)
VLDL: 37.4 mg/dL (ref 0.0–40.0)

## 2017-04-25 LAB — CBC WITH DIFFERENTIAL/PLATELET
BASOS PCT: 0.7 % (ref 0.0–3.0)
Basophils Absolute: 0.1 10*3/uL (ref 0.0–0.1)
EOS ABS: 0.3 10*3/uL (ref 0.0–0.7)
EOS PCT: 3.5 % (ref 0.0–5.0)
HCT: 40.7 % (ref 36.0–46.0)
Hemoglobin: 13.7 g/dL (ref 12.0–15.0)
LYMPHS ABS: 2.7 10*3/uL (ref 0.7–4.0)
Lymphocytes Relative: 32.7 % (ref 12.0–46.0)
MCHC: 33.6 g/dL (ref 30.0–36.0)
MCV: 95 fl (ref 78.0–100.0)
MONO ABS: 0.6 10*3/uL (ref 0.1–1.0)
Monocytes Relative: 7.4 % (ref 3.0–12.0)
NEUTROS PCT: 55.7 % (ref 43.0–77.0)
Neutro Abs: 4.6 10*3/uL (ref 1.4–7.7)
Platelets: 424 10*3/uL — ABNORMAL HIGH (ref 150.0–400.0)
RBC: 4.29 Mil/uL (ref 3.87–5.11)
RDW: 13.3 % (ref 11.5–15.5)
WBC: 8.2 10*3/uL (ref 4.0–10.5)

## 2017-04-25 LAB — HEPATIC FUNCTION PANEL
ALK PHOS: 83 U/L (ref 39–117)
ALT: 15 U/L (ref 0–35)
AST: 14 U/L (ref 0–37)
Albumin: 4.5 g/dL (ref 3.5–5.2)
BILIRUBIN DIRECT: 0.1 mg/dL (ref 0.0–0.3)
TOTAL PROTEIN: 7.3 g/dL (ref 6.0–8.3)
Total Bilirubin: 0.5 mg/dL (ref 0.2–1.2)

## 2017-04-25 LAB — TSH: TSH: 2.91 u[IU]/mL (ref 0.35–4.50)

## 2017-04-25 LAB — BASIC METABOLIC PANEL
BUN: 12 mg/dL (ref 6–23)
CO2: 27 mEq/L (ref 19–32)
Calcium: 9.8 mg/dL (ref 8.4–10.5)
Chloride: 105 mEq/L (ref 96–112)
Creatinine, Ser: 0.68 mg/dL (ref 0.40–1.20)
GFR: 90.1 mL/min (ref >60.00)
Glucose, Bld: 93 mg/dL (ref 70–99)
POTASSIUM: 4.5 meq/L (ref 3.5–5.1)
SODIUM: 139 meq/L (ref 135–145)

## 2017-04-27 ENCOUNTER — Encounter: Payer: Self-pay | Admitting: Internal Medicine

## 2017-04-27 ENCOUNTER — Ambulatory Visit (INDEPENDENT_AMBULATORY_CARE_PROVIDER_SITE_OTHER): Payer: PPO | Admitting: Internal Medicine

## 2017-04-27 DIAGNOSIS — Z716 Tobacco abuse counseling: Secondary | ICD-10-CM

## 2017-04-27 DIAGNOSIS — R030 Elevated blood-pressure reading, without diagnosis of hypertension: Secondary | ICD-10-CM | POA: Diagnosis not present

## 2017-04-27 DIAGNOSIS — F439 Reaction to severe stress, unspecified: Secondary | ICD-10-CM | POA: Diagnosis not present

## 2017-04-27 DIAGNOSIS — E7800 Pure hypercholesterolemia, unspecified: Secondary | ICD-10-CM | POA: Diagnosis not present

## 2017-04-27 DIAGNOSIS — D473 Essential (hemorrhagic) thrombocythemia: Secondary | ICD-10-CM | POA: Diagnosis not present

## 2017-04-27 DIAGNOSIS — R310 Gross hematuria: Secondary | ICD-10-CM | POA: Diagnosis not present

## 2017-04-27 DIAGNOSIS — D75839 Thrombocytosis, unspecified: Secondary | ICD-10-CM

## 2017-04-27 MED ORDER — ATORVASTATIN CALCIUM 20 MG PO TABS: ORAL_TABLET | ORAL | 1 refills | Status: DC

## 2017-04-27 NOTE — Progress Notes (Signed)
[name], female   DOB: [birth year], 71 y.o.   MRN: [id number]   [name], female    DOB: 08/24/1945, 71 y.o.   MRN: [id number]  HPI  Patient here for a scheduled follow up.  She reports she is doing relatively well.  Increased stress with some family issues (with her grandson).  Discussed with her today.  She does not feel she needs anything more at this time.  Seeing dermatology for leg lesion.  Had removed.  Has f/u planned 05/17/17.  Had episode of hematuria.  No further episodes.  CT as outlined.  Has appt with urology next week.  No chest pain.  Breathing stable.  No abdominal pain.  Bowels moving.  Discussed the need to quit smoking.     Past Medical History:  Diagnosis Date  . Chronic bronchitis ([place])   . Hypercholesterolemia   . Migraines    Past Surgical History:  Procedure Laterality Date  . ABDOMINAL HYSTERECTOMY  1979   secondary to fibroids, incidental appendectomy  . BREAST BIOPSY  1979   benign  . TONSILLECTOMY  1960   Family History  Problem Relation Age of Onset  . Heart disease Father        myocardial infarction - died age 81  . Heart disease Mother   . Diabetes Mother   . Diabetes Maternal Grandmother   . Breast cancer Neg Hx   . Colon cancer Neg Hx    Social History   Social History  . Marital status: Married    Spouse name: N/A  . Number of children: 1  . Years of education: N/A   Social History Main Topics  . Smoking status: Current Every Day Smoker    Packs/day: 1.00  . Smokeless tobacco: Never Used     Comment: is going to try electronic cigarettes  . Alcohol use No  . Drug use: No  . Sexual activity: No   Other Topics Concern  . None   Social History Narrative  . None    Outpatient Encounter Prescriptions as of 04/27/2017  Medication Sig  . atorvastatin (LIPITOR) 20 MG tablet TAKE ONE (1) TABLET EACH DAY  . [DISCONTINUED] atorvastatin (LIPITOR) 20 MG tablet TAKE ONE (1) TABLET  EACH DAY   No facility-administered encounter medications on file as of 04/27/2017.     Review of Systems  Constitutional: Negative for appetite change and unexpected weight change.  HENT: Negative for congestion and sinus pressure.   Respiratory: Negative for cough, chest tightness and shortness of breath.   Cardiovascular: Negative for chest pain, palpitations and leg swelling.  Gastrointestinal: Negative for abdominal pain, diarrhea, nausea and vomiting.  Genitourinary: Negative for difficulty urinating and dysuria.  Musculoskeletal: Negative for joint swelling and myalgias.  Skin: Negative for color change and rash.       Right lower leg lesion - healing.   Neurological: Negative for dizziness and headaches.  Psychiatric/Behavioral: Negative for dysphoric mood.       Increased stress as outlined.         Objective:    Physical Exam  Constitutional: She appears well-developed and well-nourished. No distress.  HENT:  Nose: Nose normal.  Mouth/Throat: Oropharynx is clear and moist.  Neck: Neck supple. No thyromegaly present.  Cardiovascular: Normal rate and regular rhythm.   Pulmonary/Chest: Breath sounds normal. No respiratory distress. She has no wheezes.  Abdominal: Soft. Bowel sounds are normal. There is no tenderness.  Musculoskeletal: She exhibits no edema or tenderness.  Lymphadenopathy:    She has no cervical adenopathy.  Skin: No rash noted. No erythema.  Psychiatric: She has a normal mood and affect. Her behavior is normal.    BP (!) 144/88 (BP Location: Left Arm, Patient Position: Sitting, Cuff Size: Normal)   Pulse 73   Temp 98.6 F (37 C) (Oral)   Resp 12   Ht 5\' 3"  (1.6 m)   Wt 148 lb 3.2 oz (67.2 kg)   SpO2 98%   BMI 26.25 kg/m  Wt Readings from Last 3 Encounters:  04/27/17 148 lb 3.2 oz (67.2 kg)  03/15/17 146 lb 12.8 oz (66.6 kg)  01/24/17 148 lb (67.1 kg)     Lab Results  Component Value Date   WBC 8.2 04/25/2017   HGB 13.7 04/25/2017    HCT 40.7 04/25/2017   PLT 424.0 (H) 04/25/2017   GLUCOSE 93 04/25/2017   CHOL 181 04/25/2017   TRIG 187.0 (H) 04/25/2017   HDL 64.10 04/25/2017   LDLDIRECT 81.0 09/29/2016   LDLCALC 80 04/25/2017   ALT 15 04/25/2017   AST 14 04/25/2017   NA 139 04/25/2017   K 4.5 04/25/2017   CL 105 04/25/2017   CREATININE 0.68 04/25/2017   BUN 12 04/25/2017   CO2 27 04/25/2017   TSH 2.91 04/25/2017   MICROALBUR 0.7 01/24/2017    Ct Abdomen Pelvis W Contrast  Result Date: 02/08/2017 CLINICAL DATA:  Patient had blood in urine one time three weeks ago. No pain, nausea or vomiting. Hx of hysterectomy and incidental appendectomy. EXAM: CT ABDOMEN AND PELVIS WITH CONTRAST TECHNIQUE: Multidetector CT imaging of the abdomen and pelvis was performed using the standard protocol following bolus administration of intravenous contrast. CONTRAST:  124mL ISOVUE-300 IOPAMIDOL (ISOVUE-300) INJECTION 61% COMPARISON:  Ultrasound the abdomen 10/07/2004 FINDINGS: Lower chest: No acute abnormality. Hepatobiliary: Focal area of scarring is identified in the posterior segment of the right hepatic lobe. No suspicious liver lesions are identified. The gallbladder is present. Pancreas: Unremarkable. No pancreatic ductal dilatation or surrounding inflammatory changes. Spleen: Normal in size without focal abnormality. Adrenals/Urinary Tract: Adrenal glands are normal in appearance. No hydronephrosis or suspicious renal mass. Urinary bladder is unremarkable in appearance and is decompressed. Stomach/Bowel: The stomach has a normal appearance. Small duodenal diverticulum is present. Small bowel loops are normal in appearance. There is significant colonic diverticulosis, particularly involving the sigmoid colon. No associated inflammatory changes to indicate diverticulitis. Although in close proximity to the urinary bladder, there is no evidence for direct involvement of the urinary bladder by the sigmoid colonic diverticulosis. No evidence  for fistula or abscess. Status post appendectomy. Vascular/Lymphatic: There is atherosclerotic calcification of the abdominal aorta without aneurysm. No retroperitoneal or mesenteric adenopathy. There is normal vascular opacification of the celiac axis, superior mesenteric artery, and inferior mesenteric artery. Normal appearance of the portal venous system and inferior vena cava. Reproductive: Status post hysterectomy.  No adnexal mass. Other: There is a fat containing right inguinal hernia. Small fat containing umbilical hernia. No free pelvic fluid. Musculoskeletal: There are moderate degenerative changes in the lumbar spine, particularly evident at L1-2 where there is disc height loss and L4-5 weight is disc height loss and vacuum disc phenomenon. No evidence for spondylolisthesis. No suspicious lytic or blastic lesions are identified. IMPRESSION: 1. The significant colonic diverticulosis without evidence for acute diverticulitis. 2. No evidence for urinary tract obstruction or infection. No evidence for involvement of the urinary bladder by sigmoid diverticulosis or  diverticulitis. 3. Small fat containing right inguinal hernia and umbilical hernia. 4. Small duodenum diverticulum. 5. Status post appendectomy, hysterectomy. 6.  Aortic atherosclerosis. 7. Lumbar spondylosis. Electronically Signed   By: Nolon Nations M.D.   On: 02/08/2017 13:59       Assessment & Plan:   Problem List Items Addressed This Visit    Elevated blood pressure reading    Recheck improved.  Follow pressures.        Hematuria, gross    Previously noticed hematuria.  CT as outlined.  Last urinalysis ok.  Given her smoking history and given notice previously of hematuria, was scheduled to f/u with urology.  Has appt with Dr Jacqlyn Larsen next week.        Hypercholesterolemia    On lipitor.  Low cholesterol diet and exercise.  Follow lipid panel and liver function tests.        Relevant Medications   atorvastatin (LIPITOR) 20 MG  tablet   Stress    Increased stress as outlined.  Discussed with her today.  She feels overall she is doing relatively well.  Desires no further intervention at this time.  Follow.        Thrombocytosis ([place])    Recheck cbc to confirm stable.       Tobacco abuse counseling    Discussed the need to quit smoking.  Also discussed CT screening.  She declines.            Einar Pheasant, MD

## 2017-04-30 ENCOUNTER — Encounter: Payer: Self-pay | Admitting: Internal Medicine

## 2017-04-30 DIAGNOSIS — F439 Reaction to severe stress, unspecified: Secondary | ICD-10-CM | POA: Insufficient documentation

## 2017-04-30 NOTE — Assessment & Plan Note (Signed)
Recheck cbc to confirm stable.  

## 2017-04-30 NOTE — Assessment & Plan Note (Addendum)
Discussed the need to quit smoking.  Also discussed CT screening.  She declines.

## 2017-04-30 NOTE — Assessment & Plan Note (Signed)
On lipitor.  Low cholesterol diet and exercise.  Follow lipid panel and liver function tests.   

## 2017-04-30 NOTE — Assessment & Plan Note (Signed)
Recheck improved.  Follow pressures.   

## 2017-04-30 NOTE — Assessment & Plan Note (Signed)
Previously noticed hematuria.  CT as outlined.  Last urinalysis ok.  Given her smoking history and given notice previously of hematuria, was scheduled to f/u with urology.  Has appt with Dr Jacqlyn Larsen next week.

## 2017-04-30 NOTE — Assessment & Plan Note (Signed)
Increased stress as outlined.  Discussed with her today.  She feels overall she is doing relatively well.  Desires no further intervention at this time.  Follow.

## 2017-05-05 DIAGNOSIS — R339 Retention of urine, unspecified: Secondary | ICD-10-CM | POA: Diagnosis not present

## 2017-05-05 DIAGNOSIS — R310 Gross hematuria: Secondary | ICD-10-CM | POA: Diagnosis not present

## 2017-05-17 DIAGNOSIS — L905 Scar conditions and fibrosis of skin: Secondary | ICD-10-CM | POA: Diagnosis not present

## 2017-05-17 DIAGNOSIS — Z85828 Personal history of other malignant neoplasm of skin: Secondary | ICD-10-CM | POA: Diagnosis not present

## 2017-05-18 DIAGNOSIS — R310 Gross hematuria: Secondary | ICD-10-CM | POA: Diagnosis not present

## 2017-06-15 DIAGNOSIS — Z1231 Encounter for screening mammogram for malignant neoplasm of breast: Secondary | ICD-10-CM | POA: Diagnosis not present

## 2017-06-30 ENCOUNTER — Ambulatory Visit: Payer: PPO | Admitting: Internal Medicine

## 2017-06-30 ENCOUNTER — Encounter: Payer: Self-pay | Admitting: Internal Medicine

## 2017-06-30 VITALS — BP 138/84 | HR 72 | Temp 97.8°F | Wt 149.8 lb

## 2017-06-30 DIAGNOSIS — F439 Reaction to severe stress, unspecified: Secondary | ICD-10-CM

## 2017-06-30 DIAGNOSIS — Z716 Tobacco abuse counseling: Secondary | ICD-10-CM | POA: Diagnosis not present

## 2017-06-30 DIAGNOSIS — R030 Elevated blood-pressure reading, without diagnosis of hypertension: Secondary | ICD-10-CM

## 2017-06-30 DIAGNOSIS — E7800 Pure hypercholesterolemia, unspecified: Secondary | ICD-10-CM

## 2017-06-30 DIAGNOSIS — D473 Essential (hemorrhagic) thrombocythemia: Secondary | ICD-10-CM

## 2017-06-30 DIAGNOSIS — R310 Gross hematuria: Secondary | ICD-10-CM | POA: Diagnosis not present

## 2017-06-30 DIAGNOSIS — D75839 Thrombocytosis, unspecified: Secondary | ICD-10-CM

## 2017-06-30 LAB — PLATELET COUNT: PLATELETS: 403 10*3/uL — AB (ref 140–400)

## 2017-07-01 ENCOUNTER — Encounter: Payer: Self-pay | Admitting: *Deleted

## 2017-07-03 ENCOUNTER — Encounter: Payer: Self-pay | Admitting: Internal Medicine

## 2017-07-03 NOTE — Assessment & Plan Note (Signed)
Recently worked up by Dr Jacqlyn Larsen.  W/up unrevealing.  Has not noticed any further blood in her urine.  Has f/u planned 07/2017.

## 2017-07-03 NOTE — Assessment & Plan Note (Signed)
Recheck improved.  Follow pressures.  Follow metabolic panel.

## 2017-07-03 NOTE — Progress Notes (Signed)
[name], female   DOB: 1946-04-13, 71 y.o.   MRN: [id number]   [name], female    DOB: 1946-05-30, 71 y.o.   MRN: [id number]  HPI  Patient here for a scheduled follow up.  States she is doing relatively well.  Still smoking.  Discussed the need to quit. She declines.  Recently evaluated by Dr Jacqlyn Larsen for hematuria.  W/up unrevealing.  Planned f/u in 07/2017.  No further hematuria.  No abdominal pain.  Bowels moving.  No chest pain.  No sob.  No acid reflux.  Taking her cholesterol medication.  States blood pressure averaging 120s/60s.  Discussed lab results.     Past Medical History:  Diagnosis Date  . Chronic bronchitis ([place])   . Hypercholesterolemia   . Migraines    Past Surgical History:  Procedure Laterality Date  . ABDOMINAL HYSTERECTOMY  1979   secondary to fibroids, incidental appendectomy  . BREAST BIOPSY  1979   benign  . TONSILLECTOMY  1960   Family History  Problem Relation Age of Onset  . Heart disease Father        myocardial infarction - died age 83  . Heart disease Mother   . Diabetes Mother   . Diabetes Maternal Grandmother   . Breast cancer Neg Hx   . Colon cancer Neg Hx    Social History   Socioeconomic History  . Marital status: Married    Spouse name: None  . Number of children: 1  . Years of education: None  . Highest education level: None  Social Needs  . Financial resource strain: None  . Food insecurity - worry: None  . Food insecurity - inability: None  . Transportation needs - medical: None  . Transportation needs - non-medical: None  Occupational History  . None  Tobacco Use  . Smoking status: Current Every Day Smoker    Packs/day: 1.00  . Smokeless tobacco: Never Used  . Tobacco comment: is going to try electronic cigarettes  Substance and Sexual Activity  . Alcohol use: No    Alcohol/week: 0.0 oz  . Drug use: No  . Sexual activity: No  Other Topics Concern  . None  Social  History Narrative  . None    Outpatient Encounter Medications as of 06/30/2017  Medication Sig  . atorvastatin (LIPITOR) 20 MG tablet TAKE ONE (1) TABLET EACH DAY   No facility-administered encounter medications on file as of 06/30/2017.     Review of Systems  Constitutional: Negative for appetite change and unexpected weight change.  HENT: Negative for congestion and sinus pressure.   Respiratory: Negative for cough, chest tightness and shortness of breath.   Cardiovascular: Negative for chest pain, palpitations and leg swelling.  Gastrointestinal: Negative for abdominal pain, diarrhea, nausea and vomiting.  Genitourinary: Negative for difficulty urinating and dysuria.  Musculoskeletal: Negative for joint swelling and myalgias.  Skin: Negative for color change and rash.  Neurological: Negative for dizziness, light-headedness and headaches.  Psychiatric/Behavioral: Negative for agitation and dysphoric mood.       Objective:    Physical Exam  Constitutional: She appears well-developed and well-nourished. No distress.  HENT:  Nose: Nose normal.  Mouth/Throat: Oropharynx is clear and moist.  Neck: Neck supple. No thyromegaly present.  Cardiovascular: Normal rate and regular rhythm.  Pulmonary/Chest: Breath sounds normal. No respiratory distress. She has no wheezes.  Abdominal: Soft. Bowel sounds are normal. There is no tenderness.  Musculoskeletal: She  exhibits no edema or tenderness.  Lymphadenopathy:    She has no cervical adenopathy.  Skin: No rash noted. No erythema.  Psychiatric: She has a normal mood and affect. Her behavior is normal.    BP 138/84   Pulse 72   Temp 97.8 F (36.6 C) (Oral)   Wt 149 lb 12.8 oz (67.9 kg)   BMI 26.54 kg/m  Wt Readings from Last 3 Encounters:  06/30/17 149 lb 12.8 oz (67.9 kg)  04/27/17 148 lb 3.2 oz (67.2 kg)  03/15/17 146 lb 12.8 oz (66.6 kg)     Lab Results  Component Value Date   WBC 8.2 04/25/2017   HGB 13.7 04/25/2017     HCT 40.7 04/25/2017   PLT 403 (H) 06/30/2017   GLUCOSE 93 04/25/2017   CHOL 181 04/25/2017   TRIG 187.0 (H) 04/25/2017   HDL 64.10 04/25/2017   LDLDIRECT 81.0 09/29/2016   LDLCALC 80 04/25/2017   ALT 15 04/25/2017   AST 14 04/25/2017   NA 139 04/25/2017   K 4.5 04/25/2017   CL 105 04/25/2017   CREATININE 0.68 04/25/2017   BUN 12 04/25/2017   CO2 27 04/25/2017   TSH 2.91 04/25/2017   MICROALBUR 0.7 01/24/2017    Ct Abdomen Pelvis W Contrast  Result Date: 02/08/2017 CLINICAL DATA:  Patient had blood in urine one time three weeks ago. No pain, nausea or vomiting. Hx of hysterectomy and incidental appendectomy. EXAM: CT ABDOMEN AND PELVIS WITH CONTRAST TECHNIQUE: Multidetector CT imaging of the abdomen and pelvis was performed using the standard protocol following bolus administration of intravenous contrast. CONTRAST:  186mL ISOVUE-300 IOPAMIDOL (ISOVUE-300) INJECTION 61% COMPARISON:  Ultrasound the abdomen 10/07/2004 FINDINGS: Lower chest: No acute abnormality. Hepatobiliary: Focal area of scarring is identified in the posterior segment of the right hepatic lobe. No suspicious liver lesions are identified. The gallbladder is present. Pancreas: Unremarkable. No pancreatic ductal dilatation or surrounding inflammatory changes. Spleen: Normal in size without focal abnormality. Adrenals/Urinary Tract: Adrenal glands are normal in appearance. No hydronephrosis or suspicious renal mass. Urinary bladder is unremarkable in appearance and is decompressed. Stomach/Bowel: The stomach has a normal appearance. Small duodenal diverticulum is present. Small bowel loops are normal in appearance. There is significant colonic diverticulosis, particularly involving the sigmoid colon. No associated inflammatory changes to indicate diverticulitis. Although in close proximity to the urinary bladder, there is no evidence for direct involvement of the urinary bladder by the sigmoid colonic diverticulosis. No evidence  for fistula or abscess. Status post appendectomy. Vascular/Lymphatic: There is atherosclerotic calcification of the abdominal aorta without aneurysm. No retroperitoneal or mesenteric adenopathy. There is normal vascular opacification of the celiac axis, superior mesenteric artery, and inferior mesenteric artery. Normal appearance of the portal venous system and inferior vena cava. Reproductive: Status post hysterectomy.  No adnexal mass. Other: There is a fat containing right inguinal hernia. Small fat containing umbilical hernia. No free pelvic fluid. Musculoskeletal: There are moderate degenerative changes in the lumbar spine, particularly evident at L1-2 where there is disc height loss and L4-5 weight is disc height loss and vacuum disc phenomenon. No evidence for spondylolisthesis. No suspicious lytic or blastic lesions are identified. IMPRESSION: 1. The significant colonic diverticulosis without evidence for acute diverticulitis. 2. No evidence for urinary tract obstruction or infection. No evidence for involvement of the urinary bladder by sigmoid diverticulosis or diverticulitis. 3. Small fat containing right inguinal hernia and umbilical hernia. 4. Small duodenum diverticulum. 5. Status post appendectomy, hysterectomy. 6.  Aortic atherosclerosis. 7.  Lumbar spondylosis. Electronically Signed   By: Nolon Nations M.D.   On: 02/08/2017 13:59       Assessment & Plan:   Problem List Items Addressed This Visit    Elevated blood pressure reading    Recheck improved.  Follow pressures.  Follow metabolic panel.        Hematuria, gross    Recently worked up by Dr Jacqlyn Larsen.  W/up unrevealing.  Has not noticed any further blood in her urine.  Has f/u planned 07/2017.        Hypercholesterolemia    On lipitor.  Low cholesterol diet and exercise.  Follow lipid panel and liver function tests.        Stress    Discussed with her today.  She feels she is handling things relatively well.  Does not feel needs  anything more.  Follow.        Thrombocytosis ([place]) - Primary   Relevant Orders   Platelet count (Completed)   Tobacco abuse counseling    Discussed the need to quit smoking.  She declines.  Has declined CT screening.            Einar Pheasant, MD

## 2017-07-03 NOTE — Assessment & Plan Note (Signed)
Discussed the need to quit smoking.  She declines.  Has declined CT screening.

## 2017-07-03 NOTE — Assessment & Plan Note (Signed)
On lipitor.  Low cholesterol diet and exercise.  Follow lipid panel and liver function tests.   

## 2017-07-03 NOTE — Assessment & Plan Note (Signed)
Discussed with her today.  She feels she is handling things relatively well.  Does not feel needs anything more.  Follow.

## 2017-08-08 ENCOUNTER — Other Ambulatory Visit: Payer: Self-pay | Admitting: Internal Medicine

## 2017-08-16 DIAGNOSIS — R339 Retention of urine, unspecified: Secondary | ICD-10-CM | POA: Diagnosis not present

## 2017-08-16 DIAGNOSIS — R310 Gross hematuria: Secondary | ICD-10-CM | POA: Diagnosis not present

## 2017-08-31 ENCOUNTER — Ambulatory Visit: Payer: PPO

## 2017-10-10 ENCOUNTER — Encounter: Payer: Self-pay | Admitting: Internal Medicine

## 2017-10-10 ENCOUNTER — Ambulatory Visit (INDEPENDENT_AMBULATORY_CARE_PROVIDER_SITE_OTHER): Payer: PPO | Admitting: Internal Medicine

## 2017-10-10 VITALS — BP 118/78 | HR 80 | Temp 97.7°F | Resp 18 | Ht 63.0 in | Wt 145.8 lb

## 2017-10-10 DIAGNOSIS — B349 Viral infection, unspecified: Secondary | ICD-10-CM | POA: Diagnosis not present

## 2017-10-10 DIAGNOSIS — E2839 Other primary ovarian failure: Secondary | ICD-10-CM | POA: Diagnosis not present

## 2017-10-10 DIAGNOSIS — D75839 Thrombocytosis, unspecified: Secondary | ICD-10-CM

## 2017-10-10 DIAGNOSIS — R310 Gross hematuria: Secondary | ICD-10-CM

## 2017-10-10 DIAGNOSIS — D473 Essential (hemorrhagic) thrombocythemia: Secondary | ICD-10-CM | POA: Diagnosis not present

## 2017-10-10 DIAGNOSIS — F439 Reaction to severe stress, unspecified: Secondary | ICD-10-CM | POA: Diagnosis not present

## 2017-10-10 DIAGNOSIS — Z720 Tobacco use: Secondary | ICD-10-CM

## 2017-10-10 DIAGNOSIS — Z0000 Encounter for general adult medical examination without abnormal findings: Secondary | ICD-10-CM

## 2017-10-10 DIAGNOSIS — E7800 Pure hypercholesterolemia, unspecified: Secondary | ICD-10-CM | POA: Diagnosis not present

## 2017-10-10 NOTE — Progress Notes (Signed)
[name], female   DOB: 1946-02-24, 72 y.o.   MRN: [id number]   [name], female    DOB: 1945-09-13, 72 y.o.   MRN: [id number]  HPI  Patient with past history of hypercholesterolemia and chronic bronchitis.  She comes in today to follow up on these issues as well as for a complete physical exam.  She reports she had been doing well.  Over this past weekend, she developed aching, subjective fever, vomiting and diarrhea.  Her husband was sick with similar symptoms prior to her getting sick.  States she feels better now.  Eating.  No nausea or vomiting now.  No diarrhea now.  No abdominal pain.  Bowels moving.  No chest pain.  No sob.  No acid reflux.  Discussed the need to stop smoking.  She declines.  Discussed screening CT.  She declines.     Past Medical History:  Diagnosis Date  . Chronic bronchitis ([place])   . Hypercholesterolemia   . Migraines    Past Surgical History:  Procedure Laterality Date  . ABDOMINAL HYSTERECTOMY  1979   secondary to fibroids, incidental appendectomy  . BREAST BIOPSY  1979   benign  . TONSILLECTOMY  1960   Family History  Problem Relation Age of Onset  . Heart disease Father        myocardial infarction - died age 69  . Heart disease Mother   . Diabetes Mother   . Diabetes Maternal Grandmother   . Breast cancer Neg Hx   . Colon cancer Neg Hx    Social History   Socioeconomic History  . Marital status: Married    Spouse name: Not on file  . Number of children: 1  . Years of education: Not on file  . Highest education level: Not on file  Occupational History  . Not on file  Social Needs  . Financial resource strain: Not on file  . Food insecurity:    Worry: Not on file    Inability: Not on file  . Transportation needs:    Medical: Not on file    Non-medical: Not on file  Tobacco Use  . Smoking status: Current Every Day Smoker    Packs/day: 1.00  . Smokeless tobacco: Never Used  .  Tobacco comment: is going to try electronic cigarettes  Substance and Sexual Activity  . Alcohol use: No    Alcohol/week: 0.0 oz  . Drug use: No  . Sexual activity: Never  Lifestyle  . Physical activity:    Days per week: Not on file    Minutes per session: Not on file  . Stress: Not on file  Relationships  . Social connections:    Talks on phone: Not on file    Gets together: Not on file    Attends religious service: Not on file    Active member of club or organization: Not on file    Attends meetings of clubs or organizations: Not on file    Relationship status: Not on file  Other Topics Concern  . Not on file  Social History Narrative  . Not on file    Outpatient Encounter Medications as of 10/10/2017  Medication Sig  . atorvastatin (LIPITOR) 20 MG tablet TAKE ONE (1) TABLET BY MOUTH EVERY DAY  . [DISCONTINUED] atorvastatin (LIPITOR) 20 MG tablet TAKE ONE (1) TABLET EACH DAY   No facility-administered encounter medications on file as of 10/10/2017.  Review of Systems  Constitutional: Negative for appetite change and unexpected weight change.  HENT: Negative for congestion and sinus pressure.   Eyes: Negative for pain and visual disturbance.  Respiratory: Negative for cough, chest tightness and shortness of breath.   Cardiovascular: Negative for chest pain, palpitations and leg swelling.  Gastrointestinal: Negative for abdominal pain, diarrhea, nausea and vomiting.       Previous diarrhea and vomiting.  Resolved now.  Appetite improving.    Genitourinary: Negative for difficulty urinating and dysuria.  Musculoskeletal: Negative for joint swelling and myalgias.  Skin: Negative for color change and rash.  Neurological: Negative for dizziness, light-headedness and headaches.  Hematological: Negative for adenopathy. Does not bruise/bleed easily.  Psychiatric/Behavioral: Negative for agitation and dysphoric mood.       Objective:    Physical Exam  Constitutional:  She is oriented to person, place, and time. She appears well-developed and well-nourished. No distress.  HENT:  Nose: Nose normal.  Mouth/Throat: Oropharynx is clear and moist.  Eyes: Right eye exhibits no discharge. Left eye exhibits no discharge. No scleral icterus.  Neck: Neck supple. No thyromegaly present.  Cardiovascular: Normal rate and regular rhythm.  Pulmonary/Chest: Breath sounds normal. No accessory muscle usage. No tachypnea. No respiratory distress. She has no decreased breath sounds. She has no wheezes. She has no rhonchi. Right breast exhibits no inverted nipple, no mass, no nipple discharge and no tenderness (no axillary adenopathy). Left breast exhibits no inverted nipple, no mass, no nipple discharge and no tenderness (no axilarry adenopathy).  Abdominal: Soft. Bowel sounds are normal. There is no tenderness.  Musculoskeletal: She exhibits no edema or tenderness.  Lymphadenopathy:    She has no cervical adenopathy.  Neurological: She is alert and oriented to person, place, and time.  Skin: Skin is warm. No rash noted. No erythema.  Psychiatric: She has a normal mood and affect. Her behavior is normal.    BP 118/78 (BP Location: Left Arm, Patient Position: Sitting, Cuff Size: Normal)   Pulse 80   Temp 97.7 F (36.5 C) (Oral)   Resp 18   Ht 5\' 3"  (1.6 m)   Wt 145 lb 12.8 oz (66.1 kg)   SpO2 97%   BMI 25.83 kg/m  Wt Readings from Last 3 Encounters:  10/10/17 145 lb 12.8 oz (66.1 kg)  06/30/17 149 lb 12.8 oz (67.9 kg)  04/27/17 148 lb 3.2 oz (67.2 kg)     Lab Results  Component Value Date   WBC 8.2 04/25/2017   HGB 13.7 04/25/2017   HCT 40.7 04/25/2017   PLT 403 (H) 06/30/2017   GLUCOSE 93 04/25/2017   CHOL 181 04/25/2017   TRIG 187.0 (H) 04/25/2017   HDL 64.10 04/25/2017   LDLDIRECT 81.0 09/29/2016   LDLCALC 80 04/25/2017   ALT 15 04/25/2017   AST 14 04/25/2017   NA 139 04/25/2017   K 4.5 04/25/2017   CL 105 04/25/2017   CREATININE 0.68 04/25/2017    BUN 12 04/25/2017   CO2 27 04/25/2017   TSH 2.91 04/25/2017   MICROALBUR 0.7 01/24/2017    Ct Abdomen Pelvis W Contrast  Result Date: 02/08/2017 CLINICAL DATA:  Patient had blood in urine one time three weeks ago. No pain, nausea or vomiting. Hx of hysterectomy and incidental appendectomy. EXAM: CT ABDOMEN AND PELVIS WITH CONTRAST TECHNIQUE: Multidetector CT imaging of the abdomen and pelvis was performed using the standard protocol following bolus administration of intravenous contrast. CONTRAST:  116mL ISOVUE-300 IOPAMIDOL (ISOVUE-300) INJECTION 61% COMPARISON:  Ultrasound the abdomen 10/07/2004 FINDINGS: Lower chest: No acute abnormality. Hepatobiliary: Focal area of scarring is identified in the posterior segment of the right hepatic lobe. No suspicious liver lesions are identified. The gallbladder is present. Pancreas: Unremarkable. No pancreatic ductal dilatation or surrounding inflammatory changes. Spleen: Normal in size without focal abnormality. Adrenals/Urinary Tract: Adrenal glands are normal in appearance. No hydronephrosis or suspicious renal mass. Urinary bladder is unremarkable in appearance and is decompressed. Stomach/Bowel: The stomach has a normal appearance. Small duodenal diverticulum is present. Small bowel loops are normal in appearance. There is significant colonic diverticulosis, particularly involving the sigmoid colon. No associated inflammatory changes to indicate diverticulitis. Although in close proximity to the urinary bladder, there is no evidence for direct involvement of the urinary bladder by the sigmoid colonic diverticulosis. No evidence for fistula or abscess. Status post appendectomy. Vascular/Lymphatic: There is atherosclerotic calcification of the abdominal aorta without aneurysm. No retroperitoneal or mesenteric adenopathy. There is normal vascular opacification of the celiac axis, superior mesenteric artery, and inferior mesenteric artery. Normal appearance of the  portal venous system and inferior vena cava. Reproductive: Status post hysterectomy.  No adnexal mass. Other: There is a fat containing right inguinal hernia. Small fat containing umbilical hernia. No free pelvic fluid. Musculoskeletal: There are moderate degenerative changes in the lumbar spine, particularly evident at L1-2 where there is disc height loss and L4-5 weight is disc height loss and vacuum disc phenomenon. No evidence for spondylolisthesis. No suspicious lytic or blastic lesions are identified. IMPRESSION: 1. The significant colonic diverticulosis without evidence for acute diverticulitis. 2. No evidence for urinary tract obstruction or infection. No evidence for involvement of the urinary bladder by sigmoid diverticulosis or diverticulitis. 3. Small fat containing right inguinal hernia and umbilical hernia. 4. Small duodenum diverticulum. 5. Status post appendectomy, hysterectomy. 6.  Aortic atherosclerosis. 7. Lumbar spondylosis. Electronically Signed   By: Nolon Nations M.D.   On: 02/08/2017 13:59       Assessment & Plan:   Problem List Items Addressed This Visit    Health care maintenance    Physical today 10/10/17.  Mammogram 06/15/17 - birads I.  Colonoscopy 12/2012 - diverticulosis.        Hematuria, gross    Saw Dr Jacqlyn Larsen for further w/up.  W/up unrevealing.  No further hematuria.  Last evaluated 07/2017.  Recommended f/u in 9 months.        Hypercholesterolemia    On lipitor.  Low cholesterol diet and exercise.  Follow lipid panel and liver function tests.        Relevant Orders   Hepatic function panel   Lipid panel   Basic metabolic panel   Stress    Overall she feels she is handling things well.  Follow.        Thrombocytosis ([facility])    Platelet count just slightly elevated last check.  Follow cbc.        Relevant Orders   CBC with Differential/Platelet   Tobacco abuse    Discussed the need to quit smoking.  She declines.  Discussed screening CT.  She declines.          Other Visit Diagnoses    Estrogen deficiency    -  Primary   Relevant Orders   DG Bone Density   Viral syndrome       previous vomiting and diarrhea.  resolved now.  feeling better.  eaitng.  check metabolic panel.  follow.        Abe Schools,  MD

## 2017-10-10 NOTE — Assessment & Plan Note (Signed)
Physical today 10/10/17.  Mammogram 06/15/17 - birads I.  Colonoscopy 12/2012 - diverticulosis.

## 2017-10-11 ENCOUNTER — Encounter: Payer: Self-pay | Admitting: Internal Medicine

## 2017-10-11 NOTE — Assessment & Plan Note (Signed)
Saw Dr Jacqlyn Larsen for further w/up.  W/up unrevealing.  No further hematuria.  Last evaluated 07/2017.  Recommended f/u in 9 months.

## 2017-10-11 NOTE — Assessment & Plan Note (Signed)
Discussed the need to quit smoking.  She declines.  Discussed screening CT.  She declines.

## 2017-10-11 NOTE — Assessment & Plan Note (Signed)
Platelet count just slightly elevated last check.  Follow cbc.

## 2017-10-11 NOTE — Assessment & Plan Note (Signed)
On lipitor.  Low cholesterol diet and exercise.  Follow lipid panel and liver function tests.   

## 2017-10-11 NOTE — Assessment & Plan Note (Signed)
Overall she feels she is handling things well.  Follow.   

## 2017-10-25 ENCOUNTER — Ambulatory Visit
Admission: RE | Admit: 2017-10-25 | Discharge: 2017-10-25 | Disposition: A | Discharge status: Home / Self Care | Payer: PPO | Source: Ambulatory Visit | Attending: Internal Medicine | Admitting: Internal Medicine

## 2017-10-25 DIAGNOSIS — M85852 Other specified disorders of bone density and structure, left thigh: Secondary | ICD-10-CM | POA: Diagnosis not present

## 2017-10-25 DIAGNOSIS — Z780 Asymptomatic menopausal state: Secondary | ICD-10-CM | POA: Diagnosis not present

## 2017-10-25 DIAGNOSIS — E2839 Other primary ovarian failure: Secondary | ICD-10-CM | POA: Diagnosis not present

## 2017-10-31 ENCOUNTER — Other Ambulatory Visit (INDEPENDENT_AMBULATORY_CARE_PROVIDER_SITE_OTHER): Payer: PPO

## 2017-10-31 DIAGNOSIS — R300 Dysuria: Secondary | ICD-10-CM

## 2017-10-31 DIAGNOSIS — D75839 Thrombocytosis, unspecified: Secondary | ICD-10-CM

## 2017-10-31 DIAGNOSIS — E7800 Pure hypercholesterolemia, unspecified: Secondary | ICD-10-CM

## 2017-10-31 DIAGNOSIS — D473 Essential (hemorrhagic) thrombocythemia: Secondary | ICD-10-CM

## 2017-10-31 LAB — CBC WITH DIFFERENTIAL/PLATELET
BASOS ABS: 0.1 10*3/uL (ref 0.0–0.1)
Basophils Relative: 0.8 % (ref 0.0–3.0)
Eosinophils Absolute: 0.2 10*3/uL (ref 0.0–0.7)
Eosinophils Relative: 3.6 % (ref 0.0–5.0)
HCT: 40.7 % (ref 36.0–46.0)
Hemoglobin: 13.9 g/dL (ref 12.0–15.0)
LYMPHS ABS: 2.8 10*3/uL (ref 0.7–4.0)
Lymphocytes Relative: 40.5 % (ref 12.0–46.0)
MCHC: 34.2 g/dL (ref 30.0–36.0)
MCV: 92.1 fl (ref 78.0–100.0)
MONO ABS: 0.5 10*3/uL (ref 0.1–1.0)
Monocytes Relative: 8 % (ref 3.0–12.0)
NEUTROS ABS: 3.2 10*3/uL (ref 1.4–7.7)
NEUTROS PCT: 47.1 % (ref 43.0–77.0)
PLATELETS: 497 10*3/uL — AB (ref 150.0–400.0)
RBC: 4.42 Mil/uL (ref 3.87–5.11)
RDW: 13.5 % (ref 11.5–15.5)
WBC: 6.8 10*3/uL (ref 4.0–10.5)

## 2017-10-31 LAB — LIPID PANEL
CHOLESTEROL: 170 mg/dL (ref 0–200)
HDL: 55.3 mg/dL (ref >39.00)
LDL Cholesterol: 78 mg/dL (ref 0–99)
NonHDL: 114.8
TRIGLYCERIDES: 183 mg/dL — AB (ref 0.0–149.0)
Total CHOL/HDL Ratio: 3
VLDL: 36.6 mg/dL (ref 0.0–40.0)

## 2017-10-31 LAB — HEPATIC FUNCTION PANEL
ALBUMIN: 4.3 g/dL (ref 3.5–5.2)
ALK PHOS: 88 U/L (ref 39–117)
ALT: 15 U/L (ref 0–35)
AST: 14 U/L (ref 0–37)
BILIRUBIN DIRECT: 0.1 mg/dL (ref 0.0–0.3)
Total Bilirubin: 0.6 mg/dL (ref 0.2–1.2)
Total Protein: 7.1 g/dL (ref 6.0–8.3)

## 2017-10-31 LAB — BASIC METABOLIC PANEL
BUN: 17 mg/dL (ref 6–23)
CALCIUM: 9.6 mg/dL (ref 8.4–10.5)
CO2: 27 meq/L (ref 19–32)
Chloride: 104 mEq/L (ref 96–112)
Creatinine, Ser: 0.65 mg/dL (ref 0.40–1.20)
GFR: 94.78 mL/min (ref >60.00)
Glucose, Bld: 93 mg/dL (ref 70–99)
Potassium: 4.4 mEq/L (ref 3.5–5.1)
SODIUM: 139 meq/L (ref 135–145)

## 2017-10-31 LAB — URINALYSIS, MICROSCOPIC ONLY: RBC / HPF: NONE SEEN (ref 0–?)

## 2017-11-01 ENCOUNTER — Other Ambulatory Visit: Payer: Self-pay | Admitting: Internal Medicine

## 2017-11-01 DIAGNOSIS — D473 Essential (hemorrhagic) thrombocythemia: Secondary | ICD-10-CM

## 2017-11-01 DIAGNOSIS — D75839 Thrombocytosis, unspecified: Secondary | ICD-10-CM

## 2017-11-01 NOTE — Progress Notes (Signed)
Order placed for hematology referral.  

## 2017-11-07 ENCOUNTER — Inpatient Hospital Stay: Payer: PPO

## 2017-11-07 ENCOUNTER — Other Ambulatory Visit: Payer: Self-pay

## 2017-11-07 ENCOUNTER — Inpatient Hospital Stay: Payer: PPO | Attending: Oncology | Admitting: Oncology

## 2017-11-07 ENCOUNTER — Encounter: Payer: Self-pay | Admitting: Oncology

## 2017-11-07 VITALS — BP 161/102 | HR 75 | Temp 96.4°F | Resp 18 | Ht 63.78 in | Wt 145.1 lb

## 2017-11-07 DIAGNOSIS — F17210 Nicotine dependence, cigarettes, uncomplicated: Secondary | ICD-10-CM | POA: Diagnosis not present

## 2017-11-07 DIAGNOSIS — Z79899 Other long term (current) drug therapy: Secondary | ICD-10-CM | POA: Insufficient documentation

## 2017-11-07 DIAGNOSIS — D473 Essential (hemorrhagic) thrombocythemia: Secondary | ICD-10-CM | POA: Insufficient documentation

## 2017-11-07 DIAGNOSIS — D75839 Thrombocytosis, unspecified: Secondary | ICD-10-CM

## 2017-11-07 DIAGNOSIS — J42 Unspecified chronic bronchitis: Secondary | ICD-10-CM | POA: Diagnosis not present

## 2017-11-07 LAB — CBC WITH DIFFERENTIAL/PLATELET
BASOS PCT: 1 %
Basophils Absolute: 0 10*3/uL (ref 0–0.1)
EOS ABS: 0.2 10*3/uL (ref 0–0.7)
Eosinophils Relative: 2 %
HCT: 39.5 % (ref 35.0–47.0)
Hemoglobin: 13.7 g/dL (ref 12.0–16.0)
Lymphocytes Relative: 31 %
Lymphs Abs: 2.7 10*3/uL (ref 1.0–3.6)
MCH: 31.9 pg (ref 26.0–34.0)
MCHC: 34.8 g/dL (ref 32.0–36.0)
MCV: 91.8 fL (ref 80.0–100.0)
MONO ABS: 0.6 10*3/uL (ref 0.2–0.9)
MONOS PCT: 7 %
NEUTROS PCT: 59 %
Neutro Abs: 5.1 10*3/uL (ref 1.4–6.5)
PLATELETS: 398 10*3/uL (ref 150–440)
RBC: 4.3 MIL/uL (ref 3.80–5.20)
RDW: 13.4 % (ref 11.5–14.5)
WBC: 8.6 10*3/uL (ref 3.6–11.0)

## 2017-11-07 LAB — IRON AND TIBC
Iron: 65 ug/dL (ref 28–170)
SATURATION RATIOS: 17 % (ref 10.4–31.8)
TIBC: 376 ug/dL (ref 250–450)
UIBC: 311 ug/dL

## 2017-11-07 LAB — FERRITIN: FERRITIN: 34 ng/mL (ref 11–307)

## 2017-11-07 LAB — LACTATE DEHYDROGENASE: LDH: 155 U/L (ref 98–192)

## 2017-11-07 LAB — PATHOLOGIST SMEAR REVIEW

## 2017-11-07 LAB — TSH: TSH: 2.463 u[IU]/mL (ref 0.350–4.500)

## 2017-11-07 NOTE — Progress Notes (Signed)
Here for new pt evaluation. Initial BP 161/102 (pt asymptomatic )  Repeat BP 15 min later- 164/100- again pt asymptomatic stated very nervous to be here today. Dr Tasia Catchings informed.

## 2017-11-07 NOTE — Progress Notes (Signed)
Hematology/Oncology Consult note [facility] Telephone:([phone] Fax:([phone]   Patient Care Team: Einar Pheasant, MD as PCP - General (Internal Medicine)  REFERRING PROVIDER: Einar Pheasant, MD CHIEF COMPLAINTS/PURPOSE OF CONSULTATION:  Evaluation of thrombocytosis  HISTORY OF PRESENTING ILLNESS:  Monica Morales is a  72 y.o.  female with PMH listed below who was referred to me for evaluation of thrombocytosis Patient recently had lab work done which revealed thrombocytosis, platelet counts was persistently high, ranging from 403,000 to 497,000 since October 2018.  Patient denies fatigue, weight loss, night sweating, history of thrombosis/MI/stroke. Denies any history of splenomegaly, chronic wound infection, blood in stool.   Review of Systems  Constitutional: Negative for chills, fever, malaise/fatigue and weight loss.  HENT: Negative for ear discharge and ear pain.   Eyes: Negative for photophobia and pain.  Respiratory: Negative for cough, hemoptysis, sputum production and shortness of breath.   Cardiovascular: Negative for chest pain, palpitations and orthopnea.  Gastrointestinal: Negative for abdominal pain, diarrhea, nausea and vomiting.  Genitourinary: Negative for dysuria and urgency.  Musculoskeletal: Negative for back pain, myalgias and neck pain.  Skin: Negative for rash.  Neurological: Negative for dizziness, tingling, tremors, sensory change and speech change.  Endo/Heme/Allergies: Negative for environmental allergies. Does not bruise/bleed easily.  Psychiatric/Behavioral: Negative for depression and suicidal ideas.    MEDICAL HISTORY:  Past Medical History:  Diagnosis Date  . Chronic bronchitis ([place])   . Hypercholesterolemia   . Migraines     SURGICAL HISTORY: Past Surgical History:  Procedure Laterality Date  . ABDOMINAL HYSTERECTOMY  1979   secondary to fibroids, incidental appendectomy  . BREAST BIOPSY  1979   benign  . TONSILLECTOMY  1960    SOCIAL HISTORY: Social History   Socioeconomic History  . Marital status: Married    Spouse name: Not on file  . Number of children: 1  . Years of education: Not on file  . Highest education level: Not on file  Occupational History  . Not on file  Social Needs  . Financial resource strain: Not on file  . Food insecurity:    Worry: Not on file    Inability: Not on file  . Transportation needs:    Medical: Not on file    Non-medical: Not on file  Tobacco Use  . Smoking status: Current Every Day Smoker    Packs/day: 1.00  . Smokeless tobacco: Never Used  . Tobacco comment: is going to try electronic cigarettes  Substance and Sexual Activity  . Alcohol use: No    Alcohol/week: 0.0 oz  . Drug use: No  . Sexual activity: Never  Lifestyle  . Physical activity:    Days per week: Not on file    Minutes per session: Not on file  . Stress: Not on file  Relationships  . Social connections:    Talks on phone: Not on file    Gets together: Not on file    Attends religious service: Not on file    Active member of club or organization: Not on file    Attends meetings of clubs or organizations: Not on file    Relationship status: Not on file  . Intimate partner violence:    Fear of current or ex partner: Not on file    Emotionally abused: Not on file    Physically abused: Not on file    Forced sexual activity: Not on file  Other Topics Concern  . Not on file  Social History  Narrative  . Not on file    FAMILY HISTORY: Family History  Problem Relation Age of Onset  . Heart disease Father        myocardial infarction - died age 55  . Heart disease Mother   . Diabetes Mother   . Diabetes Maternal Grandmother   . Breast cancer Neg Hx   . Colon cancer Neg Hx     ALLERGIES:  is allergic to no known drug allergy.  MEDICATIONS:  Current Outpatient Medications  Medication Sig Dispense Refill  . atorvastatin (LIPITOR) 20 MG tablet TAKE  ONE (1) TABLET BY MOUTH EVERY DAY 90 tablet 1   No current facility-administered medications for this visit.      PHYSICAL EXAMINATION: ECOG PERFORMANCE STATUS: 0 - Asymptomatic Vitals:   11/07/17 1049  BP: (!) 161/102  Pulse: 75  Resp: 18  Temp: (!) 96.4 F (35.8 C)   Filed Weights   11/07/17 1049  Weight: 145 lb 1.6 oz (65.8 kg)    Physical Exam  Constitutional: She is oriented to person, place, and time. She appears well-developed and well-nourished. No distress.  HENT:  Head: Normocephalic and atraumatic.  Eyes: Pupils are equal, round, and reactive to light. EOM are normal. Left eye exhibits no discharge. No scleral icterus.  Neck: Neck supple.  Cardiovascular: Normal rate and regular rhythm.  No murmur heard. Pulmonary/Chest: Effort normal and breath sounds normal.  Abdominal: Soft. Bowel sounds are normal. She exhibits no distension.  Musculoskeletal: Normal range of motion. She exhibits no edema.  Neurological: She is alert and oriented to person, place, and time. No cranial nerve deficit. Coordination abnormal.  Skin: Skin is warm and dry.  Psychiatric: She has a normal mood and affect. Her behavior is normal.     LABORATORY DATA:  I have reviewed the data as listed Lab Results  Component Value Date   WBC 6.8 10/31/2017   HGB 13.9 10/31/2017   HCT 40.7 10/31/2017   MCV 92.1 10/31/2017   PLT 497.0 (H) 10/31/2017   Recent Labs    02/02/17 1054 04/25/17 0853 10/31/17 0841  NA 139 139 139  K 4.4 4.5 4.4  CL 106 105 104  CO2 '26 27 27  ' GLUCOSE 97 93 93  BUN '19 12 17  ' CREATININE 0.85 0.68 0.65  CALCIUM 9.6 9.8 9.6  PROT 6.8 7.3 7.1  ALBUMIN 4.3 4.5 4.3  AST '16 14 14  ' ALT '15 15 15  ' ALKPHOS 85 83 88  BILITOT 0.5 0.5 0.6  BILIDIR  --  0.1 0.1       ASSESSMENT & PLAN:  1. Thrombocytosis ([place])    I discussed with patient that the differential diagnosis of the thrombosis is broad, including benign etiology such as reactive to surgery, trauma,  infection, nutrition deficiency, etc, as well as malignant etiology including underlying bone marrow disorders.   For the work up of patient's thrombocytosis, I recommend checking CBC;CMP, LDH, pathology smear review, iron panel , if negative will order JaK 2 mutation with reflex to CALR and MPL, BCR abl gene.  Also discussed possible bone marrow biopsy if above workup is inconclusive; however I would prefer not to do a bone marrow unless absolutely needed. She has mildly elevated thrombocytosis, no need for anti-platelet or anticoagualtion.   All questions were answered. The patient knows to call the clinic with any problems questions or concerns.  Return of visit: 2 weeks to discuss results.  Thank you for this kind referral and the opportunity  to participate in the care of this patient. A copy of today's note is routed to referring provider  Total face to face encounter time for this patient visit was 60 min. >50% of the time was  spent in counseling and coordination of care.   Earlie Server, MD, PhD Hematology Oncology [facility] at [facility] Pager- [phone] 11/07/2017

## 2017-11-08 LAB — ANA W/REFLEX: Anti Nuclear Antibody(ANA): NEGATIVE

## 2017-11-23 ENCOUNTER — Inpatient Hospital Stay: Payer: PPO

## 2017-11-23 ENCOUNTER — Encounter: Payer: Self-pay | Admitting: Oncology

## 2017-11-23 ENCOUNTER — Inpatient Hospital Stay: Payer: PPO | Attending: Oncology | Admitting: Oncology

## 2017-11-23 ENCOUNTER — Other Ambulatory Visit: Payer: Self-pay

## 2017-11-23 VITALS — BP 129/80 | HR 82 | Temp 98.1°F | Wt 144.1 lb

## 2017-11-23 DIAGNOSIS — Z79899 Other long term (current) drug therapy: Secondary | ICD-10-CM | POA: Insufficient documentation

## 2017-11-23 DIAGNOSIS — J42 Unspecified chronic bronchitis: Secondary | ICD-10-CM | POA: Insufficient documentation

## 2017-11-23 DIAGNOSIS — F17210 Nicotine dependence, cigarettes, uncomplicated: Secondary | ICD-10-CM | POA: Insufficient documentation

## 2017-11-23 DIAGNOSIS — D473 Essential (hemorrhagic) thrombocythemia: Secondary | ICD-10-CM | POA: Diagnosis not present

## 2017-11-23 DIAGNOSIS — D75839 Thrombocytosis, unspecified: Secondary | ICD-10-CM

## 2017-11-23 NOTE — Progress Notes (Signed)
Hematology/Oncology Consult note [facility] Telephone:([phone] [phone]   Patient Care Team: Einar Pheasant, MD as PCP - General (Internal Medicine)  REFERRING PROVIDER: Einar Pheasant, MD CHIEF COMPLAINTS/PURPOSE OF CONSULTATION:  Evaluation of thrombocytosis  HISTORY OF PRESENTING ILLNESS:  Monica Morales is a  72 y.o.  female with PMH listed below who was referred to me for evaluation of thrombocytosis Patient recently had lab work done which revealed thrombocytosis, platelet counts was persistently high, ranging from 403,000 to 497,000 since October 2018.  Patient denies fatigue, weight loss, night sweating, history of thrombosis/MI/stroke. Denies any history of splenomegaly, chronic wound infection, blood in stool.   Review of Systems  Constitutional: Negative for chills, fever, malaise/fatigue and weight loss.  HENT: Negative for ear discharge and ear pain.   Eyes: Negative for photophobia and pain.  Respiratory: Negative for cough, hemoptysis, sputum production and shortness of breath.   Cardiovascular: Negative for chest pain, palpitations and orthopnea.  Gastrointestinal: Negative for abdominal pain, diarrhea, nausea and vomiting.  Genitourinary: Negative for dysuria and urgency.  Musculoskeletal: Negative for back pain, myalgias and neck pain.  Skin: Negative for rash.  Neurological: Negative for dizziness, tingling, tremors, sensory change and speech change.  Endo/Heme/Allergies: Negative for environmental allergies. Does not bruise/bleed easily.  Psychiatric/Behavioral: Negative for depression and suicidal ideas.    MEDICAL HISTORY:  Past Medical History:  Diagnosis Date  . Chronic bronchitis ([place])   . Hypercholesterolemia   . Migraines     SURGICAL HISTORY: Past Surgical History:  Procedure Laterality Date  . ABDOMINAL HYSTERECTOMY  1979   secondary to fibroids, incidental appendectomy  . BREAST BIOPSY  1979   benign  . TONSILLECTOMY  1960    SOCIAL HISTORY: Social History   Socioeconomic History  . Marital status: Married    Spouse name: Not on file  . Number of children: 1  . Years of education: Not on file  . Highest education level: Not on file  Occupational History  . Not on file  Social Needs  . Financial resource strain: Not on file  . Food insecurity:    Worry: Not on file    Inability: Not on file  . Transportation needs:    Medical: Not on file    Non-medical: Not on file  Tobacco Use  . Smoking status: Current Every Day Smoker    Packs/day: 1.00    Years: 50.00    Pack years: 50.00    Types: Cigarettes  . Smokeless tobacco: Never Used  . Tobacco comment: is going to try electronic cigarettes  Substance and Sexual Activity  . Alcohol use: No    Alcohol/week: 0.0 oz  . Drug use: No  . Sexual activity: Not Currently  Lifestyle  . Physical activity:    Days per week: Not on file    Minutes per session: Not on file  . Stress: Not on file  Relationships  . Social connections:    Talks on phone: Not on file    Gets together: Not on file    Attends religious service: Not on file    Active member of club or organization: Not on file    Attends meetings of clubs or organizations: Not on file    Relationship status: Not on file  . Intimate partner violence:    Fear of current or ex partner: Not on file    Emotionally abused: Not on file    Physically abused: Not on file    Forced  sexual activity: Not on file  Other Topics Concern  . Not on file  Social History Narrative  . Not on file    FAMILY HISTORY: Family History  Problem Relation Age of Onset  . Heart disease Father        myocardial infarction - died age 45  . Heart disease Mother   . Diabetes Mother   . Diabetes Maternal Grandmother   . Breast cancer Neg Hx   . Colon cancer Neg Hx     ALLERGIES:  is allergic to no known drug allergy.  MEDICATIONS:  Current Outpatient Medications    Medication Sig Dispense Refill  . atorvastatin (LIPITOR) 20 MG tablet TAKE ONE (1) TABLET BY MOUTH EVERY DAY 90 tablet 1  . Ibuprofen (ADVIL) 200 MG CAPS Take by mouth.     No current facility-administered medications for this visit.      PHYSICAL EXAMINATION: ECOG PERFORMANCE STATUS: 0 - Asymptomatic Vitals:   11/23/17 1329  BP: 129/80  Pulse: 82  Temp: 98.1 F (36.7 C)   Filed Weights   11/23/17 1329  Weight: 144 lb 2 oz (65.4 kg)    Physical Exam  Constitutional: She is oriented to person, place, and time. She appears well-developed and well-nourished. No distress.  HENT:  Head: Normocephalic and atraumatic.  Eyes: Pupils are equal, round, and reactive to light. EOM are normal. Left eye exhibits no discharge. No scleral icterus.  Neck: Neck supple.  Cardiovascular: Normal rate and regular rhythm.  No murmur heard. Pulmonary/Chest: Effort normal and breath sounds normal.  Abdominal: Soft. Bowel sounds are normal. She exhibits no distension.  Musculoskeletal: Normal range of motion. She exhibits no edema.  Neurological: She is alert and oriented to person, place, and time. No cranial nerve deficit. Coordination abnormal.  Skin: Skin is warm and dry.  Psychiatric: She has a normal mood and affect. Her behavior is normal.     LABORATORY DATA:  I have reviewed the data as listed Lab Results  Component Value Date   WBC 8.6 11/07/2017   HGB 13.7 11/07/2017   HCT 39.5 11/07/2017   MCV 91.8 11/07/2017   PLT 398 11/07/2017   Recent Labs    02/02/17 1054 04/25/17 0853 10/31/17 0841  NA 139 139 139  K 4.4 4.5 4.4  CL 106 105 104  CO2 26 27 27   GLUCOSE 97 93 93  BUN 19 12 17   CREATININE 0.85 0.68 0.65  CALCIUM 9.6 9.8 9.6  PROT 6.8 7.3 7.1  ALBUMIN 4.3 4.5 4.3  AST 16 14 14   ALT 15 15 15   ALKPHOS 85 83 88  BILITOT 0.5 0.5 0.6  BILIDIR  --  0.1 0.1       ASSESSMENT & PLAN:  1. Thrombocytosis ([place])    # Repeat CBC showed that thrombocytosis is  resolved.  # Iron panel showed low normal iron store.  Discussed with patient that since her platelet counts have resolved, will hod additional work up for now.  Continue monitor, repeat CBC in 6 months.   I discussed with patient that the differential diagnosis of the thrombosis is broad, including benign etiology such as reactive to surgery, trauma, infection, nutrition deficiency, etc, as well as malignant etiology including underlying bone marrow disorders.   All questions were answered. The patient knows to call the clinic with any problems questions or concerns.  Return of visit: 6 months.  Earlie Server, MD, PhD Hematology Oncology [facility] at [facility] Pager- [phone] 11/23/2017

## 2017-11-23 NOTE — Progress Notes (Signed)
Patient here today for follow today

## 2018-02-03 DIAGNOSIS — N390 Urinary tract infection, site not specified: Secondary | ICD-10-CM | POA: Diagnosis not present

## 2018-02-03 DIAGNOSIS — R300 Dysuria: Secondary | ICD-10-CM | POA: Diagnosis not present

## 2018-02-13 ENCOUNTER — Ambulatory Visit (INDEPENDENT_AMBULATORY_CARE_PROVIDER_SITE_OTHER): Payer: PPO | Admitting: Internal Medicine

## 2018-02-13 ENCOUNTER — Encounter: Payer: Self-pay | Admitting: Internal Medicine

## 2018-02-13 VITALS — BP 138/92 | HR 94 | Temp 97.6°F | Resp 18 | Wt 145.4 lb

## 2018-02-13 DIAGNOSIS — R55 Syncope and collapse: Secondary | ICD-10-CM

## 2018-02-13 DIAGNOSIS — D75839 Thrombocytosis, unspecified: Secondary | ICD-10-CM

## 2018-02-13 DIAGNOSIS — R030 Elevated blood-pressure reading, without diagnosis of hypertension: Secondary | ICD-10-CM | POA: Diagnosis not present

## 2018-02-13 DIAGNOSIS — Z720 Tobacco use: Secondary | ICD-10-CM

## 2018-02-13 DIAGNOSIS — D473 Essential (hemorrhagic) thrombocythemia: Secondary | ICD-10-CM

## 2018-02-13 DIAGNOSIS — E7800 Pure hypercholesterolemia, unspecified: Secondary | ICD-10-CM

## 2018-02-13 LAB — HEPATIC FUNCTION PANEL
ALT: 14 U/L (ref 0–35)
AST: 15 U/L (ref 0–37)
Albumin: 4.3 g/dL (ref 3.5–5.2)
Alkaline Phosphatase: 79 U/L (ref 39–117)
BILIRUBIN DIRECT: 0.1 mg/dL (ref 0.0–0.3)
BILIRUBIN TOTAL: 0.4 mg/dL (ref 0.2–1.2)
Total Protein: 7.1 g/dL (ref 6.0–8.3)

## 2018-02-13 LAB — CBC WITH DIFFERENTIAL/PLATELET
Basophils Absolute: 0 10*3/uL (ref 0.0–0.1)
Basophils Relative: 0.3 % (ref 0.0–3.0)
EOS PCT: 1.8 % (ref 0.0–5.0)
Eosinophils Absolute: 0.2 10*3/uL (ref 0.0–0.7)
HCT: 39.1 % (ref 36.0–46.0)
Hemoglobin: 13.2 g/dL (ref 12.0–15.0)
LYMPHS ABS: 2.4 10*3/uL (ref 0.7–4.0)
Lymphocytes Relative: 23.8 % (ref 12.0–46.0)
MCHC: 33.6 g/dL (ref 30.0–36.0)
MCV: 93.5 fl (ref 78.0–100.0)
Monocytes Absolute: 0.7 10*3/uL (ref 0.1–1.0)
Monocytes Relative: 6.8 % (ref 3.0–12.0)
NEUTROS ABS: 6.8 10*3/uL (ref 1.4–7.7)
Neutrophils Relative %: 67.3 % (ref 43.0–77.0)
Platelets: 403 10*3/uL — ABNORMAL HIGH (ref 150.0–400.0)
RBC: 4.18 Mil/uL (ref 3.87–5.11)
RDW: 13.9 % (ref 11.5–15.5)
WBC: 10.1 10*3/uL (ref 4.0–10.5)

## 2018-02-13 LAB — BASIC METABOLIC PANEL
BUN: 13 mg/dL (ref 6–23)
CALCIUM: 9.8 mg/dL (ref 8.4–10.5)
CO2: 28 meq/L (ref 19–32)
CREATININE: 0.78 mg/dL (ref 0.40–1.20)
Chloride: 104 mEq/L (ref 96–112)
GFR: 76.73 mL/min (ref >60.00)
GLUCOSE: 90 mg/dL (ref 70–99)
Potassium: 4.7 mEq/L (ref 3.5–5.1)
Sodium: 140 mEq/L (ref 135–145)

## 2018-02-13 LAB — TSH: TSH: 2.27 u[IU]/mL (ref 0.35–4.50)

## 2018-02-13 NOTE — Progress Notes (Signed)
[name], female   DOB: 03-25-1946, 72 y.o.   MRN: [id number]   [name], female    DOB: [birth year], 72 y.o.   MRN: [id number]  HPI  Patient here for a scheduled follow up.  She is accompanied by her granddaughter.  History obtained from both of them.  Reports having an episode of syncope two weeks ago.  States she had been out around town all day and did not eat much.  Came home started cooking.  Sat to eat and as she was eating, noted she did not feel good.  Felt sick.  Then passed out.  Her son informed her she was out for approximately 15 seconds.  When she came to - had one episode of emesis.  EMS called.  States EKG and blood pressure ok.  Reports did not check her blood sugar.  Declined ER evaluation at that time.  No chest pain.  No dizziness.  States felt weak the rest of the day and some the next day.  Back to normal now.  Had similar episode one year ago.  Passed out.  States with that episode, she did not eat a lot that day and started feeling funny.  Sat down and passed out.  After occurred, ate something and felt better.  No seizure activity noted.  Stays active.  No chest pain.  No increased heart rate or palpitations.  Breathing stable.  Still smoking.  Have discussed the need to quit.  No acid reflux.  No abdominal pain.  10 days ago, she was diagnosed with UTI.  Treated. No symptoms now.  Of note, saw Dr Tasia Catchings 11/2017 for thrombocytosis.  Repeat cbc - resolved.  Recommended f/u cbc in 6 months.     Past Medical History:  Diagnosis Date  . Chronic bronchitis ([place])   . Hypercholesterolemia   . Migraines    Past Surgical History:  Procedure Laterality Date  . ABDOMINAL HYSTERECTOMY  1979   secondary to fibroids, incidental appendectomy  . BREAST BIOPSY  1979   benign  . TONSILLECTOMY  1960   Family History  Problem Relation Age of Onset  . Heart disease Father        myocardial infarction - died age 40  . Heart disease  Mother   . Diabetes Mother   . Diabetes Maternal Grandmother   . Breast cancer Neg Hx   . Colon cancer Neg Hx    Social History   Socioeconomic History  . Marital status: Married    Spouse name: Not on file  . Number of children: 1  . Years of education: Not on file  . Highest education level: Not on file  Occupational History  . Not on file  Social Needs  . Financial resource strain: Not on file  . Food insecurity:    Worry: Not on file    Inability: Not on file  . Transportation needs:    Medical: Not on file    Non-medical: Not on file  Tobacco Use  . Smoking status: Current Every Day Smoker    Packs/day: 1.00    Years: 50.00    Pack years: 50.00    Types: Cigarettes  . Smokeless tobacco: Never Used  . Tobacco comment: is going to try electronic cigarettes  Substance and Sexual Activity  . Alcohol use: No    Alcohol/week: 0.0 oz  . Drug use: No  . Sexual activity: Not Currently  Lifestyle  .  Physical activity:    Days per week: Not on file    Minutes per session: Not on file  . Stress: Not on file  Relationships  . Social connections:    Talks on phone: Not on file    Gets together: Not on file    Attends religious service: Not on file    Active member of club or organization: Not on file    Attends meetings of clubs or organizations: Not on file    Relationship status: Not on file  Other Topics Concern  . Not on file  Social History Narrative  . Not on file    Outpatient Encounter Medications as of 02/13/2018  Medication Sig  . atorvastatin (LIPITOR) 20 MG tablet TAKE ONE (1) TABLET BY MOUTH EVERY DAY  . Ibuprofen (ADVIL) 200 MG CAPS Take by mouth.   No facility-administered encounter medications on file as of 02/13/2018.     Review of Systems  Constitutional: Negative for appetite change and unexpected weight change.  HENT: Negative for congestion and sinus pressure.   Respiratory: Negative for cough, chest tightness and shortness of breath.     Cardiovascular: Negative for chest pain, palpitations and leg swelling.  Gastrointestinal: Negative for abdominal pain, diarrhea, nausea and vomiting.  Genitourinary: Negative for difficulty urinating and dysuria.  Musculoskeletal: Negative for joint swelling and myalgias.  Skin: Negative for color change and rash.  Neurological: Positive for syncope. Negative for dizziness, light-headedness and headaches.  Psychiatric/Behavioral: Negative for agitation and dysphoric mood.       Objective:     Blood pressure rechecked by me:  134/86  Physical Exam  Constitutional: She appears well-developed and well-nourished. No distress.  HENT:  Nose: Nose normal.  Mouth/Throat: Oropharynx is clear and moist.  Neck: Neck supple. No thyromegaly present.  Cardiovascular: Normal rate and regular rhythm.  Pulmonary/Chest: Breath sounds normal. No respiratory distress. She has no wheezes.  Abdominal: Soft. Bowel sounds are normal. There is no tenderness.  Musculoskeletal: She exhibits no edema or tenderness.  Lymphadenopathy:    She has no cervical adenopathy.  Skin: No rash noted. No erythema.  Psychiatric: She has a normal mood and affect. Her behavior is normal.    BP (!) 138/92 (BP Location: Left Arm, Patient Position: Sitting, Cuff Size: Normal)   Pulse 94   Temp 97.6 F (36.4 C) (Oral)   Resp 18   Wt 145 lb 6.4 oz (66 kg)   SpO2 96%   BMI 25.13 kg/m  Wt Readings from Last 3 Encounters:  02/13/18 145 lb 6.4 oz (66 kg)  11/23/17 144 lb 2 oz (65.4 kg)  11/07/17 145 lb 1.6 oz (65.8 kg)     Lab Results  Component Value Date   WBC 10.1 02/13/2018   HGB 13.2 02/13/2018   HCT 39.1 02/13/2018   PLT 403.0 (H) 02/13/2018   GLUCOSE 90 02/13/2018   CHOL 170 10/31/2017   TRIG 183.0 (H) 10/31/2017   HDL 55.30 10/31/2017   LDLDIRECT 81.0 09/29/2016   LDLCALC 78 10/31/2017   ALT 14 02/13/2018   AST 15 02/13/2018   NA 140 02/13/2018   K 4.7 02/13/2018   CL 104 02/13/2018   CREATININE  0.78 02/13/2018   BUN 13 02/13/2018   CO2 28 02/13/2018   TSH 2.27 02/13/2018   MICROALBUR 0.7 01/24/2017    Dg Bone Density  Result Date: 10/25/2017 EXAM: DUAL X-RAY ABSORPTIOMETRY (DXA) FOR BONE MINERAL DENSITY IMPRESSION: Dear Dr Nicki Reaper, Your patient Savilla Turbyfill completed a  BMD test on 10/25/2017 using the [place] (analysis version: 14.10) manufactured by [employer]. The following summarizes the results of our evaluation. PATIENT BIOGRAPHICAL: Name: Camerin, Jimenez Patient ID: [id number] Birth Date: 17-Apr-1946 Height: 63.0 in. Gender: Female Exam Date: 10/25/2017 Weight: 145.8 lbs. Indications: Advanced Age, Caucasian, Hysterectomy, Oophorectomy Bilateral, Postmenopausal, Tobacco User(Current Smoker) Fractures: Treatments: ASSESSMENT: The BMD measured at Femur Neck Left is 0.758 g/cm2 with a T-score of -2.0. This patient is considered osteopenic according to [place] [facility] [facility]) criteria. Lumbar spine was not utilized due to advanced degenerative changes and scoliosis. Site Region Measured Measured WHO Young Adult BMD Date       Age      Classification T-score DualFemur Neck Left 10/25/2017 73.7 Osteopenia -2.0 0.758 g/cm2 DualFemur Total Mean 10/25/2017 73.7 Normal -0.9 0.889 g/cm2 Left Forearm Radius 33% 10/25/2017 73.7 Normal -0.9 0.801 g/cm2 World Health Organization [facility]) criteria for post-menopausal, Caucasian Women: Normal:       T-score at or above -1 SD Osteopenia:   T-score between -1 and -2.5 SD Osteoporosis: T-score at or below -2.5 SD RECOMMENDATIONS: 1. All patients should optimize calcium and vitamin D intake. 2. Consider FDA-approved medical therapies in postmenopausal women and men aged 70 years and older, based on the following: a. A hip or vertebral(clinical or morphometric) fracture b. T-score < -2.5 at the femoral neck or spine after appropriate evaluation to exclude secondary causes c. Low bone mass (T-score between -1.0 and -2.5 at the femoral neck  or spine) and a 10-year probability of a hip fracture > 3% or a 10-year probability of a major osteoporosis-related fracture > 20% based on the [place]-adapted WHO algorithm d. Clinician judgment and/or patient preferences may indicate treatment for people with 10-year fracture probabilities above or below these levels FOLLOW-UP: Patients with diagnosis of osteoporosis or at high risk for fracture should have regular bone mineral density tests. For patients eligible for Medicare, routine testing is allowed once every 2 years. The testing frequency can be increased to one year for patients who have rapidly progressing disease, those who are receiving or discontinuing medical therapy to restore bone mass, or have additional risk factors. I have reviewed this report, and agree with the above findings. [facility] Radiology Dear Dr Nicki Reaper, Your patient KAMMIE SCIOLI completed a FRAX assessment on 10/25/2017 using the [place] (analysis version: 14.10) manufactured by [employer]. The following summarizes the results of our evaluation. PATIENT BIOGRAPHICAL: Name: Marquette, Piontek Patient ID: [id number] Birth Date: January 11, 1946 Height:    63.0 in. Gender:     Female    [age]       Weight:    145.8 lbs. Ethnicity:  White                            Exam Date: 10/25/2017 FRAX* RESULTS:  (version: 3.5) 10-year Probability of Fracture1 Major Osteoporotic Fracture2 Hip Fracture 13.2% 3.2% Population: [place] (Caucasian) Risk Factors: None Based on Femur (Left) Neck BMD 1 -The 10-year probability of fracture may be lower than reported if the patient has received treatment. 2 -Major Osteoporotic Fracture: Clinical Spine, Forearm, Hip or Shoulder *FRAX is a Materials engineer of the [employer] for Metabolic Bone Disease, a [place] (WHO) [facility]. ASSESSMENT: The probability of a major osteoporotic fracture is 13.2% within the next ten years. The  probability of a  hip fracture is 3.2% within the next ten years. . Electronically Signed   By: Lowella Grip III M.D.   On: 10/25/2017 13:18       Assessment & Plan:   Problem List Items Addressed This Visit    Elevated blood pressure reading    Recheck improved.  Under good control on her outside checks.  Hold on medication.  Follow.        Hypercholesterolemia    On lipitor.  Follow lipid panel and liver function tests.        Syncope - Primary    Has had two episodes as outlined. Both episodes occurred when she had been active all day and had not eaten a large amount.  Felt sick with this last episode. Question of vasovagal syncope.  Discussed importance of eating regularly and staying hydrated.  EKG obtained given syncope and risk factors.  EKG - SR with TWI in V2 - v6.  Given EKG and risk factors, I discussed with her regarding further cardiac evaluation.  She was in agreement.  Will check routine labs - electrolytes, etc.  Hold on scanning.  No seizure activity noted.  Start with above cardiac evaluation.  Pt in agreement.        Relevant Orders   EKG 12-Lead (Completed)   Ambulatory referral to Cardiology   CBC with Differential/Platelet (Completed)   Hepatic function panel (Completed)   TSH (Completed)   Basic metabolic panel (Completed)   Thrombocytosis ([place])    Worked up by hematology 11/2017.  Resolved.  Recommended f/u cbc in 6 months.        Tobacco abuse    Have discussed the need to quit smoking.  Discussed with her today regarding screening chest CT.  Will notify me if changes her mind.           I spent 40 minutes with the patient and more than 50% of the time was spent in consultation regarding the above.  Time spent discussing with her and her granddaughter Ms Madariaga current symptoms and concerns.  Time also spent discussing treatment and plan for further evaluation.     Einar Pheasant, MD

## 2018-02-14 ENCOUNTER — Encounter: Payer: Self-pay | Admitting: Internal Medicine

## 2018-02-14 DIAGNOSIS — R55 Syncope and collapse: Secondary | ICD-10-CM | POA: Insufficient documentation

## 2018-02-14 NOTE — Assessment & Plan Note (Signed)
Have discussed the need to quit smoking.  Discussed with her today regarding screening chest CT.  Will notify me if changes her mind.

## 2018-02-14 NOTE — Assessment & Plan Note (Signed)
Has had two episodes as outlined. Both episodes occurred when she had been active all day and had not eaten a large amount.  Felt sick with this last episode. Question of vasovagal syncope.  Discussed importance of eating regularly and staying hydrated.  EKG obtained given syncope and risk factors.  EKG - SR with TWI in V2 - v6.  Given EKG and risk factors, I discussed with her regarding further cardiac evaluation.  She was in agreement.  Will check routine labs - electrolytes, etc.  Hold on scanning.  No seizure activity noted.  Start with above cardiac evaluation.  Pt in agreement.

## 2018-02-14 NOTE — Assessment & Plan Note (Signed)
On lipitor.  Follow lipid panel and liver function tests.   

## 2018-02-14 NOTE — Assessment & Plan Note (Signed)
Worked up by hematology 11/2017.  Resolved.  Recommended f/u cbc in 6 months.

## 2018-02-14 NOTE — Assessment & Plan Note (Signed)
Recheck improved.  Under good control on her outside checks.  Hold on medication.  Follow.

## 2018-04-03 ENCOUNTER — Ambulatory Visit: Payer: PPO | Admitting: Internal Medicine

## 2018-04-16 NOTE — Progress Notes (Signed)
Cardiology Office Note  Date:  04/17/2018   ID:  Oreatha, Fabry Sep 15, 1945, MRN [id number]  PCP:  Einar Pheasant, MD   Chief Complaint  Patient presents with  . other    Ref by Dr. Einar Pheasant for abnormal EKG & syncope. Meds reviewed by the pt. verbally. Pt. c/o two spells of syncope in the past but no recent episodes. Denies chest pain or shortness of breath.     HPI:  72 yo woman with past medical history of Smoker Syncope Presents by referral from Dr. Nicki Reaper for consultation of her syncope  She reports having 2 episodes of syncope  episode of syncope 01/2018  out around town all day and did not eat much.   Came home started cooking.  Sat to eat and as she was eating, noted she did not feel good.  took a couple of bites, Felt sick, profound nausea,  severe.  Got up to move to chair,  Then passed out. Then emesis she woke up   out for approximately 15 seconds by family at the bedside.   EMS called.  States EKG and blood pressure ok.  Reports did not check her blood sugar.  Declined ER  similar episode one year ago.  Was out very busy that day , did not eat a lot  and started feeling funny.  Sat down and as she was starting to eat passed out.  After occurred, ate something and felt better.    saw Dr Tasia Catchings 11/2017 for thrombocytosis.  Repeat cbc - resolved.    CT ABD pelvis: 01/2017 mild diffuse ABD athero  EKG personally reviewed by myself on todays visit Shows normal sinus rhythm with rate 86 bpm T wave abnormality anterolateral leads V4 through V6  Orthostatics done in the office were negative blood pressure around 518 systolic heart rate 84-16  Previous stress test reviewed from 2009  PMH:   has a past medical history of Chronic bronchitis ([place]), Hypercholesterolemia, and Migraines.  PSH:    Past Surgical History:  Procedure Laterality Date  . ABDOMINAL HYSTERECTOMY  1979   secondary to fibroids, incidental appendectomy  . BREAST BIOPSY  1979   benign  .  TONSILLECTOMY  1960    Current Outpatient Medications  Medication Sig Dispense Refill  . atorvastatin (LIPITOR) 20 MG tablet TAKE ONE (1) TABLET BY MOUTH EVERY DAY 90 tablet 1  . Ibuprofen (ADVIL) 200 MG CAPS Take by mouth.     No current facility-administered medications for this visit.      Allergies:   No known drug allergy   Social History:  The patient  reports that she has been smoking cigarettes. She has a 50.00 pack-year smoking history. She has never used smokeless tobacco. She reports that she does not drink alcohol or use drugs.   Family History:   family history includes Diabetes in her maternal grandmother and mother; Heart disease in her father and mother.    Review of Systems: Review of Systems  Constitutional: Negative.   Respiratory: Negative.   Cardiovascular: Negative.   Gastrointestinal: Negative.   Musculoskeletal: Negative.   Neurological: Positive for loss of consciousness.  Psychiatric/Behavioral: Negative.   All other systems reviewed and are negative.   PHYSICAL EXAM: VS:  BP (!) 140/92 (BP Location: Right Arm, Patient Position: Sitting, Cuff Size: Normal)   Pulse 86   Ht 5\' 4"  (1.626 m)   Wt 144 lb 8 oz (65.5 kg)   BMI 24.80 kg/m  , BMI  Body mass index is 24.8 kg/m. GEN: Well nourished, well developed, in no acute distress  HEENT: normal  Neck: no JVD, carotid bruits, or masses Cardiac: RRR; no murmurs, rubs, or gallops,no edema  Respiratory:  clear to auscultation bilaterally, normal work of breathing GI: soft, nontender, nondistended, + BS MS: no deformity or atrophy  Skin: warm and dry, no rash Neuro:  Strength and sensation are intact Psych: euthymic mood, full affect  Recent Labs: 02/13/2018: ALT 14; BUN 13; Creatinine, Ser 0.78; Hemoglobin 13.2; Platelets 403.0; Potassium 4.7; Sodium 140; TSH 2.27    Lipid Panel Lab Results  Component Value Date   CHOL 170 10/31/2017   HDL 55.30 10/31/2017   LDLCALC 78 10/31/2017   TRIG 183.0  (H) 10/31/2017      Wt Readings from Last 3 Encounters:  04/17/18 144 lb 8 oz (65.5 kg)  02/13/18 145 lb 6.4 oz (66 kg)  11/23/17 144 lb 2 oz (65.4 kg)     ASSESSMENT AND PLAN:  Syncope, unspecified syncope type - Plan: EKG 12-Lead Most recent syncopal episode concerning for vasovagal etiology in the setting of nausea Also likely exacerbated by minimal fluid or oral intake that day Episode 1 year earlier may have been something similar Given abnormal EKG we have ordered stress testing Recommended she stay hydrated Discussed warnings of vasovagal syncope and orthostasis with her  Tobacco abuse We have encouraged continued exercise, careful diet management in an effort to lose weight.  Hypercholesterolemia On Lipitor 20 mg daily Could consider adding Zetia 10 mg daily given aortic atherosclerosis seen on CT scan of the abdomen Recommended she discussed with Dr. Nicki Reaper  Abnormal electrocardiogram - Plan: NM Myocar Multi W/Spect W/Wall Motion / EF T wave abnormality lateral leads Stress test ordered  Disposition:   F/U as needed We will call her with the results of the stress test   Total encounter time more than 60 minutes  Greater than 50% was spent in counseling and coordination of care with the patient    Orders Placed This Encounter  Procedures  . NM Myocar Multi W/Spect W/Wall Motion / EF  . EKG 12-Lead     Signed, Esmond Plants, M.D., Ph.D. 04/17/2018  [place], [place]

## 2018-04-17 ENCOUNTER — Ambulatory Visit: Payer: PPO | Admitting: Cardiovascular Disease

## 2018-04-17 ENCOUNTER — Encounter: Payer: Self-pay | Admitting: Cardiovascular Disease

## 2018-04-17 ENCOUNTER — Encounter: Payer: Self-pay | Admitting: *Deleted

## 2018-04-17 ENCOUNTER — Telehealth: Payer: Self-pay | Admitting: Cardiovascular Disease

## 2018-04-17 VITALS — BP 140/92 | HR 86 | Ht 64.0 in | Wt 144.5 lb

## 2018-04-17 DIAGNOSIS — E7800 Pure hypercholesterolemia, unspecified: Secondary | ICD-10-CM

## 2018-04-17 DIAGNOSIS — R55 Syncope and collapse: Secondary | ICD-10-CM | POA: Diagnosis not present

## 2018-04-17 DIAGNOSIS — Z720 Tobacco use: Secondary | ICD-10-CM

## 2018-04-17 DIAGNOSIS — Z716 Tobacco abuse counseling: Secondary | ICD-10-CM | POA: Diagnosis not present

## 2018-04-17 DIAGNOSIS — R9431 Abnormal electrocardiogram [ECG] [EKG]: Secondary | ICD-10-CM | POA: Diagnosis not present

## 2018-04-17 NOTE — Telephone Encounter (Signed)
I called the patient to clarify her jury duty date. Per the patient, this is set for 05/09/18.  I have advised her I will add this to her letter and this will be completed for her this afternoon.  Letter done/ signed by Dr. Rockey Situ. Placed at the front desk for pick up.

## 2018-04-17 NOTE — Patient Instructions (Addendum)
Research vasovagal epsiodes  Medication Instructions:   No medication changes made  Labwork:  No new labs needed  Testing/Procedures:  We will order a stress test nuclear, treadmill For abn ekg, syncope  [facility] MYOVIEW  Your caregiver has ordered a Stress Test with nuclear imaging. The purpose of this test is to evaluate the blood supply to your heart muscle. This procedure is referred to as a "Non-Invasive Stress Test." This is because other than having an IV started in your vein, nothing is inserted or "invades" your body. Cardiac stress tests are done to find areas of poor blood flow to the heart by determining the extent of coronary artery disease (CAD). Some patients exercise on a treadmill, which naturally increases the blood flow to your heart, while others who are  unable to walk on a treadmill due to physical limitations have a pharmacologic/chemical stress agent called Lexiscan . This medicine will mimic walking on a treadmill by temporarily increasing your coronary blood flow.   Please note: these test may take anywhere between 2-4 hours to complete  PLEASE REPORT TO [facility] AT THE FIRST DESK WILL DIRECT YOU WHERE TO GO  Date of Procedure:_____________________________________  Arrival Time for Procedure:______________________________  Instructions regarding medication:   __x__ : You may take your regular medications with enough water to get then down safely the morning of your test  PLEASE NOTIFY THE OFFICE AT LEAST 24 HOURS IN ADVANCE IF YOU ARE UNABLE TO KEEP YOUR APPOINTMENT.  [phone] AND  PLEASE NOTIFY NUCLEAR MEDICINE AT [facility] AT LEAST 24 HOURS IN ADVANCE IF YOU ARE UNABLE TO KEEP YOUR APPOINTMENT. [phone]  How to prepare for your Myoview test:  1. Do not eat or drink after midnight 2. No caffeine for 24 hours prior to test 3. No smoking 24 hours prior to test. 4. Your medication may be taken with water.  If your  doctor stopped a medication because of this test, do not take that medication. 5. Ladies, please do not wear dresses.  Skirts or pants are appropriate. Please wear a short sleeve shirt. 6. No perfume, cologne or lotion. 7. Wear comfortable walking shoes. No heels!   Follow-Up: It was a pleasure seeing you in the office today. Please call us if you have new issues that need to be addressed before your next appt.  [phone]  Your physician wants you to follow-up in:  As needed  If you need a refill on your cardiac medications before your next appointment, please call your pharmacy.  For educational health videos Log in to : [email or web address] Or : [email or web address], password : [email or web address]   Cardiac Nuclear Scan A cardiac nuclear scan is a test that measures blood flow to the heart when a person is resting and when he or she is exercising. The test looks for problems such as:  Not enough blood reaching a portion of the heart.  The heart muscle not working normally.  You may need this test if:  You have heart disease.  You have had abnormal lab results.  You have had heart surgery or angioplasty.  You have chest pain.  You have shortness of breath.  In this test, a radioactive dye (tracer) is injected into your bloodstream. After the tracer has traveled to your heart, an imaging device is used to measure how much of the tracer is absorbed by or distributed to various areas of your heart. This procedure is usually done at a hospital and takes 2-4  hours. Tell a health care provider about:  Any allergies you have.  All medicines you are taking, including vitamins, herbs, eye drops, creams, and over-the-counter medicines.  Any problems you or family members have had with the use of anesthetic medicines.  Any blood disorders you have.  Any surgeries you have had.  Any medical conditions you have.  Whether you are pregnant or may be pregnant. What are the risks? Generally, this  is a safe procedure. However, problems may occur, including:  Serious chest pain and heart attack. This is only a risk if the stress portion of the test is done.  Rapid heartbeat.  Sensation of warmth in your chest. This usually passes quickly.  What happens before the procedure?  Ask your health care provider about changing or stopping your regular medicines. This is especially important if you are taking diabetes medicines or blood thinners.  Remove your jewelry on the day of the procedure. What happens during the procedure?  An IV tube will be inserted into one of your veins.  Your health care provider will inject a small amount of radioactive tracer through the tube.  You will wait for 20-40 minutes while the tracer travels through your bloodstream.  Your heart activity will be monitored with an electrocardiogram (ECG).  You will lie down on an exam table.  Images of your heart will be taken for about 15-20 minutes.  You may be asked to exercise on a treadmill or stationary bike. While you exercise, your heart's activity will be monitored with an ECG, and your blood pressure will be checked. If you are unable to exercise, you may be given a medicine to increase blood flow to parts of your heart.  When blood flow to your heart has peaked, a tracer will again be injected through the IV tube.  After 20-40 minutes, you will get back on the exam table and have more images taken of your heart.  When the procedure is over, your IV tube will be removed. The procedure may vary among health care providers and hospitals. Depending on the type of tracer used, scans may need to be repeated 3-4 hours later. What happens after the procedure?  Unless your health care provider tells you otherwise, you may return to your normal schedule, including diet, activities, and medicines.  Unless your health care provider tells you otherwise, you may increase your fluid intake. This will help flush  the contrast dye from your body. Drink enough fluid to keep your urine clear or pale yellow.  It is up to you to get your test results. Ask your health care provider, or the department that is doing the test, when your results will be ready. Summary  A cardiac nuclear scan measures the blood flow to the heart when a person is resting and when he or she is exercising.  You may need this test if you are at risk for heart disease.  Tell your health care provider if you are pregnant.  Unless your health care provider tells you otherwise, increase your fluid intake. This will help flush the contrast dye from your body. Drink enough fluid to keep your urine clear or pale yellow. This information is not intended to replace advice given to you by your health care provider. Make sure you discuss any questions you have with your health care provider. Document Released: 07/30/2004 Document Revised: 07/07/2016 Document Reviewed: 06/13/2013 Elsevier Interactive Patient Education  2017 [place].      Vasovagal Syncope,  Adult Syncope, which is commonly known as fainting or passing out, is a temporary loss of consciousness. It occurs when the blood flow to the brain is reduced. Vasovagal syncope, also called neurocardiogenic syncope, is a fainting spell that happens when blood flow to the brain is reduced because of a sudden drop in heart rate and blood pressure. Vasovagal syncope is usually harmless. However, you can get injured if you fall during a fainting spell. What are the causes? This condition is caused by a drop in heart rate and blood pressure, usually in response to a trigger. Many things and situations can trigger an episode, including:  Pain.  Fear.  The sight of blood. This may occur during medical procedures, such as when blood is being drawn from a vein.  Common activities, such as coughing, swallowing, stretching, or going to the bathroom.  Emotional stress.  Being in a  confined space.  Prolonged standing, especially in a warm environment.  Lack of sleep or rest.  Not eating for a long time.  Not drinking enough liquids.  Recent illness.  Drinking alcohol.  Taking drugs that affect blood pressure, such as marijuana, cocaine, opiates, or inhalants.  What are the signs or symptoms? Before a fainting episode, you may:  Feel dizzy or light-headed.  Become pale.  Sense that you are going to faint.  Feel like the room is spinning.  Only see directly ahead (tunnel vision).  Feel sick to your stomach (nauseous).  See spots.  Slowly lose vision.  Hear ringing in your ears.  Have a headache.  Feel warm and sweaty.  Feel a sensation of pins and needles.  During the fainting spell, you may twitch or make jerky movements. Fainting spells usually last no longer than a few minutes before you wake up. If you get up too quickly before your body can recover, you may faint again. How is this diagnosed? This condition is diagnosed based on your symptoms, your medical history, and a physical exam. Tests may be done to rule out other causes of fainting. Tests may include:  Blood tests.  Heart tests, such as an electrocardiogram (ECG), echocardiogram, or electrophysiology study.  A test to check your response to changes in position (tilt table test).  How is this treated? Usually, treatment is not needed for this condition. Your health care provider may suggest ways to help prevent fainting episodes. These may include:  Drinking additional fluids if you are exposed to a trigger.  Sitting or lying down if you notice signs that an episode is coming.  If your fainting spells continue, your health care provider may recommend that you:  Take medicines to prevent fainting or to help reduce further episodes of fainting.  Do certain exercises.  Wear compression stockings.  Have surgery to place a pacemaker in your body (rare).  Follow these  instructions at home:  Learn to identify the signs that an episode is coming.  Sit or lie down at the first sign of a fainting spell. If you sit down, put your head down between your legs. If you lie down, swing your legs up in the air to increase blood flow to the brain.  Avoid hot tubs and saunas.  Avoid standing for a long time. If you have to stand for a long time, try: ? Crossing your legs. ? Flexing and stretching your leg muscles. ? Squatting. ? Moving your legs. ? Bending over.  Drink enough fluid to keep your urine clear or pale yellow.  Make changes to your diet that your health care provider recommends. You may be told to: ? Avoid caffeine. ? Eat more salt.  Take over-the-counter and prescription medicines only as told by your health care provider. Contact a health care provider if:  You continue to have fainting spells despite treatment.  You faint more often despite treatment.  You lose consciousness for more than a few minutes.  You faint during or after exercising or after being startled.  You have twitching or jerky movements for longer than a few seconds during a fainting spell.  You have an episode of twitching or jerky movements without fainting. Get help right away if:  A fainting spell leads to an injury or bleeding.  You have new symptoms that occur with the fainting spells, such as: ? Shortness of breath. ? Chest pain. ? Irregular heartbeat.  You twitch or make jerky movements for more than 5 minutes.  You twitch or make jerky movements during more than one fainting spell. This information is not intended to replace advice given to you by your health care provider. Make sure you discuss any questions you have with your health care provider. Document Released: 06/21/2012 Document Revised: 12/17/2015 Document Reviewed: 05/03/2015 Elsevier Interactive Patient Education  [employer].

## 2018-04-17 NOTE — Telephone Encounter (Signed)
Patient was in for visit with Dr. Rockey Situ on 9/30 and mentioned she had jury duty summons  Patients jury number is [phone] Please call with any additional questions

## 2018-04-26 ENCOUNTER — Ambulatory Visit
Admission: RE | Admit: 2018-04-26 | Discharge: 2018-04-26 | Disposition: A | Discharge status: Home / Self Care | Payer: PPO | Source: Ambulatory Visit | Attending: Cardiovascular Disease | Admitting: Cardiovascular Disease

## 2018-04-26 DIAGNOSIS — R9431 Abnormal electrocardiogram [ECG] [EKG]: Secondary | ICD-10-CM

## 2018-04-26 LAB — NM MYOCAR MULTI W/SPECT W/WALL MOTION / EF
CHL CUP MPHR: 146 {beats}/min
CHL CUP NUCLEAR SRS: 3
CSEPED: 3 min
CSEPHR: 120 %
Estimated workload: 4.6 METS
Exercise duration (sec): 1 s
LVDIAVOL: 63 mL (ref 46–106)
LVSYSVOL: 33 mL
Peak HR: 176 {beats}/min
Rest HR: 70 {beats}/min
SDS: 3
SSS: 1
TID: 0.84

## 2018-04-26 MED ORDER — TECHNETIUM TC 99M TETROFOSMIN IV KIT
10.7500 | PACK | Freq: Once | INTRAVENOUS | Status: AC | PRN
  Administered 2018-04-26: 10.75 via INTRAVENOUS

## 2018-04-26 MED ORDER — TECHNETIUM TC 99M TETROFOSMIN IV KIT
32.6220 | PACK | Freq: Once | INTRAVENOUS | Status: AC | PRN
  Administered 2018-04-26: 32.622 via INTRAVENOUS

## 2018-05-19 ENCOUNTER — Other Ambulatory Visit: Payer: Self-pay | Admitting: *Deleted

## 2018-05-19 DIAGNOSIS — D473 Essential (hemorrhagic) thrombocythemia: Secondary | ICD-10-CM

## 2018-05-19 DIAGNOSIS — D75839 Thrombocytosis, unspecified: Secondary | ICD-10-CM

## 2018-05-22 ENCOUNTER — Other Ambulatory Visit: Payer: Self-pay

## 2018-05-22 ENCOUNTER — Inpatient Hospital Stay (HOSPITAL_BASED_OUTPATIENT_CLINIC_OR_DEPARTMENT_OTHER): Payer: PPO | Admitting: Oncology

## 2018-05-22 ENCOUNTER — Encounter: Payer: Self-pay | Admitting: Oncology

## 2018-05-22 ENCOUNTER — Inpatient Hospital Stay: Payer: PPO | Attending: Oncology

## 2018-05-22 VITALS — BP 140/88 | HR 72 | Temp 97.2°F | Resp 18 | Wt 145.1 lb

## 2018-05-22 DIAGNOSIS — R7989 Other specified abnormal findings of blood chemistry: Secondary | ICD-10-CM

## 2018-05-22 DIAGNOSIS — F17210 Nicotine dependence, cigarettes, uncomplicated: Secondary | ICD-10-CM | POA: Insufficient documentation

## 2018-05-22 DIAGNOSIS — D473 Essential (hemorrhagic) thrombocythemia: Secondary | ICD-10-CM

## 2018-05-22 DIAGNOSIS — Z90710 Acquired absence of both cervix and uterus: Secondary | ICD-10-CM

## 2018-05-22 DIAGNOSIS — Z79899 Other long term (current) drug therapy: Secondary | ICD-10-CM | POA: Diagnosis not present

## 2018-05-22 DIAGNOSIS — D75839 Thrombocytosis, unspecified: Secondary | ICD-10-CM

## 2018-05-22 LAB — CBC WITH DIFFERENTIAL/PLATELET
ABS IMMATURE GRANULOCYTES: 0.02 10*3/uL (ref 0.00–0.07)
BASOS ABS: 0.1 10*3/uL (ref 0.0–0.1)
Basophils Relative: 1 %
EOS PCT: 5 %
Eosinophils Absolute: 0.4 10*3/uL (ref 0.0–0.5)
HEMATOCRIT: 39.7 % (ref 36.0–46.0)
Hemoglobin: 13.2 g/dL (ref 12.0–15.0)
Immature Granulocytes: 0 %
LYMPHS ABS: 3 10*3/uL (ref 0.7–4.0)
LYMPHS PCT: 34 %
MCH: 31.1 pg (ref 26.0–34.0)
MCHC: 33.2 g/dL (ref 30.0–36.0)
MCV: 93.6 fL (ref 80.0–100.0)
Monocytes Absolute: 0.7 10*3/uL (ref 0.1–1.0)
Monocytes Relative: 8 %
NEUTROS ABS: 4.7 10*3/uL (ref 1.7–7.7)
NRBC: 0 % (ref 0.0–0.2)
Neutrophils Relative %: 52 %
Platelets: 376 10*3/uL (ref 150–400)
RBC: 4.24 MIL/uL (ref 3.87–5.11)
RDW: 12.5 % (ref 11.5–15.5)
WBC: 8.9 10*3/uL (ref 4.0–10.5)

## 2018-05-22 NOTE — Progress Notes (Signed)
Patient here today for follow up.  Patient states no new concerns today  

## 2018-05-23 NOTE — Progress Notes (Signed)
Hematology/Oncology Consult note [facility] Telephone:([phone] [phone]   Patient Care Team: Einar Pheasant, MD as PCP - General (Internal Medicine)  REFERRING PROVIDER: Einar Pheasant, MD CHIEF COMPLAINTS/PURPOSE OF CONSULTATION:  Follow up of  thrombocytosis  HISTORY OF PRESENTING ILLNESS:  Monica Morales is a  72 y.o.  female with PMH listed below who was referred to me for evaluation of thrombocytosis Patient recently had lab work done which revealed thrombocytosis, platelet counts was persistently high, ranging from 403,000 to 497,000 since October 2018.  Patient denies fatigue, weight loss, night sweating, history of thrombosis/MI/stroke. Denies any history of splenomegaly, chronic wound infection, blood in stool.   INTERVAL HISTORY Monica Morales is a 72 y.o. female who has above history reviewed by me today presents for follow up visit for management of thrombocytosis.  Patient reports no concerns or new symptoms.  Feeling well at baseline.    Review of Systems  Constitutional: Negative for chills, fever, malaise/fatigue and weight loss.  HENT: Negative for ear discharge, ear pain, nosebleeds and sore throat.   Eyes: Negative for double vision, photophobia, pain and redness.  Respiratory: Negative for cough, hemoptysis, sputum production, shortness of breath and wheezing.   Cardiovascular: Negative for chest pain, palpitations and orthopnea.  Gastrointestinal: Negative for abdominal pain, blood in stool, diarrhea, nausea and vomiting.  Genitourinary: Negative for dysuria and urgency.  Musculoskeletal: Negative for back pain, myalgias and neck pain.  Skin: Negative for itching and rash.  Neurological: Negative for dizziness, tingling, tremors, sensory change and speech change.  Endo/Heme/Allergies: Negative for environmental allergies. Does not bruise/bleed easily.  Psychiatric/Behavioral: Negative for depression and suicidal  ideas.    MEDICAL HISTORY:  Past Medical History:  Diagnosis Date  . Chronic bronchitis ([place])   . Hypercholesterolemia   . Migraines     SURGICAL HISTORY: Past Surgical History:  Procedure Laterality Date  . ABDOMINAL HYSTERECTOMY  1979   secondary to fibroids, incidental appendectomy  . BREAST BIOPSY  1979   benign  . TONSILLECTOMY  1960    SOCIAL HISTORY: Social History   Socioeconomic History  . Marital status: Married    Spouse name: Not on file  . Number of children: 1  . Years of education: Not on file  . Highest education level: Not on file  Occupational History  . Not on file  Social Needs  . Financial resource strain: Not on file  . Food insecurity:    Worry: Not on file    Inability: Not on file  . Transportation needs:    Medical: Not on file    Non-medical: Not on file  Tobacco Use  . Smoking status: Current Every Day Smoker    Packs/day: 1.00    Years: 50.00    Pack years: 50.00    Types: Cigarettes  . Smokeless tobacco: Never Used  . Tobacco comment: is going to try electronic cigarettes  Substance and Sexual Activity  . Alcohol use: No    Alcohol/week: 0.0 standard drinks  . Drug use: No  . Sexual activity: Not Currently  Lifestyle  . Physical activity:    Days per week: Not on file    Minutes per session: Not on file  . Stress: Not on file  Relationships  . Social connections:    Talks on phone: Not on file    Gets together: Not on file    Attends religious service: Not on file    Active member of club or organization: Not  on file    Attends meetings of clubs or organizations: Not on file    Relationship status: Not on file  . Intimate partner violence:    Fear of current or ex partner: Not on file    Emotionally abused: Not on file    Physically abused: Not on file    Forced sexual activity: Not on file  Other Topics Concern  . Not on file  Social History Narrative  . Not on file    FAMILY HISTORY: Family History  Problem  Relation Age of Onset  . Heart disease Father        myocardial infarction - died age 83  . Heart disease Mother   . Diabetes Mother   . Diabetes Maternal Grandmother   . Breast cancer Neg Hx   . Colon cancer Neg Hx     ALLERGIES:  is allergic to no known drug allergy.  MEDICATIONS:  Current Outpatient Medications  Medication Sig Dispense Refill  . atorvastatin (LIPITOR) 20 MG tablet TAKE ONE (1) TABLET BY MOUTH EVERY DAY 90 tablet 1  . Ibuprofen (ADVIL) 200 MG CAPS Take by mouth.     No current facility-administered medications for this visit.      PHYSICAL EXAMINATION: ECOG PERFORMANCE STATUS: 0 - Asymptomatic Vitals:   05/22/18 1323  BP: 140/88  Pulse: 72  Resp: 18  Temp: (!) 97.2 F (36.2 C)   Filed Weights   05/22/18 1323  Weight: 145 lb 1 oz (65.8 kg)    Physical Exam  Constitutional: She is oriented to person, place, and time. No distress.  HENT:  Head: Normocephalic and atraumatic.  Mouth/Throat: Oropharynx is clear and moist.  Eyes: Pupils are equal, round, and reactive to light. EOM are normal. Left eye exhibits no discharge. No scleral icterus.  Neck: Normal range of motion. Neck supple.  Cardiovascular: Normal rate, regular rhythm and normal heart sounds.  No murmur heard. Pulmonary/Chest: Effort normal and breath sounds normal. No respiratory distress. She has no wheezes.  Abdominal: Soft. Bowel sounds are normal. She exhibits no distension. There is no tenderness.  Musculoskeletal: Normal range of motion. She exhibits no edema or deformity.  Neurological: She is alert and oriented to person, place, and time. No cranial nerve deficit. Coordination normal.  Skin: Skin is warm and dry. No rash noted. No erythema.  Psychiatric: She has a normal mood and affect. Her behavior is normal. Thought content normal.     LABORATORY DATA:  I have reviewed the data as listed Lab Results  Component Value Date   WBC 8.9 05/22/2018   HGB 13.2 05/22/2018   HCT  39.7 05/22/2018   MCV 93.6 05/22/2018   PLT 376 05/22/2018   Recent Labs    10/31/17 0841 02/13/18 1019  NA 139 140  K 4.4 4.7  CL 104 104  CO2 27 28  GLUCOSE 93 90  BUN 17 13  CREATININE 0.65 0.78  CALCIUM 9.6 9.8  PROT 7.1 7.1  ALBUMIN 4.3 4.3  AST 14 15  ALT 15 14  ALKPHOS 88 79  BILITOT 0.6 0.4  BILIDIR 0.1 0.1       ASSESSMENT & PLAN:  1. Thrombocytosis ([place])   Labs reviewed and discussed with her. Thrombocytosis has completely resolved. Discussed with patient that her previous thrombosis could be reactive. We have monitored her count for the past 6 months and has been stable. I do not think she needs any additional work-up at this point. She can continue to  follow-up with primary care physician.  I am happy to see her again if there are new concerns or symptoms..  All questions were answered. The patient knows to call the clinic with any problems questions or concerns.  Return of visit: As needed.  Total face to face encounter time for this patient visit was 15 min. >50% of the time was  spent in counseling and coordination of care.   Earlie Server, MD, PhD Hematology Oncology [facility] at [facility] Pager- [phone] 05/23/2018

## 2018-05-25 ENCOUNTER — Encounter: Payer: Self-pay | Admitting: Internal Medicine

## 2018-05-25 ENCOUNTER — Ambulatory Visit (INDEPENDENT_AMBULATORY_CARE_PROVIDER_SITE_OTHER): Payer: PPO | Admitting: Internal Medicine

## 2018-05-25 DIAGNOSIS — E7800 Pure hypercholesterolemia, unspecified: Secondary | ICD-10-CM

## 2018-05-25 DIAGNOSIS — D75839 Thrombocytosis, unspecified: Secondary | ICD-10-CM

## 2018-05-25 DIAGNOSIS — F439 Reaction to severe stress, unspecified: Secondary | ICD-10-CM | POA: Diagnosis not present

## 2018-05-25 DIAGNOSIS — Z720 Tobacco use: Secondary | ICD-10-CM | POA: Diagnosis not present

## 2018-05-25 DIAGNOSIS — D473 Essential (hemorrhagic) thrombocythemia: Secondary | ICD-10-CM | POA: Diagnosis not present

## 2018-05-25 MED ORDER — ATORVASTATIN CALCIUM 20 MG PO TABS: ORAL_TABLET | ORAL | 1 refills | Status: DC

## 2018-05-25 NOTE — Progress Notes (Signed)
[name], female   DOB: 10-Apr-1946, 72 y.o.   MRN: [id number]   [name], female    DOB: Mar 16, 1946, 72 y.o.   MRN: [id number]  HPI  Patient here for a scheduled follow up.  Saw hematology recently for elevated platelet count.  Last platelet count wnl.  Also saw Dr Monica Morales.  States had stress test and everything ok.  She reports she is doing well.  Feels good.  No chest pain.  Stays active.  No sob.  No acid reflux.  No abdominal pain or cramping.  Bowels moving.     Past Medical History:  Diagnosis Date  . Chronic bronchitis ([place])   . Hypercholesterolemia   . Migraines    Past Surgical History:  Procedure Laterality Date  . ABDOMINAL HYSTERECTOMY  1979   secondary to fibroids, incidental appendectomy  . BREAST BIOPSY  1979   benign  . TONSILLECTOMY  1960   Family History  Problem Relation Age of Onset  . Heart disease Father        myocardial infarction - died age 48  . Heart disease Mother   . Diabetes Mother   . Diabetes Maternal Grandmother   . Breast cancer Neg Hx   . Colon cancer Neg Hx    Social History   Socioeconomic History  . Marital status: Married    Spouse name: Not on file  . Number of children: 1  . Years of education: Not on file  . Highest education level: Not on file  Occupational History  . Not on file  Social Needs  . Financial resource strain: Not on file  . Food insecurity:    Worry: Not on file    Inability: Not on file  . Transportation needs:    Medical: Not on file    Non-medical: Not on file  Tobacco Use  . Smoking status: Current Every Day Smoker    Packs/day: 1.00    Years: 50.00    Pack years: 50.00    Types: Cigarettes  . Smokeless tobacco: Never Used  . Tobacco comment: is going to try electronic cigarettes  Substance and Sexual Activity  . Alcohol use: No    Alcohol/week: 0.0 standard drinks  . Drug use: No  . Sexual activity: Not Currently  Lifestyle  . Physical  activity:    Days per week: Not on file    Minutes per session: Not on file  . Stress: Not on file  Relationships  . Social connections:    Talks on phone: Not on file    Gets together: Not on file    Attends religious service: Not on file    Active member of club or organization: Not on file    Attends meetings of clubs or organizations: Not on file    Relationship status: Not on file  Other Topics Concern  . Not on file  Social History Narrative  . Not on file    Outpatient Encounter Medications as of 05/25/2018  Medication Sig  . atorvastatin (LIPITOR) 20 MG tablet TAKE ONE (1) TABLET BY MOUTH EVERY DAY  . Ibuprofen (ADVIL) 200 MG CAPS Take by mouth.  . [DISCONTINUED] atorvastatin (LIPITOR) 20 MG tablet TAKE ONE (1) TABLET BY MOUTH EVERY DAY   No facility-administered encounter medications on file as of 05/25/2018.     Review of Systems  Constitutional: Negative for appetite change and unexpected weight change.  HENT: Negative for congestion  and sinus pressure.   Respiratory: Negative for cough, chest tightness and shortness of breath.   Cardiovascular: Negative for chest pain, palpitations and leg swelling.  Gastrointestinal: Negative for abdominal pain, diarrhea, nausea and vomiting.  Genitourinary: Negative for difficulty urinating and dysuria.  Musculoskeletal: Negative for joint swelling and myalgias.  Skin: Negative for color change and rash.  Neurological: Negative for dizziness, light-headedness and headaches.  Psychiatric/Behavioral: Negative for agitation and dysphoric mood.       Objective:    Physical Exam  Constitutional: She appears well-developed and well-nourished. No distress.  HENT:  Nose: Nose normal.  Mouth/Throat: Oropharynx is clear and moist.  Neck: Neck supple. No thyromegaly present.  Cardiovascular: Normal rate and regular rhythm.  Pulmonary/Chest: Breath sounds normal. No respiratory distress. She has no wheezes.  Abdominal: Soft. Bowel  sounds are normal. There is no tenderness.  Musculoskeletal: She exhibits no edema or tenderness.  Lymphadenopathy:    She has no cervical adenopathy.  Skin: No rash noted. No erythema.  Psychiatric: She has a normal mood and affect. Her behavior is normal.    BP 124/72 (BP Location: Left Arm, Patient Position: Sitting, Cuff Size: Normal)   Pulse 70   Temp 98.1 F (36.7 C) (Oral)   Resp 18   Wt 144 lb 3.2 oz (65.4 kg)   SpO2 98%   BMI 24.75 kg/m  Wt Readings from Last 3 Encounters:  05/25/18 144 lb 3.2 oz (65.4 kg)  05/22/18 145 lb 1 oz (65.8 kg)  04/17/18 144 lb 8 oz (65.5 kg)     Lab Results  Component Value Date   WBC 8.9 05/22/2018   HGB 13.2 05/22/2018   HCT 39.7 05/22/2018   PLT 376 05/22/2018   GLUCOSE 90 02/13/2018   CHOL 170 10/31/2017   TRIG 183.0 (H) 10/31/2017   HDL 55.30 10/31/2017   LDLDIRECT 81.0 09/29/2016   LDLCALC 78 10/31/2017   ALT 14 02/13/2018   AST 15 02/13/2018   NA 140 02/13/2018   K 4.7 02/13/2018   CL 104 02/13/2018   CREATININE 0.78 02/13/2018   BUN 13 02/13/2018   CO2 28 02/13/2018   TSH 2.27 02/13/2018   MICROALBUR 0.7 01/24/2017    Nm Myocar Multi W/spect W/wall Motion / Ef  Result Date: 04/26/2018 Exercise myocardial perfusion imaging study with no significant  ischemia Normal wall motion, EF estimated at 54% No EKG changes concerning for ischemia at peak stress or in recovery. Target heart rate achieved 176 bpm 4.6 METS Low risk scan Signed, Esmond Plants, MD, Ph.D [facility]       Assessment & Plan:   Problem List Items Addressed This Visit    Hypercholesterolemia    On lipitor.  Follow lipid panel and liver function tests.        Relevant Medications   atorvastatin (LIPITOR) 20 MG tablet   Other Relevant Orders   Hepatic function panel   Lipid panel   Basic metabolic panel   Stress    Overall handling things well. Does not feel she needs any further intervention.  Follow.        Thrombocytosis ([place])    Worked  up by hematology.  Platelet count normalized.  Follow.        Tobacco abuse    Discussed the need to stop smoking.  She declines to quit.  Discussed screening CT.  She declines.  Will notify me if she changes her mind.  Einar Pheasant, MD

## 2018-05-28 ENCOUNTER — Encounter: Payer: Self-pay | Admitting: Internal Medicine

## 2018-05-28 NOTE — Assessment & Plan Note (Signed)
On lipitor.  Follow lipid panel and liver function tests.   

## 2018-05-28 NOTE — Assessment & Plan Note (Signed)
Worked up by hematology.  Platelet count normalized.  Follow.

## 2018-05-28 NOTE — Assessment & Plan Note (Signed)
Discussed the need to stop smoking.  She declines to quit.  Discussed screening CT.  She declines.  Will notify me if she changes her mind.

## 2018-05-28 NOTE — Assessment & Plan Note (Signed)
Overall handling things well. Does not feel she needs any further intervention.  Follow.

## 2018-06-21 DIAGNOSIS — Z1231 Encounter for screening mammogram for malignant neoplasm of breast: Secondary | ICD-10-CM | POA: Diagnosis not present

## 2018-06-21 LAB — HM MAMMOGRAPHY

## 2018-07-26 ENCOUNTER — Other Ambulatory Visit (INDEPENDENT_AMBULATORY_CARE_PROVIDER_SITE_OTHER): Payer: PPO

## 2018-07-26 DIAGNOSIS — E7800 Pure hypercholesterolemia, unspecified: Secondary | ICD-10-CM | POA: Diagnosis not present

## 2018-07-26 LAB — HEPATIC FUNCTION PANEL
ALBUMIN: 4.4 g/dL (ref 3.5–5.2)
ALK PHOS: 84 U/L (ref 39–117)
ALT: 15 U/L (ref 0–35)
AST: 16 U/L (ref 0–37)
Bilirubin, Direct: 0.1 mg/dL (ref 0.0–0.3)
Total Bilirubin: 0.5 mg/dL (ref 0.2–1.2)
Total Protein: 7.1 g/dL (ref 6.0–8.3)

## 2018-07-26 LAB — BASIC METABOLIC PANEL
BUN: 14 mg/dL (ref 6–23)
CALCIUM: 9.8 mg/dL (ref 8.4–10.5)
CO2: 29 mEq/L (ref 19–32)
Chloride: 103 mEq/L (ref 96–112)
Creatinine, Ser: 0.65 mg/dL (ref 0.40–1.20)
GFR: 94.59 mL/min (ref >60.00)
Glucose, Bld: 94 mg/dL (ref 70–99)
POTASSIUM: 4.1 meq/L (ref 3.5–5.1)
SODIUM: 139 meq/L (ref 135–145)

## 2018-07-26 LAB — LIPID PANEL
CHOLESTEROL: 187 mg/dL (ref 0–200)
HDL: 70.6 mg/dL (ref >39.00)
LDL Cholesterol: 80 mg/dL (ref 0–99)
NonHDL: 115.92
TRIGLYCERIDES: 181 mg/dL — AB (ref 0.0–149.0)
Total CHOL/HDL Ratio: 3
VLDL: 36.2 mg/dL (ref 0.0–40.0)

## 2018-08-31 ENCOUNTER — Encounter: Payer: Self-pay | Admitting: Internal Medicine

## 2018-08-31 ENCOUNTER — Ambulatory Visit (INDEPENDENT_AMBULATORY_CARE_PROVIDER_SITE_OTHER): Payer: PPO | Admitting: Internal Medicine

## 2018-08-31 VITALS — BP 115/80 | HR 82 | Temp 98.2°F | Wt 146.4 lb

## 2018-08-31 DIAGNOSIS — R55 Syncope and collapse: Secondary | ICD-10-CM

## 2018-08-31 DIAGNOSIS — D473 Essential (hemorrhagic) thrombocythemia: Secondary | ICD-10-CM | POA: Diagnosis not present

## 2018-08-31 DIAGNOSIS — Z124 Encounter for screening for malignant neoplasm of cervix: Secondary | ICD-10-CM | POA: Diagnosis not present

## 2018-08-31 DIAGNOSIS — Z720 Tobacco use: Secondary | ICD-10-CM

## 2018-08-31 DIAGNOSIS — E7800 Pure hypercholesterolemia, unspecified: Secondary | ICD-10-CM | POA: Diagnosis not present

## 2018-08-31 DIAGNOSIS — D75839 Thrombocytosis, unspecified: Secondary | ICD-10-CM

## 2018-08-31 NOTE — Progress Notes (Signed)
Patient ID: CARYSSA ELZEY, female   DOB: 04/10/1946, 73 y.o.   MRN: [id number]   Subjective:    Patient ID: [name], female    DOB: 08-17-1945, 73 y.o.   MRN: [id number]  HPI  Patient here for a scheduled follow up.  She reports she is doing well.  Feels good.  Stays active.  No chest pain.  Breathing stable.  Still smoking.  Discussed the need to quit.  She declines.  No abdominal pain.  Bowels moving.  No urine change.  Overall feels she is doing well.     Past Medical History:  Diagnosis Date  . Chronic bronchitis ([place])   . Hypercholesterolemia   . Migraines    Past Surgical History:  Procedure Laterality Date  . ABDOMINAL HYSTERECTOMY  1979   secondary to fibroids, incidental appendectomy  . BREAST BIOPSY  1979   benign  . TONSILLECTOMY  1960   Family History  Problem Relation Age of Onset  . Heart disease Father        myocardial infarction - died age 69  . Heart disease Mother   . Diabetes Mother   . Diabetes Maternal Grandmother   . Breast cancer Neg Hx   . Colon cancer Neg Hx    Social History   Socioeconomic History  . Marital status: Married    Spouse name: Not on file  . Number of children: 1  . Years of education: Not on file  . Highest education level: Not on file  Occupational History  . Not on file  Social Needs  . Financial resource strain: Not on file  . Food insecurity:    Worry: Not on file    Inability: Not on file  . Transportation needs:    Medical: Not on file    Non-medical: Not on file  Tobacco Use  . Smoking status: Current Every Day Smoker    Packs/day: 1.00    Years: 50.00    Pack years: 50.00    Types: Cigarettes  . Smokeless tobacco: Never Used  . Tobacco comment: is going to try electronic cigarettes  Substance and Sexual Activity  . Alcohol use: No    Alcohol/week: 0.0 standard drinks  . Drug use: No  . Sexual activity: Not Currently  Lifestyle  . Physical activity:    Days per week: Not on file    Minutes  per session: Not on file  . Stress: Not on file  Relationships  . Social connections:    Talks on phone: Not on file    Gets together: Not on file    Attends religious service: Not on file    Active member of club or organization: Not on file    Attends meetings of clubs or organizations: Not on file    Relationship status: Not on file  Other Topics Concern  . Not on file  Social History Narrative  . Not on file    Outpatient Encounter Medications as of 08/31/2018  Medication Sig  . atorvastatin (LIPITOR) 20 MG tablet TAKE ONE (1) TABLET BY MOUTH EVERY DAY  . Ibuprofen (ADVIL) 200 MG CAPS Take by mouth.   No facility-administered encounter medications on file as of 08/31/2018.    Review of Systems  Constitutional: Negative for appetite change and unexpected weight change.  HENT: Negative for congestion and sinus pressure.   Respiratory: Negative for cough, chest tightness and shortness of breath.   Cardiovascular: Negative for chest pain, palpitations and leg swelling.  Gastrointestinal: Negative for abdominal pain, diarrhea, nausea and vomiting.  Genitourinary: Negative for difficulty urinating and dysuria.  Musculoskeletal: Negative for joint swelling and myalgias.  Skin: Negative for color change and rash.  Neurological: Negative for dizziness, light-headedness and headaches.  Psychiatric/Behavioral: Negative for agitation and dysphoric mood.       Objective:    Physical Exam Constitutional:      General: She is not in acute distress.    Appearance: Normal appearance.  HENT:     Nose: Nose normal. No congestion.     Mouth/Throat:     Pharynx: No oropharyngeal exudate or posterior oropharyngeal erythema.  Neck:     Musculoskeletal: Neck supple. No muscular tenderness.     Thyroid: No thyromegaly.  Cardiovascular:     Rate and Rhythm: Normal rate and regular rhythm.  Pulmonary:     Effort: No respiratory distress.     Breath sounds: Normal breath sounds. No  wheezing.  Abdominal:     General: Bowel sounds are normal.     Palpations: Abdomen is soft.     Tenderness: There is no abdominal tenderness.  Musculoskeletal:        General: No swelling or tenderness.  Lymphadenopathy:     Cervical: No cervical adenopathy.  Skin:    Findings: No erythema or rash.  Neurological:     Mental Status: She is alert.  Psychiatric:        Mood and Affect: Mood normal.        Behavior: Behavior normal.     BP 115/80   Pulse 82   Temp 98.2 F (36.8 C) (Oral)   Wt 146 lb 6.4 oz (66.4 kg)   SpO2 97%   BMI 25.13 kg/m  Wt Readings from Last 3 Encounters:  08/31/18 146 lb 6.4 oz (66.4 kg)  05/25/18 144 lb 3.2 oz (65.4 kg)  05/22/18 145 lb 1 oz (65.8 kg)     Lab Results  Component Value Date   WBC 8.9 05/22/2018   HGB 13.2 05/22/2018   HCT 39.7 05/22/2018   PLT 376 05/22/2018   GLUCOSE 94 07/26/2018   CHOL 187 07/26/2018   TRIG 181.0 (H) 07/26/2018   HDL 70.60 07/26/2018   LDLDIRECT 81.0 09/29/2016   LDLCALC 80 07/26/2018   ALT 15 07/26/2018   AST 16 07/26/2018   NA 139 07/26/2018   K 4.1 07/26/2018   CL 103 07/26/2018   CREATININE 0.65 07/26/2018   BUN 14 07/26/2018   CO2 29 07/26/2018   TSH 2.27 02/13/2018   MICROALBUR 0.7 01/24/2017    Nm Myocar Multi W/spect W/wall Motion / Ef  Result Date: 04/26/2018 Exercise myocardial perfusion imaging study with no significant  ischemia Normal wall motion, EF estimated at 54% No EKG changes concerning for ischemia at peak stress or in recovery. Target heart rate achieved 176 bpm 4.6 METS Low risk scan Signed, Esmond Plants, MD, Ph.D [facility]       Assessment & Plan:   Problem List Items Addressed This Visit    Hypercholesterolemia    On lipitor.  Low cholesterol diet and exercise.  Follow lipid panel and liver function tests.        Relevant Orders   TSH   Lipid panel   Hepatic function panel   Basic metabolic panel   Syncope    Worked up by cardiology.  Has had no recurring  episodes.  Follow.        Thrombocytosis ([place])    Worked up  by hematology.  Platelet count wnl last check.        Tobacco abuse    Discussed the need to quit smoking.  She declines to quit.  Follow.         Other Visit Diagnoses    Cervical cancer screening    -  Primary       Einar Pheasant, MD

## 2018-09-03 ENCOUNTER — Encounter: Payer: Self-pay | Admitting: Internal Medicine

## 2018-09-03 NOTE — Assessment & Plan Note (Signed)
Worked up by cardiology.  Has had no recurring episodes.  Follow.

## 2018-09-03 NOTE — Assessment & Plan Note (Signed)
Discussed the need to quit smoking.  She declines to quit.  Follow.   

## 2018-09-03 NOTE — Assessment & Plan Note (Signed)
On lipitor.  Low cholesterol diet and exercise.  Follow lipid panel and liver function tests.   

## 2018-09-03 NOTE — Assessment & Plan Note (Signed)
Worked up by hematology.  Platelet count wnl last check.

## 2018-09-27 ENCOUNTER — Other Ambulatory Visit: Payer: Self-pay

## 2018-09-27 ENCOUNTER — Ambulatory Visit: Payer: Self-pay | Admitting: *Deleted

## 2018-09-27 MED ORDER — OSELTAMIVIR PHOSPHATE 75 MG PO CAPS: 75.0000 mg | ORAL_CAPSULE | Freq: Two times a day (BID) | ORAL | 0 refills | Status: DC

## 2018-09-27 NOTE — Telephone Encounter (Signed)
Message from Carolyn Stare sent at 09/27/2018 8:03 AM EDT    Pt was expose to the flu and is now having symptons cough , body aches   Returned call to patient and spoke with husband (she is right there). Pt was exposed to the flu on Sunday from her grand son. She started feeling bad yesterday afternoon.  She feels feverish, has not checked her temperature. She has body aches, head ache and a cough. She denies respiratory distress. She is a smoker.  Home care advice given to her to try ibuprofen for her body aches, but check temperature first so not to mask a fever, drink plenty of fluids (water), hot tea with lemon and honey, use a humidifier and a cough medication (which she has but could not think of the name) . He voiced understanding. First available appointment scheduled for Thursday. Advised to call 911 if symptoms become worst including respiratory distress. He voiced understanding. Routing and calling [employer].  Reason for Disposition . Patient is HIGH RISK (e.g., age > 46 years, pregnant, HIV+, or chronic medical condition)    Pt does not have a chronic medical condition  Answer Assessment - Initial Assessment Questions 1. WORST SYMPTOM: "What is your worst symptom?" (e.g., cough, runny nose, muscle aches, headache, sore throat, fever)      Body aches 2. ONSET: "When did your flu symptoms start?"      Yesterday afternoon 3. COUGH: "How bad is the cough?"       bad 4. RESPIRATORY DISTRESS: "Describe your breathing."      no 5. FEVER: "Do you have a fever?" If so, ask: "What is your temperature, how was it measured, and when did it start?"     Think so, feel feverish 6. EXPOSURE: "Were you exposed to someone with influenza?"       Yes exposed to grand son 7. FLU VACCINE: "Did you get a flu shot this year?"     no 8. HIGH RISK DISEASE: "Do you any chronic medical problems?" (e.g., heart or lung disease, asthma, weak immune system, or other HIGH RISK conditions)  no 9. PREGNANCY: "Is there any chance you are pregnant?" "When was your last menstrual period?"     n/a 10. OTHER SYMPTOMS: "Do you have any other symptoms?"  (e.g., runny nose, muscle aches, headache, sore throat)       Muscle ache, headache, runny nose  Protocols used: INFLUENZA - SEASONAL-A-AH

## 2018-09-27 NOTE — Telephone Encounter (Signed)
Pt was exposed to the flu this weekend. Having chills, cough, body aches, possible fever since yesterday afternoon.

## 2018-09-27 NOTE — Addendum Note (Signed)
Addended by: Crecencio Mc on: 09/27/2018 10:18 AM   Modules accepted: Orders

## 2018-09-27 NOTE — Telephone Encounter (Signed)
tamiflu twice daily x 5 days ,  rx sent to [facility]

## 2018-09-27 NOTE — Telephone Encounter (Signed)
Patient is aware. Cancelled appt for tomorrow

## 2018-09-28 ENCOUNTER — Ambulatory Visit: Payer: PPO | Admitting: Family Medicine

## 2018-10-13 DIAGNOSIS — L570 Actinic keratosis: Secondary | ICD-10-CM | POA: Diagnosis not present

## 2018-10-13 DIAGNOSIS — D485 Neoplasm of uncertain behavior of skin: Secondary | ICD-10-CM | POA: Diagnosis not present

## 2018-10-13 DIAGNOSIS — Z85828 Personal history of other malignant neoplasm of skin: Secondary | ICD-10-CM | POA: Diagnosis not present

## 2018-10-13 DIAGNOSIS — L578 Other skin changes due to chronic exposure to nonionizing radiation: Secondary | ICD-10-CM | POA: Diagnosis not present

## 2018-11-03 ENCOUNTER — Encounter: Payer: Self-pay | Admitting: Internal Medicine

## 2018-11-03 ENCOUNTER — Other Ambulatory Visit: Payer: Self-pay

## 2018-11-03 ENCOUNTER — Ambulatory Visit (INDEPENDENT_AMBULATORY_CARE_PROVIDER_SITE_OTHER): Payer: PPO | Admitting: Internal Medicine

## 2018-11-03 DIAGNOSIS — F439 Reaction to severe stress, unspecified: Secondary | ICD-10-CM

## 2018-11-03 DIAGNOSIS — Z716 Tobacco abuse counseling: Secondary | ICD-10-CM

## 2018-11-03 DIAGNOSIS — D75839 Thrombocytosis, unspecified: Secondary | ICD-10-CM

## 2018-11-03 DIAGNOSIS — E7800 Pure hypercholesterolemia, unspecified: Secondary | ICD-10-CM

## 2018-11-03 DIAGNOSIS — D473 Essential (hemorrhagic) thrombocythemia: Secondary | ICD-10-CM | POA: Diagnosis not present

## 2018-11-03 MED ORDER — ATORVASTATIN CALCIUM 20 MG PO TABS: ORAL_TABLET | ORAL | 1 refills | Status: DC

## 2018-11-03 NOTE — Assessment & Plan Note (Signed)
Discussed the need to stop smoking.  She has decreased the amount she is smoking.  Desires not to quit at this time.

## 2018-11-03 NOTE — Assessment & Plan Note (Signed)
Worked up by hematology.  Last check wnl.  Follow cbc.

## 2018-11-03 NOTE — Progress Notes (Signed)
[name], female   DOB: [birth year], 73 y.o.   MRN: [id number] Virtual Visit via Video: Note  This visit type was conducted due to national recommendations for restrictions regarding the COVID-19 pandemic (e.g. social distancing).  This format is felt to be most appropriate for this patient at this time.  All issues noted in this document were discussed and addressed.  No physical exam was performed (except for noted visual exam findings with Video Visits).   I connected with [name] on 11/03/18 at  8:30 AM EDT by a video enabled telemedicine application.  Verified that I am speaking with the correct person using two identifiers. Location patient: home Location provider: work  Persons participating in the virtual visit: patient, provider  I discussed the limitations, risks, security and privacy concerns of performing an evaluation and management service by video.  The patient expressed understanding and agreed to proceed.   Reason for visit: scheduled follow up.   HPI: States she is doing well.  Recently treated for flu.  Son tested positive for influenza A.  Her and her husband then developed similar symptoms.  Treated with tamiflu.  States symptoms have resolved now.  No cough or congestion.  No sob.  No fever.  No known COVID exposures.  Staying in.  Handling stress.  Staying busy and active at home.  No chest pain.  No acid reflux reported.  No abdominal pain.  Bowls moving.  Taking lipitor.  Discussed the need to stop smoking.     ROS: See pertinent positives and negatives per HPI.  Past Medical History:  Diagnosis Date  . Chronic bronchitis ([place])   . Hypercholesterolemia   . Migraines     Past Surgical History:  Procedure Laterality Date  . ABDOMINAL HYSTERECTOMY  1979   secondary to fibroids, incidental appendectomy  . BREAST BIOPSY  1979   benign  . TONSILLECTOMY  1960    Family History  Problem Relation Age of Onset  . Heart disease Father        myocardial infarction - died age 65  . Heart disease Mother   . Diabetes Mother   . Diabetes Maternal Grandmother   . Breast cancer Neg Hx   . Colon cancer Neg Hx     SOCIAL HX: reviewed.    Current Outpatient Medications:  .  atorvastatin (LIPITOR) 20 MG tablet, TAKE ONE (1) TABLET BY MOUTH EVERY DAY, Disp: 90 tablet, Rfl: 1 .  Ibuprofen (ADVIL) 200 MG CAPS, Take by mouth., Disp: , Rfl:   EXAM:  GENERAL: alert, oriented, appears well and in no acute distress  HEENT: atraumatic, conjunttiva clear, no obvious abnormalities on inspection of external nose and ears  NECK: normal movements of the head and neck  LUNGS: on inspection no signs of respiratory distress, breathing rate appears normal, no obvious gross SOB, gasping or wheezing  CV: no obvious cyanosis  PSYCH/NEURO: pleasant and cooperative, no obvious depression or anxiety, speech and thought processing grossly intact  ASSESSMENT AND PLAN:  Discussed the following assessment and plan:  Hypercholesterolemia  Stress  Thrombocytosis ([place]) - Plan: CBC with Differential/Platelet  Tobacco abuse counseling  Hypercholesterolemia On lipitor.  Low cholesterol diet and exercise.  Follow lipid panel and liver function tests.    Stress She feels she is handling things well.  Does not feel needs any further intervention.  Follow.    Thrombocytosis ([place]) Worked up by hematology.  Last check wnl.  Follow cbc.    Tobacco  abuse counseling Discussed the need to stop smoking.  She has decreased the amount she is smoking.  Desires not to quit at this time.      I discussed the assessment and treatment plan with the patient. The patient was provided an opportunity to ask questions and all were answered. The patient agreed with the plan and demonstrated an understanding of the instructions.   The patient was advised to call back or seek an in-person evaluation if the symptoms worsen or if the condition fails to improve as  anticipated.   Einar Pheasant, MD

## 2018-11-03 NOTE — Assessment & Plan Note (Signed)
She feels she is handling things well.  Does not feel needs any further intervention.  Follow.

## 2018-11-03 NOTE — Assessment & Plan Note (Signed)
On lipitor.  Low cholesterol diet and exercise.  Follow lipid panel and liver function tests.   

## 2018-12-26 ENCOUNTER — Other Ambulatory Visit: Payer: Self-pay

## 2018-12-26 ENCOUNTER — Ambulatory Visit (INDEPENDENT_AMBULATORY_CARE_PROVIDER_SITE_OTHER): Payer: PPO

## 2018-12-26 DIAGNOSIS — Z0000 Encounter for general adult medical examination without abnormal findings: Secondary | ICD-10-CM | POA: Diagnosis not present

## 2018-12-26 NOTE — Patient Instructions (Addendum)
  Monica Morales , Thank you for taking time to come for your Medicare Wellness Visit. I appreciate your ongoing commitment to your health goals. Please review the following plan we discussed and let me know if I can assist you in the future.   These are the goals we discussed: Goals      Patient Stated   . Follow up with Primary Care Provider (pt-stated)     As needed       This is a list of the screening recommended for you and due dates:  Health Maintenance  Topic Date Due  .  Hepatitis C: One time screening is recommended by Center for Disease Control  (CDC) for  adults born from [birth year] through 1967.   1946-01-11  . Tetanus Vaccine  02/12/1963  . Mammogram  06/21/2020  . Colon Cancer Screening  12/20/2022  . DEXA scan (bone density measurement)  Completed  . Flu Shot  Discontinued  . Pneumonia vaccines  Discontinued

## 2018-12-26 NOTE — Progress Notes (Signed)
Subjective:   Monica Morales is a 73 y.o. female who presents for Medicare Annual (Subsequent) preventive examination.  Review of Systems:  No ROS.  Medicare Wellness Virtual Visit.  Visual/audio telehealth visit, UTA vital signs.   See social history for additional risk factors.  Cardiac Risk Factors include: advanced age (>71men, >15 women);smoking/ tobacco exposure     Objective:     Vitals: There were no vitals taken for this visit.  There is no height or weight on file to calculate BMI.  Advanced Directives 12/26/2018 05/22/2018 11/23/2017 11/07/2017 08/30/2016 08/27/2015  Does Patient Have a Medical Advance Directive? Yes Yes Yes Yes Yes Yes  Type of Advance Directive Living will [place];Living will [place];Living will Living will;Healthcare Power of [place];Living will [place];Living will  Does patient want to make changes to medical advance directive? No - Patient declined - - - No - Patient declined No - Patient declined  Copy of [place] in Chart? - - - No - copy requested No - copy requested No - copy requested    Tobacco Social History   Tobacco Use  Smoking Status Current Every Day Smoker  . Packs/day: 1.00  . Years: 50.00  . Pack years: 50.00  . Types: Cigarettes  Smokeless Tobacco Never Used  Tobacco Comment   is going to try electronic cigarettes     Ready to quit: Not Answered Counseling given: Not Answered Comment: is going to try electronic cigarettes   Clinical Intake:  Pre-visit preparation completed: Yes        Diabetes: No  How often do you need to have someone help you when you read instructions, pamphlets, or other written materials from your doctor or pharmacy?: 1 - Never  Interpreter Needed?: No     Past Medical History:  Diagnosis Date  . Chronic bronchitis ([place])   . Hypercholesterolemia   . Migraines    Past Surgical  History:  Procedure Laterality Date  . ABDOMINAL HYSTERECTOMY  1979   secondary to fibroids, incidental appendectomy  . BREAST BIOPSY  1979   benign  . TONSILLECTOMY  1960   Family History  Problem Relation Age of Onset  . Heart disease Father        myocardial infarction - died age 86  . Heart disease Mother   . Diabetes Mother   . Diabetes Maternal Grandmother   . Breast cancer Neg Hx   . Colon cancer Neg Hx    Social History   Socioeconomic History  . Marital status: Married    Spouse name: Not on file  . Number of children: 1  . Years of education: Not on file  . Highest education level: Not on file  Occupational History  . Not on file  Social Needs  . Financial resource strain: Not hard at all  . Food insecurity:    Worry: Never true    Inability: Never true  . Transportation needs:    Medical: No    Non-medical: No  Tobacco Use  . Smoking status: Current Every Day Smoker    Packs/day: 1.00    Years: 50.00    Pack years: 50.00    Types: Cigarettes  . Smokeless tobacco: Never Used  . Tobacco comment: is going to try electronic cigarettes  Substance and Sexual Activity  . Alcohol use: No    Alcohol/week: 0.0 standard drinks  . Drug use: No  .  Sexual activity: Not Currently  Lifestyle  . Physical activity:    Days per week: Not on file    Minutes per session: Not on file  . Stress: Not at all  Relationships  . Social connections:    Talks on phone: Not on file    Gets together: Not on file    Attends religious service: Not on file    Active member of club or organization: Not on file    Attends meetings of clubs or organizations: Not on file    Relationship status: Not on file  Other Topics Concern  . Not on file  Social History Narrative  . Not on file    Outpatient Encounter Medications as of 12/26/2018  Medication Sig  . atorvastatin (LIPITOR) 20 MG tablet TAKE ONE (1) TABLET BY MOUTH EVERY DAY  . Ibuprofen (ADVIL) 200 MG CAPS Take by mouth.    No facility-administered encounter medications on file as of 12/26/2018.     Activities of Daily Living In your present state of health, do you have any difficulty performing the following activities: 12/26/2018  Hearing? N  Vision? N  Difficulty concentrating or making decisions? N  Walking or climbing stairs? N  Dressing or bathing? N  Doing errands, shopping? N  Preparing Food and eating ? N  Using the Toilet? N  In the past six months, have you accidently leaked urine? N  Do you have problems with loss of bowel control? N  Managing your Medications? N  Managing your Finances? N  Housekeeping or managing your Housekeeping? N  Some recent data might be hidden    Patient Care Team: Einar Pheasant, MD as PCP - General (Internal Medicine)    Assessment:   This is a routine wellness examination for Monica Morales.  I connected with patient 12/26/18 at  9:30 AM EDT by a video/audio enabled telemedicine application and verified that I am speaking with the correct person using two identifiers. Patient stated full name and DOB. Patient gave permission to continue with virtual visit. Patient's location was at home and Nurse's location was at [place] office.   Health Screenings  Mammogram - 06/2018 Colonoscopy - 12/2012 Glaucoma -none Hearing -demonstrates normal hearing during visit. Cholesterol - 07/2018 Dental- UTD Vision- UTD  Social  Alcohol intake - no          Smoking history- current, not ready to quit Illicit drug use? none Physical activity- active around the home Diet -regular Sexually Active -not currently BMI- discussed the importance of a healthy diet, water intake and the benefits of aerobic exercise.  Educational material provided.    Safety  Patient feels safe at home- yes Patient does have smoke detectors at home- yes Patient does wear sunscreen or protective clothing when in direct sunlight -yes Patient does wear seat belt when in a moving vehicle -yes  Covid-19  precautions and sickness symptoms discussed.   Activities of Daily Living Patient denies needing assistance with: driving, household chores, feeding themselves, getting from bed to chair, getting to the toilet, bathing/showering, dressing, managing money, or preparing meals.  No new identified risk were noted.    Depression Screen Patient denies losing interest in daily life, feeling hopeless, or crying easily over simple problems.   Medication-taking as directed and without issues.   Fall Screen Patient denies being afraid of falling or falling in the last year.   Memory Screen Patient is alert.  Patient denies difficulty focusing, concentrating or misplacing items. Correctly identified the president  of the [place], season and recall. Patient likes to quilt, cross stitch, read, play computer games, and completes puzzles for brain stimulation.  Immunizations The following Immunizations were discussed: Influenza, shingles, pneumonia, and tetanus.   Other Providers Patient Care Team: Einar Pheasant, MD as PCP - General (Internal Medicine)  Exercise Activities and Dietary recommendations    Goals      Patient Stated   . Follow up with Primary Care Provider (pt-stated)     As needed       Fall Risk Fall Risk  12/26/2018 08/30/2016 05/10/2016 04/05/2016 08/27/2015  Falls in the past year? 0 No No No No   Depression Screen PHQ 2/9 Scores 12/26/2018 08/30/2016 05/10/2016 04/05/2016  PHQ - 2 Score 0 0 0 0     Cognitive Function MMSE - Mini Mental State Exam 08/27/2015  Orientation to time 5  Orientation to Place 5  Registration 3  Attention/ Calculation 5  Recall 3  Language- name 2 objects 2  Language- repeat 1  Language- follow 3 step command 3  Language- read & follow direction 1  Write a sentence 1  Copy design 1  Total score 30     6CIT Screen 12/26/2018 08/30/2016  What Year? 0 points 0 points  What month? 0 points 0 points  What time? 0 points 0 points  Count back  from 20 0 points 0 points  Months in reverse 0 points 0 points  Repeat phrase 0 points 0 points  Total Score 0 0     There is no immunization history on file for this patient.  Screening Tests Health Maintenance  Topic Date Due  . Hepatitis C Screening  08-22-1945  . TETANUS/TDAP  02/12/1963  . MAMMOGRAM  06/21/2020  . COLONOSCOPY  12/20/2022  . DEXA SCAN  Completed  . INFLUENZA VACCINE  Discontinued  . PNA vac Low Risk Adult  Discontinued       Plan:    End of life planning; Advance aging; Advanced directives discussed.  Copy of current HCPOA/Living Will requested.    I have personally reviewed and noted the following in the patient's chart:   . Medical and social history . Use of alcohol, tobacco or illicit drugs  . Current medications and supplements . Functional ability and status . Nutritional status . Physical activity . Advanced directives . List of other physicians . Hospitalizations, surgeries, and ER visits in previous 12 months . Vitals . Screenings to include cognitive, depression, and falls . Referrals and appointments  In addition, I have reviewed and discussed with patient certain preventive protocols, quality metrics, and best practice recommendations. A written personalized care plan for preventive services as well as general preventive health recommendations were provided to patient.     Varney Biles, LPN  08/21/4495   Reviewed above information.  Agree with assessment and plan.    Dr Nicki Reaper

## 2019-01-15 ENCOUNTER — Other Ambulatory Visit (INDEPENDENT_AMBULATORY_CARE_PROVIDER_SITE_OTHER): Payer: PPO

## 2019-01-15 ENCOUNTER — Other Ambulatory Visit: Payer: Self-pay

## 2019-01-15 DIAGNOSIS — D473 Essential (hemorrhagic) thrombocythemia: Secondary | ICD-10-CM

## 2019-01-15 DIAGNOSIS — E7800 Pure hypercholesterolemia, unspecified: Secondary | ICD-10-CM

## 2019-01-15 DIAGNOSIS — D75839 Thrombocytosis, unspecified: Secondary | ICD-10-CM

## 2019-01-15 LAB — BASIC METABOLIC PANEL
BUN: 14 mg/dL (ref 6–23)
CO2: 26 mEq/L (ref 19–32)
Calcium: 9.3 mg/dL (ref 8.4–10.5)
Chloride: 104 mEq/L (ref 96–112)
Creatinine, Ser: 0.71 mg/dL (ref 0.40–1.20)
GFR: 80.27 mL/min (ref >60.00)
Glucose, Bld: 91 mg/dL (ref 70–99)
Potassium: 4.1 mEq/L (ref 3.5–5.1)
Sodium: 139 mEq/L (ref 135–145)

## 2019-01-15 LAB — CBC WITH DIFFERENTIAL/PLATELET
Basophils Absolute: 0.1 10*3/uL (ref 0.0–0.1)
Basophils Relative: 0.8 % (ref 0.0–3.0)
Eosinophils Absolute: 0.6 10*3/uL (ref 0.0–0.7)
Eosinophils Relative: 6.7 % — ABNORMAL HIGH (ref 0.0–5.0)
HCT: 39.8 % (ref 36.0–46.0)
Hemoglobin: 13.5 g/dL (ref 12.0–15.0)
Lymphocytes Relative: 33.1 % (ref 12.0–46.0)
Lymphs Abs: 2.9 10*3/uL (ref 0.7–4.0)
MCHC: 33.8 g/dL (ref 30.0–36.0)
MCV: 93.9 fl (ref 78.0–100.0)
Monocytes Absolute: 0.7 10*3/uL (ref 0.1–1.0)
Monocytes Relative: 7.5 % (ref 3.0–12.0)
Neutro Abs: 4.6 10*3/uL (ref 1.4–7.7)
Neutrophils Relative %: 51.9 % (ref 43.0–77.0)
Platelets: 430 10*3/uL — ABNORMAL HIGH (ref 150.0–400.0)
RBC: 4.24 Mil/uL (ref 3.87–5.11)
RDW: 14.2 % (ref 11.5–15.5)
WBC: 8.8 10*3/uL (ref 4.0–10.5)

## 2019-01-15 LAB — LIPID PANEL
Cholesterol: 185 mg/dL (ref 0–200)
HDL: 65.9 mg/dL (ref >39.00)
NonHDL: 119.45
Total CHOL/HDL Ratio: 3
Triglycerides: 207 mg/dL — ABNORMAL HIGH (ref 0.0–149.0)
VLDL: 41.4 mg/dL — ABNORMAL HIGH (ref 0.0–40.0)

## 2019-01-15 LAB — HEPATIC FUNCTION PANEL
ALT: 18 U/L (ref 0–35)
AST: 19 U/L (ref 0–37)
Albumin: 4.3 g/dL (ref 3.5–5.2)
Alkaline Phosphatase: 89 U/L (ref 39–117)
Bilirubin, Direct: 0.1 mg/dL (ref 0.0–0.3)
Total Bilirubin: 0.5 mg/dL (ref 0.2–1.2)
Total Protein: 6.7 g/dL (ref 6.0–8.3)

## 2019-01-15 LAB — LDL CHOLESTEROL, DIRECT: Direct LDL: 90 mg/dL

## 2019-01-15 LAB — TSH: TSH: 4.76 u[IU]/mL — ABNORMAL HIGH (ref 0.35–4.50)

## 2019-01-16 ENCOUNTER — Other Ambulatory Visit: Payer: Self-pay | Admitting: Internal Medicine

## 2019-01-16 DIAGNOSIS — R7989 Other specified abnormal findings of blood chemistry: Secondary | ICD-10-CM

## 2019-01-16 DIAGNOSIS — D75839 Thrombocytosis, unspecified: Secondary | ICD-10-CM

## 2019-01-16 DIAGNOSIS — D473 Essential (hemorrhagic) thrombocythemia: Secondary | ICD-10-CM

## 2019-01-16 NOTE — Progress Notes (Signed)
Orders placed for f/u labs.  

## 2019-01-17 ENCOUNTER — Other Ambulatory Visit: Payer: Self-pay

## 2019-01-17 ENCOUNTER — Ambulatory Visit (INDEPENDENT_AMBULATORY_CARE_PROVIDER_SITE_OTHER): Payer: PPO | Admitting: Internal Medicine

## 2019-01-17 VITALS — BP 128/78 | HR 81 | Temp 97.8°F | Resp 16 | Wt 143.4 lb

## 2019-01-17 DIAGNOSIS — Z720 Tobacco use: Secondary | ICD-10-CM | POA: Diagnosis not present

## 2019-01-17 DIAGNOSIS — L989 Disorder of the skin and subcutaneous tissue, unspecified: Secondary | ICD-10-CM

## 2019-01-17 DIAGNOSIS — D75839 Thrombocytosis, unspecified: Secondary | ICD-10-CM

## 2019-01-17 DIAGNOSIS — F439 Reaction to severe stress, unspecified: Secondary | ICD-10-CM | POA: Diagnosis not present

## 2019-01-17 DIAGNOSIS — Z0000 Encounter for general adult medical examination without abnormal findings: Secondary | ICD-10-CM

## 2019-01-17 DIAGNOSIS — E7800 Pure hypercholesterolemia, unspecified: Secondary | ICD-10-CM | POA: Diagnosis not present

## 2019-01-17 DIAGNOSIS — D473 Essential (hemorrhagic) thrombocythemia: Secondary | ICD-10-CM

## 2019-01-17 MED ORDER — ATORVASTATIN CALCIUM 20 MG PO TABS: ORAL_TABLET | ORAL | 3 refills | Status: DC

## 2019-01-17 NOTE — Assessment & Plan Note (Signed)
Physical today 01/17/19.  Mammogram 06/21/18 - Birads I.  Colonoscopy 12/2012 - diverticulosis.

## 2019-01-17 NOTE — Progress Notes (Signed)
[name], female   DOB: 1946/07/13, 73 y.o.   MRN: [id number]   [name], female    DOB: 11-01-1945, 73 y.o.   MRN: [id number]  HPI  Patient here for her physical exam.  She reports she is doing relatively well.  Increased stress with covid, etc.  Trying to stay in due to covid restrictions.  No fever.  No congestion, sob or chest pain.  No acid reflux.  No abdominal pain.  Bowels moving.  Had skin biopsy - lower leg. Persistent raised area.  Request f/u with dermatology here in [facility]. Discussed labs.  Continue lipitor.  Plan for recheck platelet count and tsh.  Discussed the need for smoking cessation.  She declines to quit.  Discussed screening chest CT.  She declines.     Past Medical History:  Diagnosis Date  . Chronic bronchitis ([place])   . Hypercholesterolemia   . Migraines    Past Surgical History:  Procedure Laterality Date  . ABDOMINAL HYSTERECTOMY  1979   secondary to fibroids, incidental appendectomy  . BREAST BIOPSY  1979   benign  . TONSILLECTOMY  1960   Family History  Problem Relation Age of Onset  . Heart disease Father        myocardial infarction - died age 71  . Heart disease Mother   . Diabetes Mother   . Diabetes Maternal Grandmother   . Breast cancer Neg Hx   . Colon cancer Neg Hx    Social History   Socioeconomic History  . Marital status: Married    Spouse name: Not on file  . Number of children: 1  . Years of education: Not on file  . Highest education level: Not on file  Occupational History  . Not on file  Social Needs  . Financial resource strain: Not hard at all  . Food insecurity    Worry: Never true    Inability: Never true  . Transportation needs    Medical: No    Non-medical: No  Tobacco Use  . Smoking status: Current Every Day Smoker    Packs/day: 1.00    Years: 50.00    Pack years: 50.00    Types: Cigarettes  . Smokeless tobacco: Never Used  . Tobacco comment: is going to  try electronic cigarettes  Substance and Sexual Activity  . Alcohol use: No    Alcohol/week: 0.0 standard drinks  . Drug use: No  . Sexual activity: Not Currently  Lifestyle  . Physical activity    Days per week: Not on file    Minutes per session: Not on file  . Stress: Not at all  Relationships  . Social Herbalist on phone: Not on file    Gets together: Not on file    Attends religious service: Not on file    Active member of club or organization: Not on file    Attends meetings of clubs or organizations: Not on file    Relationship status: Not on file  Other Topics Concern  . Not on file  Social History Narrative  . Not on file    Outpatient Encounter Medications as of 01/17/2019  Medication Sig  . atorvastatin (LIPITOR) 20 MG tablet TAKE ONE (1) TABLET BY MOUTH EVERY DAY  . Ibuprofen (ADVIL) 200 MG CAPS Take by mouth.  . [DISCONTINUED] atorvastatin (LIPITOR) 20 MG tablet TAKE ONE (1) TABLET BY MOUTH EVERY DAY   No  facility-administered encounter medications on file as of 01/17/2019.     Review of Systems  Constitutional: Negative for appetite change and unexpected weight change.  HENT: Negative for congestion and sinus pressure.   Eyes: Negative for pain and visual disturbance.  Respiratory: Negative for cough, chest tightness and shortness of breath.   Cardiovascular: Negative for chest pain, palpitations and leg swelling.  Gastrointestinal: Negative for abdominal pain, diarrhea, nausea and vomiting.  Genitourinary: Negative for difficulty urinating and dysuria.  Musculoskeletal: Negative for joint swelling and myalgias.  Skin: Negative for color change and rash.  Neurological: Negative for dizziness, light-headedness and headaches.  Hematological: Negative for adenopathy. Does not bruise/bleed easily.  Psychiatric/Behavioral: Negative for agitation and dysphoric mood.       Objective:    Physical Exam Constitutional:      General: She is not in acute  distress.    Appearance: Normal appearance. She is well-developed.  HENT:     Right Ear: External ear normal.     Left Ear: External ear normal.  Eyes:     General: No scleral icterus.       Right eye: No discharge.        Left eye: No discharge.     Conjunctiva/sclera: Conjunctivae normal.  Neck:     Musculoskeletal: Neck supple. No muscular tenderness.     Thyroid: No thyromegaly.  Cardiovascular:     Rate and Rhythm: Normal rate and regular rhythm.  Pulmonary:     Effort: No tachypnea, accessory muscle usage or respiratory distress.     Breath sounds: Normal breath sounds. No decreased breath sounds or wheezing.  Chest:     Breasts:        Right: No inverted nipple, mass, nipple discharge or tenderness (no axillary adenopathy).        Left: No inverted nipple, mass, nipple discharge or tenderness (no axilarry adenopathy).  Abdominal:     General: Bowel sounds are normal.     Palpations: Abdomen is soft.     Tenderness: There is no abdominal tenderness.  Musculoskeletal:        General: No swelling or tenderness.  Lymphadenopathy:     Cervical: No cervical adenopathy.  Skin:    Findings: No erythema or rash.  Neurological:     Mental Status: She is alert and oriented to person, place, and time.  Psychiatric:        Mood and Affect: Mood normal.        Behavior: Behavior normal.     BP 128/78   Pulse 81   Temp 97.8 F (36.6 C) (Oral)   Resp 16   Wt 143 lb 6.4 oz (65 kg)   SpO2 98%   BMI 24.61 kg/m  Wt Readings from Last 3 Encounters:  01/17/19 143 lb 6.4 oz (65 kg)  08/31/18 146 lb 6.4 oz (66.4 kg)  05/25/18 144 lb 3.2 oz (65.4 kg)     Lab Results  Component Value Date   WBC 8.8 01/15/2019   HGB 13.5 01/15/2019   HCT 39.8 01/15/2019   PLT 430.0 (H) 01/15/2019   GLUCOSE 91 01/15/2019   CHOL 185 01/15/2019   TRIG 207.0 (H) 01/15/2019   HDL 65.90 01/15/2019   LDLDIRECT 90.0 01/15/2019   LDLCALC 80 07/26/2018   ALT 18 01/15/2019   AST 19 01/15/2019    NA 139 01/15/2019   K 4.1 01/15/2019   CL 104 01/15/2019   CREATININE 0.71 01/15/2019   BUN 14 01/15/2019  CO2 26 01/15/2019   TSH 4.76 (H) 01/15/2019   MICROALBUR 0.7 01/24/2017    Nm Myocar Multi W/spect W/wall Motion / Ef  Result Date: 04/26/2018 Exercise myocardial perfusion imaging study with no significant  ischemia Normal wall motion, EF estimated at 54% No EKG changes concerning for ischemia at peak stress or in recovery. Target heart rate achieved 176 bpm 4.6 METS Low risk scan Signed, Esmond Plants, MD, Ph.D [facility]       Assessment & Plan:   Problem List Items Addressed This Visit    Health care maintenance    Physical today 01/17/19.  Mammogram 06/21/18 - Birads I.  Colonoscopy 12/2012 - diverticulosis.        Hypercholesterolemia    On lipitor.  Low cholesterol diet and exercise.  Follow lipid panel and liver function tests.   Lab Results  Component Value Date   CHOL 185 01/15/2019   HDL 65.90 01/15/2019   LDLCALC 80 07/26/2018   LDLDIRECT 90.0 01/15/2019   TRIG 207.0 (H) 01/15/2019   CHOLHDL 3 01/15/2019        Relevant Medications   atorvastatin (LIPITOR) 20 MG tablet   Skin lesion    Persistent raised area lower leg.  Refer to dermatology.  Pt request to see [facility] dermatology here in [place].        Relevant Orders   Ambulatory referral to Dermatology   Stress    Increased stress as outlined.  Discussed with her today. Overall she feels she is handling things relatively well.  Does not feel needs any further intervention.  Follow.        Thrombocytosis ([place])    Worked up by hematology.  Recent check slightly elevated.  Recheck cbc.        Tobacco abuse    Discussed the need to quit smoking.  She declines to quit.  Follow.  Declines screening chest CT.            Einar Pheasant, MD

## 2019-01-20 ENCOUNTER — Encounter: Payer: Self-pay | Admitting: Internal Medicine

## 2019-01-20 DIAGNOSIS — L989 Disorder of the skin and subcutaneous tissue, unspecified: Secondary | ICD-10-CM | POA: Insufficient documentation

## 2019-01-20 NOTE — Assessment & Plan Note (Signed)
On lipitor.  Low cholesterol diet and exercise.  Follow lipid panel and liver function tests.   Lab Results  Component Value Date   CHOL 185 01/15/2019   HDL 65.90 01/15/2019   LDLCALC 80 07/26/2018   LDLDIRECT 90.0 01/15/2019   TRIG 207.0 (H) 01/15/2019   CHOLHDL 3 01/15/2019

## 2019-01-20 NOTE — Assessment & Plan Note (Signed)
Discussed the need to quit smoking.  She declines to quit.  Follow.  Declines screening chest CT.

## 2019-01-20 NOTE — Assessment & Plan Note (Signed)
Increased stress as outlined.  Discussed with her today.  Overall she feels she is handling things relatively well.  Does not feel needs any further intervention.  Follow.   

## 2019-01-20 NOTE — Assessment & Plan Note (Signed)
Persistent raised area lower leg.  Refer to dermatology.  Pt request to see [facility] dermatology here in [place].

## 2019-01-20 NOTE — Assessment & Plan Note (Signed)
Worked up by hematology.  Recent check slightly elevated.  Recheck cbc.

## 2019-02-28 ENCOUNTER — Other Ambulatory Visit: Payer: Self-pay

## 2019-02-28 ENCOUNTER — Other Ambulatory Visit (INDEPENDENT_AMBULATORY_CARE_PROVIDER_SITE_OTHER): Payer: PPO

## 2019-02-28 DIAGNOSIS — D75839 Thrombocytosis, unspecified: Secondary | ICD-10-CM

## 2019-02-28 DIAGNOSIS — D473 Essential (hemorrhagic) thrombocythemia: Secondary | ICD-10-CM | POA: Diagnosis not present

## 2019-02-28 DIAGNOSIS — R7989 Other specified abnormal findings of blood chemistry: Secondary | ICD-10-CM

## 2019-02-28 DIAGNOSIS — L570 Actinic keratosis: Secondary | ICD-10-CM | POA: Diagnosis not present

## 2019-02-28 LAB — TSH: TSH: 2.51 u[IU]/mL (ref 0.35–4.50)

## 2019-02-28 LAB — PLATELET COUNT: Platelets: 425 10*3/uL — ABNORMAL HIGH (ref 140–400)

## 2019-05-29 ENCOUNTER — Encounter: Payer: Self-pay | Admitting: Internal Medicine

## 2019-05-29 ENCOUNTER — Ambulatory Visit (INDEPENDENT_AMBULATORY_CARE_PROVIDER_SITE_OTHER): Payer: PPO | Admitting: Internal Medicine

## 2019-05-29 ENCOUNTER — Other Ambulatory Visit: Payer: Self-pay

## 2019-05-29 DIAGNOSIS — F439 Reaction to severe stress, unspecified: Secondary | ICD-10-CM

## 2019-05-29 DIAGNOSIS — M13862 Other specified arthritis, left knee: Secondary | ICD-10-CM | POA: Diagnosis not present

## 2019-05-29 DIAGNOSIS — Z1231 Encounter for screening mammogram for malignant neoplasm of breast: Secondary | ICD-10-CM

## 2019-05-29 DIAGNOSIS — Z720 Tobacco use: Secondary | ICD-10-CM

## 2019-05-29 DIAGNOSIS — E7800 Pure hypercholesterolemia, unspecified: Secondary | ICD-10-CM | POA: Diagnosis not present

## 2019-05-29 DIAGNOSIS — D473 Essential (hemorrhagic) thrombocythemia: Secondary | ICD-10-CM

## 2019-05-29 DIAGNOSIS — D75839 Thrombocytosis, unspecified: Secondary | ICD-10-CM

## 2019-05-29 DIAGNOSIS — M25562 Pain in left knee: Secondary | ICD-10-CM

## 2019-05-29 NOTE — Progress Notes (Signed)
[name], female   DOB: 17-Mar-1946, 73 y.o.   MRN: [id number]   Virtual Visit via video Note  This visit type was conducted due to national recommendations for restrictions regarding the COVID-19 pandemic (e.g. social distancing).  This format is felt to be most appropriate for this patient at this time.  All issues noted in this document were discussed and addressed.  No physical exam was performed (except for noted visual exam findings with Video Visits).   I connected with Monica Morales by a video enabled telemedicine application and verified that I am speaking with the correct person using two identifiers. Location patient: home Location provider: work Persons participating in the virtual visit: patient, provider  I discussed the limitations, risks, security and privacy concerns of performing an evaluation and management service by video and the availability of in person appointments. The patient expressed understanding and agreed to proceed.   Reason for visit: scheduled follow up.    HPI: She reports she has been doing relatively well.  Over the last 3-4 weeks, she has had some left knee pain - posterior/lateral. Feels may be a Baker's cyst. She has been standing - doing a lot of canning and freezing.  Standing a lot.  Has noticed worse - when get up out of chair.  No calf tenderness or swelling.  No redness.  No chest pain.  No increased cough or congestion.  Breathing stable.  No abdominal pain.  Bowels moving.  Declines flu shot and all vaccines.  Is still smoking.  Discussed the need to quit today.     ROS: See pertinent positives and negatives per HPI.  Past Medical History:  Diagnosis Date  . Chronic bronchitis ([place])   . Hypercholesterolemia   . Migraines     Past Surgical History:  Procedure Laterality Date  . ABDOMINAL HYSTERECTOMY  1979   secondary to fibroids, incidental appendectomy  . BREAST BIOPSY  1979   benign  . TONSILLECTOMY  1960     Family History  Problem Relation Age of Onset  . Heart disease Father        myocardial infarction - died age 33  . Heart disease Mother   . Diabetes Mother   . Diabetes Maternal Grandmother   . Breast cancer Neg Hx   . Colon cancer Neg Hx     SOCIAL HX: reviewed.    Current Outpatient Medications:  .  atorvastatin (LIPITOR) 20 MG tablet, TAKE ONE (1) TABLET BY MOUTH EVERY DAY, Disp: 90 tablet, Rfl: 3 .  Ibuprofen (ADVIL) 200 MG CAPS, Take by mouth., Disp: , Rfl:   EXAM:  VITALS per patient if applicable:  [id number]  GENERAL: alert, oriented, appears well and in no acute distress  HEENT: atraumatic, conjunttiva clear, no obvious abnormalities on inspection of external nose and ears  NECK: normal movements of the head and neck  LUNGS: on inspection no signs of respiratory distress, breathing rate appears normal, no obvious gross SOB, gasping or wheezing  CV: no obvious cyanosis  PSYCH/NEURO: pleasant and cooperative, no obvious depression or anxiety, speech and thought processing grossly intact  ASSESSMENT AND PLAN:  Discussed the following assessment and plan:  Hypercholesterolemia On lipitor.  Low cholesterol diet and exercise.  Follow lipid panel and liver function tests.   Stress Increased stress - overall doing well.  Feels she is handling things well.  Follow.    Thrombocytosis ([place]) Worked up by hematology.  Follow cbc.   Tobacco abuse Discussed  the need to quit smoking.  She declines to quit.  Follow.    Left knee pain Persistent knee pain.  She is concerned regarding Baker's cyst.  Will have ortho evaluate.     I discussed the assessment and treatment plan with the patient. The patient was provided an opportunity to ask questions and all were answered. The patient agreed with the plan and demonstrated an understanding of the instructions.   The patient was advised to call back or seek an in-person evaluation if the symptoms worsen or if the condition  fails to improve as anticipated.   Einar Pheasant, MD

## 2019-05-31 ENCOUNTER — Encounter: Payer: Self-pay | Admitting: Internal Medicine

## 2019-06-02 ENCOUNTER — Encounter: Payer: Self-pay | Admitting: Internal Medicine

## 2019-06-02 DIAGNOSIS — M25562 Pain in left knee: Secondary | ICD-10-CM | POA: Insufficient documentation

## 2019-06-02 NOTE — Assessment & Plan Note (Signed)
Increased stress - overall doing well.  Feels she is handling things well.  Follow.

## 2019-06-02 NOTE — Assessment & Plan Note (Signed)
Persistent knee pain.  She is concerned regarding Baker's cyst.  Will have ortho evaluate.

## 2019-06-02 NOTE — Assessment & Plan Note (Signed)
Worked up by hematology.  Follow cbc.  

## 2019-06-02 NOTE — Assessment & Plan Note (Signed)
On lipitor.  Low cholesterol diet and exercise.  Follow lipid panel and liver function tests.   

## 2019-06-02 NOTE — Assessment & Plan Note (Signed)
Discussed the need to quit smoking.  She declines to quit.  Follow.   

## 2019-06-25 ENCOUNTER — Other Ambulatory Visit: Payer: Self-pay

## 2019-06-27 ENCOUNTER — Other Ambulatory Visit: Payer: Self-pay

## 2019-06-27 ENCOUNTER — Other Ambulatory Visit (INDEPENDENT_AMBULATORY_CARE_PROVIDER_SITE_OTHER): Payer: PPO

## 2019-06-27 DIAGNOSIS — E7800 Pure hypercholesterolemia, unspecified: Secondary | ICD-10-CM | POA: Diagnosis not present

## 2019-06-27 DIAGNOSIS — D473 Essential (hemorrhagic) thrombocythemia: Secondary | ICD-10-CM

## 2019-06-27 DIAGNOSIS — D75839 Thrombocytosis, unspecified: Secondary | ICD-10-CM

## 2019-06-27 LAB — CBC WITH DIFFERENTIAL/PLATELET
Basophils Absolute: 0.1 10*3/uL (ref 0.0–0.1)
Basophils Relative: 0.8 % (ref 0.0–3.0)
Eosinophils Absolute: 0.4 10*3/uL (ref 0.0–0.7)
Eosinophils Relative: 4.8 % (ref 0.0–5.0)
HCT: 39 % (ref 36.0–46.0)
Hemoglobin: 13.2 g/dL (ref 12.0–15.0)
Lymphocytes Relative: 34 % (ref 12.0–46.0)
Lymphs Abs: 2.8 10*3/uL (ref 0.7–4.0)
MCHC: 33.8 g/dL (ref 30.0–36.0)
MCV: 94.7 fl (ref 78.0–100.0)
Monocytes Absolute: 0.7 10*3/uL (ref 0.1–1.0)
Monocytes Relative: 8 % (ref 3.0–12.0)
Neutro Abs: 4.3 10*3/uL (ref 1.4–7.7)
Neutrophils Relative %: 52.4 % (ref 43.0–77.0)
Platelets: 435 10*3/uL — ABNORMAL HIGH (ref 150.0–400.0)
RBC: 4.12 Mil/uL (ref 3.87–5.11)
RDW: 13.5 % (ref 11.5–15.5)
WBC: 8.3 10*3/uL (ref 4.0–10.5)

## 2019-06-27 LAB — HEPATIC FUNCTION PANEL
ALT: 17 U/L (ref 0–35)
AST: 19 U/L (ref 0–37)
Albumin: 4.4 g/dL (ref 3.5–5.2)
Alkaline Phosphatase: 80 U/L (ref 39–117)
Bilirubin, Direct: 0.1 mg/dL (ref 0.0–0.3)
Total Bilirubin: 0.6 mg/dL (ref 0.2–1.2)
Total Protein: 7.3 g/dL (ref 6.0–8.3)

## 2019-06-27 LAB — LIPID PANEL
Cholesterol: 178 mg/dL (ref 0–200)
HDL: 75.3 mg/dL (ref >39.00)
LDL Cholesterol: 83 mg/dL (ref 0–99)
NonHDL: 103.06
Total CHOL/HDL Ratio: 2
Triglycerides: 99 mg/dL (ref 0.0–149.0)
VLDL: 19.8 mg/dL (ref 0.0–40.0)

## 2019-06-27 LAB — BASIC METABOLIC PANEL
BUN: 16 mg/dL (ref 6–23)
CO2: 29 mEq/L (ref 19–32)
Calcium: 9.5 mg/dL (ref 8.4–10.5)
Chloride: 104 mEq/L (ref 96–112)
Creatinine, Ser: 0.73 mg/dL (ref 0.40–1.20)
GFR: 77.64 mL/min (ref >60.00)
Glucose, Bld: 91 mg/dL (ref 70–99)
Potassium: 3.9 mEq/L (ref 3.5–5.1)
Sodium: 139 mEq/L (ref 135–145)

## 2019-06-27 LAB — TSH: TSH: 4.2 u[IU]/mL (ref 0.35–4.50)

## 2019-10-02 ENCOUNTER — Ambulatory Visit (INDEPENDENT_AMBULATORY_CARE_PROVIDER_SITE_OTHER): Payer: PPO | Admitting: Internal Medicine

## 2019-10-02 ENCOUNTER — Encounter: Payer: Self-pay | Admitting: Internal Medicine

## 2019-10-02 ENCOUNTER — Other Ambulatory Visit: Payer: Self-pay

## 2019-10-02 DIAGNOSIS — Z720 Tobacco use: Secondary | ICD-10-CM | POA: Diagnosis not present

## 2019-10-02 DIAGNOSIS — F439 Reaction to severe stress, unspecified: Secondary | ICD-10-CM | POA: Diagnosis not present

## 2019-10-02 DIAGNOSIS — E7800 Pure hypercholesterolemia, unspecified: Secondary | ICD-10-CM | POA: Diagnosis not present

## 2019-10-02 DIAGNOSIS — R0981 Nasal congestion: Secondary | ICD-10-CM | POA: Diagnosis not present

## 2019-10-02 DIAGNOSIS — M25562 Pain in left knee: Secondary | ICD-10-CM | POA: Diagnosis not present

## 2019-10-02 DIAGNOSIS — R030 Elevated blood-pressure reading, without diagnosis of hypertension: Secondary | ICD-10-CM

## 2019-10-02 DIAGNOSIS — D75839 Thrombocytosis, unspecified: Secondary | ICD-10-CM

## 2019-10-02 DIAGNOSIS — D473 Essential (hemorrhagic) thrombocythemia: Secondary | ICD-10-CM

## 2019-10-02 MED ORDER — IPRATROPIUM BROMIDE 0.03 % NA SOLN: 1.0000 | Freq: Two times a day (BID) | NASAL | 1 refills | Status: DC

## 2019-10-02 NOTE — Progress Notes (Addendum)
[name], female   DOB: [birth year], 74 y.o.   MRN: [id number]   [name], female    DOB: [birth year], 74 y.o.   MRN: [id number]  HPI This visit occurred during the SARS-CoV-2 public health emergency.  Safety protocols were in place, including screening questions prior to the visit, additional usage of staff PPE, and extensive cleaning of exam room while observing appropriate contact time as indicated for disinfecting solutions.  Patient here for a scheduled follow up.  Increased stress.  Discussed with her today.  She feels she is handling things relatively well.  Stays active.  No chest pain.  No sob.  Breathing stable.  No acid reflux.  No abdominal pain.  Bowels moving.  Left knee pain.  Started osteobiflex.  Taking advil q day.  Is better.  Desires no further w/up.  Still smoking.  Desires not to stop.  Discussed the need and recommendation to stop smoking.  She has been remodeling her bathroom and doing a lot of physical activity.  Some neck and shoulder discomfort with this increased activity.  Better now.  Align helping the increased gas.  Some dripping nose.  Discussed further treatment.     Past Medical History:  Diagnosis Date  . Chronic bronchitis ([place])   . Hypercholesterolemia   . Migraines    Past Surgical History:  Procedure Laterality Date  . ABDOMINAL HYSTERECTOMY  1979   secondary to fibroids, incidental appendectomy  . BREAST BIOPSY  1979   benign  . TONSILLECTOMY  1960   Family History  Problem Relation Age of Onset  . Heart disease Father        myocardial infarction - died age 59  . Heart disease Mother   . Diabetes Mother   . Diabetes Maternal Grandmother   . Breast cancer Neg Hx   . Colon cancer Neg Hx    Social History   Socioeconomic History  . Marital status: Married    Spouse name: Not on file  . Number of children: 1  . Years of education: Not on file  . Highest education level: Not on file    Occupational History  . Not on file  Tobacco Use  . Smoking status: Current Every Day Smoker    Packs/day: 1.00    Years: 50.00    Pack years: 50.00    Types: Cigarettes  . Smokeless tobacco: Never Used  . Tobacco comment: is going to try electronic cigarettes  Substance and Sexual Activity  . Alcohol use: No    Alcohol/week: 0.0 standard drinks  . Drug use: No  . Sexual activity: Not Currently  Other Topics Concern  . Not on file  Social History Narrative  . Not on file   Social Determinants of Health   Financial Resource Strain: Low Risk   . Difficulty of Paying Living Expenses: Not hard at all  Food Insecurity: No Food Insecurity  . Worried About Charity fundraiser in the Last Year: Never true  . Ran Out of Food in the Last Year: Never true  Transportation Needs: No Transportation Needs  . Lack of Transportation (Medical): No  . Lack of Transportation (Non-Medical): No  Physical Activity:   . Days of Exercise per Week:   . Minutes of Exercise per Session:   Stress: No Stress Concern Present  . Feeling of Stress : Not at all  Social Connections:   . Frequency of Communication with Friends  and Family:   . Frequency of Social Gatherings with Friends and Family:   . Attends Religious Services:   . Active Member of Clubs or Organizations:   . Attends Archivist Meetings:   Marland Kitchen Marital Status:     Outpatient Encounter Medications as of 10/02/2019  Medication Sig  . atorvastatin (LIPITOR) 20 MG tablet TAKE ONE (1) TABLET BY MOUTH EVERY DAY  . Ibuprofen (ADVIL) 200 MG CAPS Take by mouth.  Marland Kitchen ipratropium (ATROVENT) 0.03 % nasal spray Place 1 spray into both nostrils every 12 (twelve) hours.   No facility-administered encounter medications on file as of 10/02/2019.    Review of Systems  Constitutional: Negative for appetite change and unexpected weight change.  HENT: Negative for congestion and sinus pressure.        Dripping - nose.   Eyes: Negative for pain  and discharge.  Respiratory: Negative for cough, chest tightness and shortness of breath.   Cardiovascular: Negative for chest pain, palpitations and leg swelling.  Gastrointestinal: Negative for abdominal pain, diarrhea, nausea and vomiting.  Genitourinary: Negative for difficulty urinating and dysuria.  Musculoskeletal: Negative for joint swelling and myalgias.       Some neck and shoulder discomfort as outlined.  Doing a lot of physical activity.    Skin: Negative for color change and rash.  Neurological: Negative for dizziness, light-headedness and headaches.  Psychiatric/Behavioral: Negative for agitation and dysphoric mood.       Increased stress as outlined.         Objective:    Physical Exam Constitutional:      General: She is not in acute distress.    Appearance: Normal appearance.  HENT:     Head: Normocephalic and atraumatic.     Right Ear: External ear normal.     Left Ear: External ear normal.  Eyes:     General: No scleral icterus.       Right eye: No discharge.        Left eye: No discharge.     Conjunctiva/sclera: Conjunctivae normal.  Neck:     Thyroid: No thyromegaly.  Cardiovascular:     Rate and Rhythm: Normal rate and regular rhythm.  Pulmonary:     Effort: No respiratory distress.     Breath sounds: Normal breath sounds. No wheezing.  Abdominal:     General: Bowel sounds are normal.     Palpations: Abdomen is soft.     Tenderness: There is no abdominal tenderness.  Musculoskeletal:        General: No swelling or tenderness.     Cervical back: Neck supple. No tenderness.  Lymphadenopathy:     Cervical: No cervical adenopathy.  Skin:    Findings: No erythema or rash.  Neurological:     Mental Status: She is alert.  Psychiatric:        Mood and Affect: Mood normal.        Behavior: Behavior normal.     BP 130/78   Pulse 61   Temp 97.6 F (36.4 C)   Resp 16   Ht 5\' 3"  (1.6 m)   Wt 149 lb (67.6 kg)   SpO2 97%   BMI 26.39 kg/m  Wt  Readings from Last 3 Encounters:  10/02/19 149 lb (67.6 kg)  05/29/19 147 lb (66.7 kg)  01/17/19 143 lb 6.4 oz (65 kg)     Lab Results  Component Value Date   WBC 8.3 06/27/2019   HGB 13.2 06/27/2019  HCT 39.0 06/27/2019   PLT 435.0 (H) 06/27/2019   GLUCOSE 91 06/27/2019   CHOL 178 06/27/2019   TRIG 99.0 06/27/2019   HDL 75.30 06/27/2019   LDLDIRECT 90.0 01/15/2019   LDLCALC 83 06/27/2019   ALT 17 06/27/2019   AST 19 06/27/2019   NA 139 06/27/2019   K 3.9 06/27/2019   CL 104 06/27/2019   CREATININE 0.73 06/27/2019   BUN 16 06/27/2019   CO2 29 06/27/2019   TSH 4.20 06/27/2019   MICROALBUR 0.7 01/24/2017    NM Myocar Multi W/Spect W/Wall Motion / EF  Result Date: 04/26/2018 Exercise myocardial perfusion imaging study with no significant  ischemia Normal wall motion, EF estimated at 54% No EKG changes concerning for ischemia at peak stress or in recovery. Target heart rate achieved 176 bpm 4.6 METS Low risk scan Signed, Esmond Plants, MD, Ph.D [facility]       Assessment & Plan:   Problem List Items Addressed This Visit    Elevated blood pressure reading    On recheck, slightly increased blood pressure.  Have her spot check her pressure.  Send in readings.  Follow.        Hypercholesterolemia    On lipitor.  Low cholesterol diet and exercise.  Follow lipid panel and liver function tests.        Relevant Orders   Hepatic function panel   Lipid panel   Basic metabolic panel   Left knee pain    Left knee pain as outlined. Doing better.  Desires no further intervention.  Follow.        Nasal congestion    Nasal dripping as outlined.  atrovent nasal spray.  Follow.        Stress    Increased stress.  Discussed with her today.  Feels she is handling things relatively well.  Desires no further intervention.  Follow.        Thrombocytosis (HCC)    Recent platelet count elevated.  Has been evaluated by hematology.  Follow cbc.       Relevant Orders   CBC  with Differential/Platelet   Tobacco abuse    Discussed with her today.  Discussed the need to stop smoking.  She declines to quit.  Follow.            Einar Pheasant, MD

## 2019-10-07 ENCOUNTER — Encounter: Payer: Self-pay | Admitting: Internal Medicine

## 2019-10-07 DIAGNOSIS — R0981 Nasal congestion: Secondary | ICD-10-CM | POA: Insufficient documentation

## 2019-10-07 NOTE — Assessment & Plan Note (Signed)
Discussed with her today.  Discussed the need to stop smoking.  She declines to quit.  Follow.

## 2019-10-07 NOTE — Assessment & Plan Note (Signed)
Left knee pain as outlined. Doing better.  Desires no further intervention.  Follow.

## 2019-10-07 NOTE — Assessment & Plan Note (Signed)
Recent platelet count elevated.  Has been evaluated by hematology.  Follow cbc.

## 2019-10-07 NOTE — Assessment & Plan Note (Signed)
On recheck, slightly increased blood pressure.  Have her spot check her pressure.  Send in readings.  Follow.

## 2019-10-07 NOTE — Assessment & Plan Note (Signed)
On lipitor.  Low cholesterol diet and exercise.  Follow lipid panel and liver function tests.   

## 2019-10-07 NOTE — Assessment & Plan Note (Signed)
Nasal dripping as outlined.  atrovent nasal spray.  Follow.

## 2019-10-07 NOTE — Assessment & Plan Note (Signed)
Increased stress.  Discussed with her today.  Feels she is handling things relatively well.  Desires no further intervention.  Follow.

## 2019-10-17 ENCOUNTER — Ambulatory Visit
Admission: RE | Admit: 2019-10-17 | Discharge: 2019-10-17 | Disposition: A | Discharge status: Home / Self Care | Payer: PPO | Source: Ambulatory Visit | Attending: Internal Medicine | Admitting: Internal Medicine

## 2019-10-17 DIAGNOSIS — Z1231 Encounter for screening mammogram for malignant neoplasm of breast: Secondary | ICD-10-CM | POA: Insufficient documentation

## 2019-12-03 ENCOUNTER — Other Ambulatory Visit: Payer: Self-pay

## 2019-12-03 ENCOUNTER — Other Ambulatory Visit (INDEPENDENT_AMBULATORY_CARE_PROVIDER_SITE_OTHER): Payer: PPO

## 2019-12-03 DIAGNOSIS — E7800 Pure hypercholesterolemia, unspecified: Secondary | ICD-10-CM | POA: Diagnosis not present

## 2019-12-03 DIAGNOSIS — D75839 Thrombocytosis, unspecified: Secondary | ICD-10-CM

## 2019-12-03 DIAGNOSIS — D473 Essential (hemorrhagic) thrombocythemia: Secondary | ICD-10-CM

## 2019-12-03 LAB — CBC WITH DIFFERENTIAL/PLATELET
Basophils Absolute: 0.1 10*3/uL (ref 0.0–0.1)
Basophils Relative: 0.6 % (ref 0.0–3.0)
Eosinophils Absolute: 0.5 10*3/uL (ref 0.0–0.7)
Eosinophils Relative: 5.7 % — ABNORMAL HIGH (ref 0.0–5.0)
HCT: 41.2 % (ref 36.0–46.0)
Hemoglobin: 13.8 g/dL (ref 12.0–15.0)
Lymphocytes Relative: 31.8 % (ref 12.0–46.0)
Lymphs Abs: 2.7 10*3/uL (ref 0.7–4.0)
MCHC: 33.6 g/dL (ref 30.0–36.0)
MCV: 94.6 fl (ref 78.0–100.0)
Monocytes Absolute: 0.7 10*3/uL (ref 0.1–1.0)
Monocytes Relative: 8.3 % (ref 3.0–12.0)
Neutro Abs: 4.5 10*3/uL (ref 1.4–7.7)
Neutrophils Relative %: 53.6 % (ref 43.0–77.0)
Platelets: 413 10*3/uL — ABNORMAL HIGH (ref 150.0–400.0)
RBC: 4.35 Mil/uL (ref 3.87–5.11)
RDW: 13.5 % (ref 11.5–15.5)
WBC: 8.4 10*3/uL (ref 4.0–10.5)

## 2019-12-03 LAB — LIPID PANEL
Cholesterol: 183 mg/dL (ref 0–200)
HDL: 63.9 mg/dL (ref >39.00)
LDL Cholesterol: 84 mg/dL (ref 0–99)
NonHDL: 119.32
Total CHOL/HDL Ratio: 3
Triglycerides: 176 mg/dL — ABNORMAL HIGH (ref 0.0–149.0)
VLDL: 35.2 mg/dL (ref 0.0–40.0)

## 2019-12-03 LAB — BASIC METABOLIC PANEL
BUN: 19 mg/dL (ref 6–23)
CO2: 29 mEq/L (ref 19–32)
Calcium: 9.6 mg/dL (ref 8.4–10.5)
Chloride: 104 mEq/L (ref 96–112)
Creatinine, Ser: 0.68 mg/dL (ref 0.40–1.20)
GFR: 84.17 mL/min (ref >60.00)
Glucose, Bld: 97 mg/dL (ref 70–99)
Potassium: 4.4 mEq/L (ref 3.5–5.1)
Sodium: 141 mEq/L (ref 135–145)

## 2019-12-03 LAB — HEPATIC FUNCTION PANEL
ALT: 17 U/L (ref 0–35)
AST: 19 U/L (ref 0–37)
Albumin: 4.4 g/dL (ref 3.5–5.2)
Alkaline Phosphatase: 85 U/L (ref 39–117)
Bilirubin, Direct: 0.1 mg/dL (ref 0.0–0.3)
Total Bilirubin: 0.6 mg/dL (ref 0.2–1.2)
Total Protein: 7 g/dL (ref 6.0–8.3)

## 2019-12-27 ENCOUNTER — Ambulatory Visit (INDEPENDENT_AMBULATORY_CARE_PROVIDER_SITE_OTHER): Payer: PPO

## 2019-12-27 VITALS — Ht 63.0 in | Wt 149.0 lb

## 2019-12-27 DIAGNOSIS — Z0000 Encounter for general adult medical examination without abnormal findings: Secondary | ICD-10-CM | POA: Diagnosis not present

## 2019-12-27 NOTE — Patient Instructions (Addendum)
  Monica Morales , Thank you for taking time to come for your Medicare Wellness Visit. I appreciate your ongoing commitment to your health goals. Please review the following plan we discussed and let me know if I can assist you in the future.   These are the goals we discussed: Goals      Patient Stated   .  Follow up with Primary Care Provider (pt-stated)      As needed       This is a list of the screening recommended for you and due dates:  Health Maintenance  Topic Date Due  .  Hepatitis C: One time screening is recommended by Center for Disease Control  (CDC) for  adults born from [birth year] through 1967.   Never done  . Tetanus Vaccine  Never done  . Urine Protein Check  01/24/2018  . COVID-19 Vaccine (1) 01/12/2020*  . Colon Cancer Screening  12/20/2022  . DEXA scan (bone density measurement)  Completed  . Flu Shot  Discontinued  . Pneumonia vaccines  Discontinued  *Topic was postponed. The date shown is not the original due date.

## 2019-12-27 NOTE — Progress Notes (Addendum)
Subjective:   Monica Morales is a 74 y.o. female who presents for Medicare Annual (Subsequent) preventive examination.  Review of Systems:  No ROS.  Medicare Wellness Virtual Visit.    Cardiac Risk Factors include: advanced age (>79men, >60 women)     Objective:     Vitals: Ht 5\' 3"  (1.6 m)    Wt 149 lb (67.6 kg)    BMI 26.39 kg/m   Body mass index is 26.39 kg/m.  Advanced Directives 12/27/2019 12/26/2018 05/22/2018 11/23/2017 11/07/2017 08/30/2016 08/27/2015  Does Patient Have a Medical Advance Directive? Yes Yes Yes Yes Yes Yes Yes  Type of Paramedic of [place];Living will Living will [place];Living will [place];Living will Living will;Healthcare Power of [place];Living will [place];Living will  Does patient want to make changes to medical advance directive? No - Patient declined No - Patient declined - - - No - Patient declined No - Patient declined  Copy of [place] in Chart? No - copy requested - - - No - copy requested No - copy requested No - copy requested    Tobacco Social History   Tobacco Use  Smoking Status Current Every Day Smoker   Packs/day: 1.00   Years: 50.00   Pack years: 50.00   Types: Cigarettes  Smokeless Tobacco Never Used  Tobacco Comment   is going to try electronic cigarettes     Ready to quit: Not Answered Counseling given: Not Answered Comment: is going to try electronic cigarettes   Clinical Intake:  Pre-visit preparation completed: Yes        Diabetes: No  How often do you need to have someone help you when you read instructions, pamphlets, or other written materials from your doctor or pharmacy?: 1 - Never  Interpreter Needed?: No     Past Medical History:  Diagnosis Date   Chronic bronchitis ([place])    Hypercholesterolemia    Migraines    Past Surgical History:  Procedure Laterality  Date   ABDOMINAL HYSTERECTOMY  1979   secondary to fibroids, incidental appendectomy   BREAST BIOPSY Right 1979   benign   TONSILLECTOMY  1960   Family History  Problem Relation Age of Onset   Heart disease Father        myocardial infarction - died age 42   Heart disease Mother    Diabetes Mother    Diabetes Maternal Grandmother    Breast cancer Neg Hx    Colon cancer Neg Hx    Social History   Socioeconomic History   Marital status: Married    Spouse name: Not on file   Number of children: 1   Years of education: Not on file   Highest education level: Not on file  Occupational History   Not on file  Tobacco Use   Smoking status: Current Every Day Smoker    Packs/day: 1.00    Years: 50.00    Pack years: 50.00    Types: Cigarettes   Smokeless tobacco: Never Used   Tobacco comment: is going to try electronic cigarettes  Vaping Use   Vaping Use: Never used  Substance and Sexual Activity   Alcohol use: No    Alcohol/week: 0.0 standard drinks   Drug use: No   Sexual activity: Not Currently  Other Topics Concern   Not on file  Social History Narrative   Not on file   Social Determinants of  Health   Financial Resource Strain:    Difficulty of Paying Living Expenses:   Food Insecurity: No Food Insecurity   Worried About Running Out of Food in the Last Year: Never true   Ran Out of Food in the Last Year: Never true  Transportation Needs: No Transportation Needs   Lack of Transportation (Medical): No   Lack of Transportation (Non-Medical): No  Physical Activity:    Days of Exercise per Week:    Minutes of Exercise per Session:   Stress: No Stress Concern Present   Feeling of Stress : Not at all  Social Connections: Socially Integrated   Frequency of Communication with Friends and Family: More than three times a week   Frequency of Social Gatherings with Friends and Family: More than three times a week   Attends Religious  Services: More than 4 times per year   Active Member of [employer] or Organizations: Yes   Attends Archivist Meetings: 1 to 4 times per year   Marital Status: Married    Outpatient Encounter Medications as of 12/27/2019  Medication Sig   atorvastatin (LIPITOR) 20 MG tablet TAKE ONE (1) TABLET BY MOUTH EVERY DAY   Ibuprofen (ADVIL) 200 MG CAPS Take by mouth.   ipratropium (ATROVENT) 0.03 % nasal spray Place 1 spray into both nostrils every 12 (twelve) hours.   No facility-administered encounter medications on file as of 12/27/2019.    Activities of Daily Living In your present state of health, do you have any difficulty performing the following activities: 12/27/2019  Hearing? N  Vision? N  Difficulty concentrating or making decisions? N  Walking or climbing stairs? N  Dressing or bathing? N  Doing errands, shopping? N  Preparing Food and eating ? N  Using the Toilet? N  In the past six months, have you accidently leaked urine? N  Do you have problems with loss of bowel control? N  Managing your Medications? N  Managing your Finances? N  Housekeeping or managing your Housekeeping? N  Some recent data might be hidden    Patient Care Team: Einar Pheasant, MD as PCP - General (Internal Medicine)    Assessment:   This is a routine wellness examination for Monica Morales.  I connected with Monica Morales today by telephone and verified that I am speaking with the correct person using two identifiers. Location patient: home Location provider: work Persons participating in the virtual visit: patient, Marine scientist.    I discussed the limitations, risks, security and privacy concerns of performing an evaluation and management service by telephone and the availability of in person appointments. The patient expressed understanding and verbally consented to this telephonic visit.    Interactive audio and video telecommunications were attempted between this provider and patient, however failed,  due to patient having technical difficulties OR patient did not have access to video capability.  We continued and completed visit with audio only.  Some vital signs may be absent or patient reported.   Health Maintenance Due: -Tdap vaccine- discussed; to be completed with doctor in visit or local pharmacy.   -Hep C Screening- discussed; consent given.  -Urine microalbumin- labs followed by pcp.  See completed HM at the end of note.   Eye: Visual acuity not assessed. Virtual visit. Followed by their ophthalmologist.  Dental: UTD    Hearing: Demonstrates normal hearing during visit.  Safety:  Patient feels safe at home- yes Patient does have smoke detectors at home- yes Patient does wear sunscreen or protective clothing  when in direct sunlight - yes Patient does wear seat belt when in a moving vehicle - yes Patient drives- yes Adequate lighting in walkways free from debris- yes Grab bars and handrails used as appropriate- yes Ambulates with an assistive device- no  Social: Alcohol intake - no      Smoking history- current Illicit drug use? none  Medication: Taking as directed and without issues.  Self managed - yes   Covid-19: Precautions and sickness symptoms discussed. Wears mask, social distancing, hand hygiene as appropriate.   Activities of Daily Living Patient denies needing assistance with: household chores, feeding themselves, getting from bed to chair, getting to the toilet, bathing/showering, dressing, managing money, or preparing meals.   Discussed the importance of a healthy diet, water intake and the benefits of aerobic exercise.   Physical activity- No routine, active around the home.   Diet:  Regular Water: good intake Caffeine: 2.5 cups of coffee  Other Providers Patient Care Team: Einar Pheasant, MD as PCP - General (Internal Medicine) Exercise Activities and Dietary recommendations Current Exercise Habits: The patient does not participate in  regular exercise at present  Goals      Patient Stated     Follow up with Primary Care Provider (pt-stated)      As needed       Fall Risk Fall Risk  12/27/2019 05/29/2019 12/26/2018 08/30/2016 05/10/2016  Falls in the past year? 0 0 0 No No  Number falls in past yr: 0 - - - -  Follow up Falls evaluation completed Falls evaluation completed - - -   Is the patient's home free of loose throw rugs in walkways, pet beds, electrical cords, etc?  Yes      Grab bars in the bathroom? Yes      Handrails on the stairs? Yes      Adequate lighting? Yes  Timed Get Up and Go performed: No, virtual visit  Depression Screen PHQ 2/9 Scores 12/27/2019 12/26/2018 08/30/2016 05/10/2016  PHQ - 2 Score 0 0 0 0     Cognitive Function Patient is alert and oriented x3.  MMSE - Mini Mental State Exam 08/27/2015  Orientation to time 5  Orientation to Place 5  Registration 3  Attention/ Calculation 5  Recall 3  Language- name 2 objects 2  Language- repeat 1  Language- follow 3 step command 3  Language- read & follow direction 1  Write a sentence 1  Copy design 1  Total score 30     6CIT Screen 12/27/2019 12/26/2018 08/30/2016  What Year? 0 points 0 points 0 points  What month? 0 points 0 points 0 points  What time? - 0 points 0 points  Count back from 20 - 0 points 0 points  Months in reverse 0 points 0 points 0 points  Repeat phrase - 0 points 0 points  Total Score - 0 0     There is no immunization history on file for this patient.  Screening Tests Health Maintenance  Topic Date Due   Hepatitis C Screening  Never done   TETANUS/TDAP  Never done   URINE MICROALBUMIN  01/24/2018   COVID-19 Vaccine (1) 01/12/2020 (Originally 02/12/1958)   COLONOSCOPY  12/20/2022   DEXA SCAN  Completed   INFLUENZA VACCINE  Discontinued   PNA vac Low Risk Adult  Discontinued      Plan:   Keep all routine maintenance appointments.   Cpe 02/04/20 @ 10:00  Medicare Attestation I have personally  reviewed:  The patient's medical and social history Their use of alcohol, tobacco or illicit drugs Their current medications and supplements The patient's functional ability including ADLs,fall risks, home safety risks, cognitive, and hearing and visual impairment Diet and physical activities Evidence for depression   I have reviewed and discussed with patient certain preventive protocols, quality metrics, and best practice recommendations.     Varney Biles, LPN  5/40/9811   Reviewed above information.  Agree with assessment and plan.   Dr Nicki Reaper

## 2020-01-29 ENCOUNTER — Other Ambulatory Visit: Payer: Self-pay | Admitting: Internal Medicine

## 2020-02-04 ENCOUNTER — Ambulatory Visit (INDEPENDENT_AMBULATORY_CARE_PROVIDER_SITE_OTHER): Payer: PPO | Admitting: Internal Medicine

## 2020-02-04 ENCOUNTER — Other Ambulatory Visit: Payer: Self-pay

## 2020-02-04 VITALS — BP 122/78 | HR 83 | Temp 97.5°F | Resp 16 | Ht 63.0 in | Wt 146.4 lb

## 2020-02-04 DIAGNOSIS — Z716 Tobacco abuse counseling: Secondary | ICD-10-CM | POA: Diagnosis not present

## 2020-02-04 DIAGNOSIS — R030 Elevated blood-pressure reading, without diagnosis of hypertension: Secondary | ICD-10-CM

## 2020-02-04 DIAGNOSIS — F439 Reaction to severe stress, unspecified: Secondary | ICD-10-CM

## 2020-02-04 DIAGNOSIS — D473 Essential (hemorrhagic) thrombocythemia: Secondary | ICD-10-CM | POA: Diagnosis not present

## 2020-02-04 DIAGNOSIS — M79672 Pain in left foot: Secondary | ICD-10-CM

## 2020-02-04 DIAGNOSIS — E7800 Pure hypercholesterolemia, unspecified: Secondary | ICD-10-CM

## 2020-02-04 DIAGNOSIS — M79671 Pain in right foot: Secondary | ICD-10-CM

## 2020-02-04 DIAGNOSIS — G2581 Restless legs syndrome: Secondary | ICD-10-CM

## 2020-02-04 DIAGNOSIS — Z0000 Encounter for general adult medical examination without abnormal findings: Secondary | ICD-10-CM | POA: Diagnosis not present

## 2020-02-04 DIAGNOSIS — D75839 Thrombocytosis, unspecified: Secondary | ICD-10-CM

## 2020-02-04 LAB — IBC + FERRITIN
Ferritin: 36.4 ng/mL (ref 10.0–291.0)
Iron: 106 ug/dL (ref 42–145)
Saturation Ratios: 26.8 % (ref 20.0–50.0)
Transferrin: 283 mg/dL (ref 212.0–360.0)

## 2020-02-04 LAB — CBC WITH DIFFERENTIAL/PLATELET
Basophils Absolute: 0.1 10*3/uL (ref 0.0–0.1)
Basophils Relative: 0.7 % (ref 0.0–3.0)
Eosinophils Absolute: 0.4 10*3/uL (ref 0.0–0.7)
Eosinophils Relative: 4.2 % (ref 0.0–5.0)
HCT: 40.5 % (ref 36.0–46.0)
Hemoglobin: 13.8 g/dL (ref 12.0–15.0)
Lymphocytes Relative: 28.6 % (ref 12.0–46.0)
Lymphs Abs: 2.5 10*3/uL (ref 0.7–4.0)
MCHC: 34.1 g/dL (ref 30.0–36.0)
MCV: 95.2 fl (ref 78.0–100.0)
Monocytes Absolute: 0.7 10*3/uL (ref 0.1–1.0)
Monocytes Relative: 7.9 % (ref 3.0–12.0)
Neutro Abs: 5.1 10*3/uL (ref 1.4–7.7)
Neutrophils Relative %: 58.6 % (ref 43.0–77.0)
Platelets: 374 10*3/uL (ref 150.0–400.0)
RBC: 4.26 Mil/uL (ref 3.87–5.11)
RDW: 13.7 % (ref 11.5–15.5)
WBC: 8.7 10*3/uL (ref 4.0–10.5)

## 2020-02-04 LAB — BASIC METABOLIC PANEL
BUN: 17 mg/dL (ref 6–23)
CO2: 28 mEq/L (ref 19–32)
Calcium: 9.6 mg/dL (ref 8.4–10.5)
Chloride: 104 mEq/L (ref 96–112)
Creatinine, Ser: 0.81 mg/dL (ref 0.40–1.20)
GFR: 68.75 mL/min (ref >60.00)
Glucose, Bld: 92 mg/dL (ref 70–99)
Potassium: 4.6 mEq/L (ref 3.5–5.1)
Sodium: 140 mEq/L (ref 135–145)

## 2020-02-04 LAB — VITAMIN D 25 HYDROXY (VIT D DEFICIENCY, FRACTURES): VITD: 28.55 ng/mL — ABNORMAL LOW (ref 30.00–100.00)

## 2020-02-04 LAB — VITAMIN B12: Vitamin B-12: 1043 pg/mL — ABNORMAL HIGH (ref 211–911)

## 2020-02-04 MED ORDER — ATORVASTATIN CALCIUM 20 MG PO TABS: 20.0000 mg | ORAL_TABLET | Freq: Every day | ORAL | 3 refills | Status: DC

## 2020-02-04 NOTE — Progress Notes (Signed)
[name], female   DOB: 1945-07-28, 74 y.o.   MRN: [id number]   [name], female    DOB: Mar 17, 1946, 74 y.o.   MRN: [id number]  HPI This visit occurred during the SARS-CoV-2 public health emergency.  Safety protocols were in place, including screening questions prior to the visit, additional usage of staff PPE, and extensive cleaning of exam room while observing appropriate contact time as indicated for disinfecting solutions.  Patient here for her physical exam.  She reports she is doing relatively well.  Reports restless legs.  On her feet more.  Right foot bothers her.  Plans to f/u with Dr Milinda Pointer.  No chest pain.  Breathing stable.  No acid reflux.  No abdominal pain.  No bowel change reported.  Discussed the need to quit smoking.   Past Medical History:  Diagnosis Date  . Chronic bronchitis ([employer])   . Hypercholesterolemia   . Migraines    Past Surgical History:  Procedure Laterality Date  . ABDOMINAL HYSTERECTOMY  1979   secondary to fibroids, incidental appendectomy  . BREAST BIOPSY Right 1979   benign  . TONSILLECTOMY  1960   Family History  Problem Relation Age of Onset  . Heart disease Father        myocardial infarction - died age 68  . Heart disease Mother   . Diabetes Mother   . Diabetes Maternal Grandmother   . Breast cancer Neg Hx   . Colon cancer Neg Hx    Social History   Socioeconomic History  . Marital status: Married    Spouse name: Not on file  . Number of children: 1  . Years of education: Not on file  . Highest education level: Not on file  Occupational History  . Not on file  Tobacco Use  . Smoking status: Current Every Day Smoker    Packs/day: 1.00    Years: 50.00    Pack years: 50.00    Types: Cigarettes  . Smokeless tobacco: Never Used  . Tobacco comment: is going to try electronic cigarettes  Vaping Use  . Vaping Use: Never used  Substance and Sexual Activity  . Alcohol use: No     Alcohol/week: 0.0 standard drinks  . Drug use: No  . Sexual activity: Not Currently  Other Topics Concern  . Not on file  Social History Narrative  . Not on file   Social Determinants of Health   Financial Resource Strain:   . Difficulty of Paying Living Expenses:   Food Insecurity: No Food Insecurity  . Worried About Charity fundraiser in the Last Year: Never true  . Ran Out of Food in the Last Year: Never true  Transportation Needs: No Transportation Needs  . Lack of Transportation (Medical): No  . Lack of Transportation (Non-Medical): No  Physical Activity:   . Days of Exercise per Week:   . Minutes of Exercise per Session:   Stress: No Stress Concern Present  . Feeling of Stress : Not at all  Social Connections: Socially Integrated  . Frequency of Communication with Friends and Family: More than three times a week  . Frequency of Social Gatherings with Friends and Family: More than three times a week  . Attends Religious Services: More than 4 times per year  . Active Member of Clubs or Organizations: Yes  . Attends Archivist Meetings: 1 to 4 times per year  . Marital Status: Married  Outpatient Encounter Medications as of 02/04/2020  Medication Sig  . atorvastatin (LIPITOR) 20 MG tablet Take 1 tablet (20 mg total) by mouth daily.  . Ibuprofen (ADVIL) 200 MG CAPS Take by mouth.  Marland Kitchen ipratropium (ATROVENT) 0.03 % nasal spray Place 1 spray into both nostrils every 12 (twelve) hours.  . [DISCONTINUED] atorvastatin (LIPITOR) 20 MG tablet TAKE ONE (1) TABLET BY MOUTH EVERY DAY   No facility-administered encounter medications on file as of 02/04/2020.    Review of Systems  Constitutional: Negative for appetite change and unexpected weight change.  HENT: Negative for congestion and sinus pressure.   Eyes: Negative for pain and visual disturbance.  Respiratory: Negative for cough, chest tightness and shortness of breath.   Cardiovascular: Negative for chest pain,  palpitations and leg swelling.  Gastrointestinal: Negative for abdominal pain, diarrhea, nausea and vomiting.  Genitourinary: Negative for difficulty urinating and dysuria.  Musculoskeletal: Negative for joint swelling and myalgias.       Restless legs and foot pain as outlined.    Skin: Negative for color change and rash.  Neurological: Negative for dizziness, light-headedness and headaches.  Hematological: Negative for adenopathy. Does not bruise/bleed easily.  Psychiatric/Behavioral: Negative for agitation and dysphoric mood.       Objective:    Physical Exam Vitals reviewed.  Constitutional:      General: She is not in acute distress.    Appearance: Normal appearance. She is well-developed.  HENT:     Head: Normocephalic and atraumatic.     Right Ear: External ear normal.     Left Ear: External ear normal.  Eyes:     General: No scleral icterus.       Right eye: No discharge.        Left eye: No discharge.     Conjunctiva/sclera: Conjunctivae normal.  Neck:     Thyroid: No thyromegaly.  Cardiovascular:     Rate and Rhythm: Normal rate and regular rhythm.  Pulmonary:     Effort: No tachypnea, accessory muscle usage or respiratory distress.     Breath sounds: Normal breath sounds. No decreased breath sounds or wheezing.  Chest:     Breasts:        Right: No inverted nipple, mass, nipple discharge or tenderness (no axillary adenopathy).        Left: No inverted nipple, mass, nipple discharge or tenderness (no axilarry adenopathy).  Abdominal:     General: Bowel sounds are normal.     Palpations: Abdomen is soft.     Tenderness: There is no abdominal tenderness.  Musculoskeletal:        General: No swelling or tenderness.     Cervical back: Neck supple. No tenderness.  Lymphadenopathy:     Cervical: No cervical adenopathy.  Skin:    Findings: No erythema or rash.  Neurological:     Mental Status: She is alert and oriented to person, place, and time.  Psychiatric:         Mood and Affect: Mood normal.        Behavior: Behavior normal.     BP 122/78   Pulse 83   Temp (!) 97.5 F (36.4 C)   Resp 16   Ht 5\' 3"  (1.6 m)   Wt 146 lb 6.4 oz (66.4 kg)   SpO2 99%   BMI 25.93 kg/m  Wt Readings from Last 3 Encounters:  02/04/20 146 lb 6.4 oz (66.4 kg)  12/27/19 149 lb (67.6 kg)  10/02/19 149 lb (67.6  kg)     Lab Results  Component Value Date   WBC 8.7 02/04/2020   HGB 13.8 02/04/2020   HCT 40.5 02/04/2020   PLT 374.0 02/04/2020   GLUCOSE 92 02/04/2020   CHOL 183 12/03/2019   TRIG 176.0 (H) 12/03/2019   HDL 63.90 12/03/2019   LDLDIRECT 90.0 01/15/2019   LDLCALC 84 12/03/2019   ALT 17 12/03/2019   AST 19 12/03/2019   NA 140 02/04/2020   K 4.6 02/04/2020   CL 104 02/04/2020   CREATININE 0.81 02/04/2020   BUN 17 02/04/2020   CO2 28 02/04/2020   TSH 4.20 06/27/2019   MICROALBUR 0.7 01/24/2017    MM 3D SCREEN BREAST BILATERAL  Result Date: 10/23/2019 CLINICAL DATA:  Screening. EXAM: DIGITAL SCREENING BILATERAL MAMMOGRAM WITH TOMO AND CAD COMPARISON:  Previous exam(s). ACR Breast Density Category b: There are scattered areas of fibroglandular density. FINDINGS: There are no findings suspicious for malignancy. Images were processed with CAD. IMPRESSION: No mammographic evidence of malignancy. A result letter of this screening mammogram will be mailed directly to the patient. RECOMMENDATION: Screening mammogram in one year. (Code:SM-B-01Y) BI-RADS CATEGORY  1: Negative. Electronically Signed   By: Margarette Canada M.D.   On: 10/23/2019 10:13       Assessment & Plan:   Problem List Items Addressed This Visit    Elevated blood pressure reading   Foot pain, bilateral    Has previously seen podiatry. Right bothering her more.  She will make a f/u appt with Dr Milinda Pointer.        Health care maintenance    Physical today 02/04/20.  Mammogram 10/23/19 - Birads I.  Colonoscopy 12/2012 - diverticulosis.        Hypercholesterolemia - Primary    On lipitor.   Low cholesterol diet and exercise.  Follow lipid panel and liver function tests.        Relevant Medications   atorvastatin (LIPITOR) 20 MG tablet   Other Relevant Orders   Hepatic function panel   Lipid panel   Basic metabolic panel   Restless leg    Check cbc, electrolytes and iron studies.  Stretches.  Compression hose.  Follow.        Relevant Orders   CBC with Differential/Platelet (Completed)   Basic metabolic panel (Completed)   IBC + Ferritin (Completed)   Vitamin B12 (Completed)   VITAMIN D 25 Hydroxy (Vit-D Deficiency, Fractures) (Completed)   Stress    Overall appears to be handling things well.  Follow.        Thrombocytosis ([place])    Has been evaluated by hematology.  Follow cbc.       Tobacco abuse counseling    Discussed the need to stop smoking.  She desires not to quit at this time.            Einar Pheasant, MD

## 2020-02-04 NOTE — Assessment & Plan Note (Signed)
Physical today 02/04/20.  Mammogram 10/23/19 - Birads I.  Colonoscopy 12/2012 - diverticulosis.

## 2020-02-10 ENCOUNTER — Encounter: Payer: Self-pay | Admitting: Internal Medicine

## 2020-02-10 NOTE — Assessment & Plan Note (Signed)
Check cbc, electrolytes and iron studies.  Stretches.  Compression hose.  Follow.

## 2020-02-10 NOTE — Assessment & Plan Note (Signed)
Overall appears to be handling things well.  Follow.  ?

## 2020-02-10 NOTE — Assessment & Plan Note (Signed)
Has previously seen podiatry. Right bothering her more.  She will make a f/u appt with Dr Milinda Pointer.

## 2020-02-10 NOTE — Assessment & Plan Note (Signed)
On lipitor.  Low cholesterol diet and exercise.  Follow lipid panel and liver function tests.   

## 2020-02-10 NOTE — Assessment & Plan Note (Signed)
Has been evaluated by hematology.  Follow cbc.   

## 2020-02-10 NOTE — Assessment & Plan Note (Signed)
Discussed the need to stop smoking.  She desires not to quit at this time.

## 2020-04-02 ENCOUNTER — Other Ambulatory Visit (INDEPENDENT_AMBULATORY_CARE_PROVIDER_SITE_OTHER): Payer: PPO

## 2020-04-02 ENCOUNTER — Other Ambulatory Visit: Payer: Self-pay

## 2020-04-02 DIAGNOSIS — E7800 Pure hypercholesterolemia, unspecified: Secondary | ICD-10-CM

## 2020-04-02 LAB — HEPATIC FUNCTION PANEL
ALT: 14 U/L (ref 0–35)
AST: 17 U/L (ref 0–37)
Albumin: 4.5 g/dL (ref 3.5–5.2)
Alkaline Phosphatase: 81 U/L (ref 39–117)
Bilirubin, Direct: 0.2 mg/dL (ref 0.0–0.3)
Total Bilirubin: 0.6 mg/dL (ref 0.2–1.2)
Total Protein: 7.2 g/dL (ref 6.0–8.3)

## 2020-04-02 LAB — LIPID PANEL
Cholesterol: 167 mg/dL (ref 0–200)
HDL: 69.8 mg/dL (ref >39.00)
LDL Cholesterol: 70 mg/dL (ref 0–99)
NonHDL: 97.3
Total CHOL/HDL Ratio: 2
Triglycerides: 138 mg/dL (ref 0.0–149.0)
VLDL: 27.6 mg/dL (ref 0.0–40.0)

## 2020-04-02 LAB — BASIC METABOLIC PANEL
BUN: 13 mg/dL (ref 6–23)
CO2: 28 mEq/L (ref 19–32)
Calcium: 9.6 mg/dL (ref 8.4–10.5)
Chloride: 105 mEq/L (ref 96–112)
Creatinine, Ser: 0.7 mg/dL (ref 0.40–1.20)
GFR: 81.33 mL/min (ref >60.00)
Glucose, Bld: 92 mg/dL (ref 70–99)
Potassium: 4.2 mEq/L (ref 3.5–5.1)
Sodium: 140 mEq/L (ref 135–145)

## 2020-04-07 ENCOUNTER — Encounter: Payer: Self-pay | Admitting: Internal Medicine

## 2020-04-07 ENCOUNTER — Ambulatory Visit (INDEPENDENT_AMBULATORY_CARE_PROVIDER_SITE_OTHER): Payer: PPO | Admitting: Internal Medicine

## 2020-04-07 ENCOUNTER — Other Ambulatory Visit: Payer: Self-pay

## 2020-04-07 DIAGNOSIS — R030 Elevated blood-pressure reading, without diagnosis of hypertension: Secondary | ICD-10-CM

## 2020-04-07 DIAGNOSIS — F439 Reaction to severe stress, unspecified: Secondary | ICD-10-CM

## 2020-04-07 DIAGNOSIS — Z716 Tobacco abuse counseling: Secondary | ICD-10-CM | POA: Diagnosis not present

## 2020-04-07 DIAGNOSIS — E7800 Pure hypercholesterolemia, unspecified: Secondary | ICD-10-CM | POA: Diagnosis not present

## 2020-04-07 DIAGNOSIS — D75839 Thrombocytosis, unspecified: Secondary | ICD-10-CM

## 2020-04-07 DIAGNOSIS — D473 Essential (hemorrhagic) thrombocythemia: Secondary | ICD-10-CM | POA: Diagnosis not present

## 2020-04-07 NOTE — Progress Notes (Signed)
[name], female   DOB: 1946/02/29, 74 y.o.   MRN: [id number]   [name], female    DOB: 1945-11-12, 74 y.o.   MRN: [id number]  HPI This visit occurred during the SARS-CoV-2 public health emergency.  Safety protocols were in place, including screening questions prior to the visit, additional usage of staff PPE, and extensive cleaning of exam room while observing appropriate contact time as indicated for disinfecting solutions.  Patient here for a scheduled follow up. Here to f/u regarding her cholesterol.  She reports she is doing relatively well.  Had questions about covid vaccine.  Discussed.  Recommended her take the vaccine.  Questions answered.  Breathing stable.  No increased cough, congestion or sob.  No acid reflux.  No abdominal pain.  Bowels moving.  Discussed labs.  Still smoking.  Discussed the need to quit smoking.  She declines.  Discussed screening CT - chest - lung cancer screening.  She declines.     Past Medical History:  Diagnosis Date  . Chronic bronchitis ([place])   . Hypercholesterolemia   . Migraines    Past Surgical History:  Procedure Laterality Date  . ABDOMINAL HYSTERECTOMY  1979   secondary to fibroids, incidental appendectomy  . BREAST BIOPSY Right 1979   benign  . TONSILLECTOMY  1960   Family History  Problem Relation Age of Onset  . Heart disease Father        myocardial infarction - died age 43  . Heart disease Mother   . Diabetes Mother   . Diabetes Maternal Grandmother   . Breast cancer Neg Hx   . Colon cancer Neg Hx    Social History   Socioeconomic History  . Marital status: Married    Spouse name: Not on file  . Number of children: 1  . Years of education: Not on file  . Highest education level: Not on file  Occupational History  . Not on file  Tobacco Use  . Smoking status: Current Every Day Smoker    Packs/day: 1.00    Years: 50.00    Pack years: 50.00    Types: Cigarettes  .  Smokeless tobacco: Never Used  . Tobacco comment: is going to try electronic cigarettes  Vaping Use  . Vaping Use: Never used  Substance and Sexual Activity  . Alcohol use: No    Alcohol/week: 0.0 standard drinks  . Drug use: No  . Sexual activity: Not Currently  Other Topics Concern  . Not on file  Social History Narrative  . Not on file   Social Determinants of Health   Financial Resource Strain:   . Difficulty of Paying Living Expenses: Not on file  Food Insecurity: No Food Insecurity  . Worried About Charity fundraiser in the Last Year: Never true  . Ran Out of Food in the Last Year: Never true  Transportation Needs: No Transportation Needs  . Lack of Transportation (Medical): No  . Lack of Transportation (Non-Medical): No  Physical Activity:   . Days of Exercise per Week: Not on file  . Minutes of Exercise per Session: Not on file  Stress: No Stress Concern Present  . Feeling of Stress : Not at all  Social Connections: Socially Integrated  . Frequency of Communication with Friends and Family: More than three times a week  . Frequency of Social Gatherings with Friends and Family: More than three times a week  . Attends Religious  Services: More than 4 times per year  . Active Member of Clubs or Organizations: Yes  . Attends Archivist Meetings: 1 to 4 times per year  . Marital Status: Married    Outpatient Encounter Medications as of 04/07/2020  Medication Sig  . atorvastatin (LIPITOR) 20 MG tablet Take 1 tablet (20 mg total) by mouth daily.  . Ibuprofen (ADVIL) 200 MG CAPS Take by mouth.  Marland Kitchen ipratropium (ATROVENT) 0.03 % nasal spray Place 1 spray into both nostrils every 12 (twelve) hours.   No facility-administered encounter medications on file as of 04/07/2020.    Review of Systems  Constitutional: Negative for appetite change and unexpected weight change.  HENT: Negative for congestion and sinus pressure.   Respiratory: Negative for cough, chest  tightness and shortness of breath.   Cardiovascular: Negative for chest pain, palpitations and leg swelling.  Gastrointestinal: Negative for abdominal pain, diarrhea, nausea and vomiting.  Genitourinary: Negative for difficulty urinating and dysuria.  Musculoskeletal: Negative for joint swelling and myalgias.  Skin: Negative for color change and rash.  Neurological: Negative for dizziness, light-headedness and headaches.  Psychiatric/Behavioral: Negative for agitation and dysphoric mood.       Objective:    Physical Exam Vitals reviewed.  Constitutional:      General: She is not in acute distress.    Appearance: Normal appearance.  HENT:     Head: Normocephalic and atraumatic.     Right Ear: External ear normal.     Left Ear: External ear normal.  Eyes:     General: No scleral icterus.       Right eye: No discharge.        Left eye: No discharge.     Conjunctiva/sclera: Conjunctivae normal.  Neck:     Thyroid: No thyromegaly.  Cardiovascular:     Rate and Rhythm: Normal rate and regular rhythm.  Pulmonary:     Effort: No respiratory distress.     Breath sounds: Normal breath sounds. No wheezing.  Abdominal:     General: Bowel sounds are normal.     Palpations: Abdomen is soft.     Tenderness: There is no abdominal tenderness.  Musculoskeletal:        General: No swelling or tenderness.     Cervical back: Neck supple. No tenderness.  Lymphadenopathy:     Cervical: No cervical adenopathy.  Skin:    Findings: No erythema or rash.  Neurological:     Mental Status: She is alert.  Psychiatric:        Mood and Affect: Mood normal.        Behavior: Behavior normal.     BP 136/78   Pulse 88   Temp 97.6 F (36.4 C) (Oral)   Resp 16   Ht 5\' 3"  (1.6 m)   Wt 148 lb (67.1 kg)   SpO2 97%   BMI 26.22 kg/m  Wt Readings from Last 3 Encounters:  04/07/20 148 lb (67.1 kg)  02/04/20 146 lb 6.4 oz (66.4 kg)  12/27/19 149 lb (67.6 kg)     Lab Results  Component Value  Date   WBC 8.7 02/04/2020   HGB 13.8 02/04/2020   HCT 40.5 02/04/2020   PLT 374.0 02/04/2020   GLUCOSE 92 04/02/2020   CHOL 167 04/02/2020   TRIG 138.0 04/02/2020   HDL 69.80 04/02/2020   LDLDIRECT 90.0 01/15/2019   LDLCALC 70 04/02/2020   ALT 14 04/02/2020   AST 17 04/02/2020   NA 140 04/02/2020  K 4.2 04/02/2020   CL 105 04/02/2020   CREATININE 0.70 04/02/2020   BUN 13 04/02/2020   CO2 28 04/02/2020   TSH 4.20 06/27/2019   MICROALBUR 0.7 01/24/2017    MM 3D SCREEN BREAST BILATERAL  Result Date: 10/23/2019 CLINICAL DATA:  Screening. EXAM: DIGITAL SCREENING BILATERAL MAMMOGRAM WITH TOMO AND CAD COMPARISON:  Previous exam(s). ACR Breast Density Category b: There are scattered areas of fibroglandular density. FINDINGS: There are no findings suspicious for malignancy. Images were processed with CAD. IMPRESSION: No mammographic evidence of malignancy. A result letter of this screening mammogram will be mailed directly to the patient. RECOMMENDATION: Screening mammogram in one year. (Code:SM-B-01Y) BI-RADS CATEGORY  1: Negative. Electronically Signed   By: Margarette Canada M.D.   On: 10/23/2019 10:13       Assessment & Plan:   Problem List Items Addressed This Visit    Tobacco abuse counseling    Discussed with her regarding the need to quit smoking.  She declines to quit at this time.  Follow.        Thrombocytosis ([place])    Follow cbc.       Stress    Overall appears to be handling things well.  Follow.       Hypercholesterolemia    On atorvastatin.  Low cholesterol diet and exercise.  Follow lipid panel and liver function tests.   Lab Results  Component Value Date   CHOL 167 04/02/2020   HDL 69.80 04/02/2020   LDLCALC 70 04/02/2020   LDLDIRECT 90.0 01/15/2019   TRIG 138.0 04/02/2020   CHOLHDL 2 04/02/2020        Relevant Orders   Hepatic function panel   Lipid panel   TSH   Basic metabolic panel   Elevated blood pressure reading    Slightly elevation here in the  office.  Have her spot check her pressure.  Follow.           Einar Pheasant, MD

## 2020-04-13 ENCOUNTER — Encounter: Payer: Self-pay | Admitting: Internal Medicine

## 2020-04-13 NOTE — Assessment & Plan Note (Signed)
Slightly elevation here in the office.  Have her spot check her pressure.  Follow.

## 2020-04-13 NOTE — Assessment & Plan Note (Signed)
Follow cbc.  

## 2020-04-13 NOTE — Assessment & Plan Note (Addendum)
On atorvastatin.  Low cholesterol diet and exercise.  Follow lipid panel and liver function tests.   Lab Results  Component Value Date   CHOL 167 04/02/2020   HDL 69.80 04/02/2020   LDLCALC 70 04/02/2020   LDLDIRECT 90.0 01/15/2019   TRIG 138.0 04/02/2020   CHOLHDL 2 04/02/2020

## 2020-04-13 NOTE — Assessment & Plan Note (Signed)
Discussed with her regarding the need to quit smoking.  She declines to quit at this time.  Follow.   

## 2020-04-13 NOTE — Assessment & Plan Note (Signed)
Overall appears to be handling things well.  Follow.  ?

## 2020-05-30 ENCOUNTER — Encounter: Payer: Self-pay | Admitting: *Deleted

## 2020-08-05 ENCOUNTER — Other Ambulatory Visit: Payer: PPO

## 2020-08-07 ENCOUNTER — Other Ambulatory Visit: Payer: Self-pay

## 2020-08-07 ENCOUNTER — Ambulatory Visit (INDEPENDENT_AMBULATORY_CARE_PROVIDER_SITE_OTHER): Payer: PPO | Admitting: Internal Medicine

## 2020-08-07 VITALS — BP 128/80 | HR 77 | Temp 97.8°F | Resp 16 | Ht 63.0 in | Wt 151.8 lb

## 2020-08-07 DIAGNOSIS — Z720 Tobacco use: Secondary | ICD-10-CM

## 2020-08-07 DIAGNOSIS — F439 Reaction to severe stress, unspecified: Secondary | ICD-10-CM | POA: Diagnosis not present

## 2020-08-07 DIAGNOSIS — Z1159 Encounter for screening for other viral diseases: Secondary | ICD-10-CM | POA: Diagnosis not present

## 2020-08-07 DIAGNOSIS — Z1231 Encounter for screening mammogram for malignant neoplasm of breast: Secondary | ICD-10-CM | POA: Diagnosis not present

## 2020-08-07 DIAGNOSIS — E7800 Pure hypercholesterolemia, unspecified: Secondary | ICD-10-CM

## 2020-08-07 DIAGNOSIS — D75839 Thrombocytosis, unspecified: Secondary | ICD-10-CM

## 2020-08-07 LAB — LIPID PANEL
Cholesterol: 180 mg/dL (ref 0–200)
HDL: 79.9 mg/dL (ref >39.00)
LDL Cholesterol: 70 mg/dL (ref 0–99)
NonHDL: 100.1
Total CHOL/HDL Ratio: 2
Triglycerides: 152 mg/dL — ABNORMAL HIGH (ref 0.0–149.0)
VLDL: 30.4 mg/dL (ref 0.0–40.0)

## 2020-08-07 LAB — HEPATIC FUNCTION PANEL
ALT: 18 U/L (ref 0–35)
AST: 18 U/L (ref 0–37)
Albumin: 4.6 g/dL (ref 3.5–5.2)
Alkaline Phosphatase: 86 U/L (ref 39–117)
Bilirubin, Direct: 0.1 mg/dL (ref 0.0–0.3)
Total Bilirubin: 0.6 mg/dL (ref 0.2–1.2)
Total Protein: 6.9 g/dL (ref 6.0–8.3)

## 2020-08-07 LAB — TSH: TSH: 2.63 u[IU]/mL (ref 0.35–4.50)

## 2020-08-07 LAB — BASIC METABOLIC PANEL
BUN: 15 mg/dL (ref 6–23)
CO2: 28 mEq/L (ref 19–32)
Calcium: 10 mg/dL (ref 8.4–10.5)
Chloride: 105 mEq/L (ref 96–112)
Creatinine, Ser: 0.72 mg/dL (ref 0.40–1.20)
GFR: 81.18 mL/min (ref >60.00)
Glucose, Bld: 79 mg/dL (ref 70–99)
Potassium: 4.5 mEq/L (ref 3.5–5.1)
Sodium: 138 mEq/L (ref 135–145)

## 2020-08-07 NOTE — Progress Notes (Signed)
[name], female   DOB: [birth year], 75 y.o.   MRN: [id number]   [name], female    DOB: [birth year], 75 y.o.   MRN: [id number]  HPI This visit occurred during the SARS-CoV-2 public health emergency.  Safety protocols were in place, including screening questions prior to the visit, additional usage of staff PPE, and extensive cleaning of exam room while observing appropriate contact time as indicated for disinfecting solutions.  Patient here for a scheduled follow up.  Here to follow up regarding her cholesterol.  She is doing well.  Feels good.  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  No chest pain or sob.  No acid reflux.  No abdominal pain or bowel change.  Continues to smoke.  Discussed the need to quit smoking.  Discussed screening CT chest.  She declines.    Past Medical History:  Diagnosis Date  . Chronic bronchitis ([place])   . Hypercholesterolemia   . Migraines    Past Surgical History:  Procedure Laterality Date  . ABDOMINAL HYSTERECTOMY  1979   secondary to fibroids, incidental appendectomy  . BREAST BIOPSY Right 1979   benign  . TONSILLECTOMY  1960   Family History  Problem Relation Age of Onset  . Heart disease Father        myocardial infarction - died age 28  . Heart disease Mother   . Diabetes Mother   . Diabetes Maternal Grandmother   . Breast cancer Neg Hx   . Colon cancer Neg Hx    Social History   Socioeconomic History  . Marital status: Married    Spouse name: Not on file  . Number of children: 1  . Years of education: Not on file  . Highest education level: Not on file  Occupational History  . Not on file  Tobacco Use  . Smoking status: Current Every Day Smoker    Packs/day: 1.00    Years: 50.00    Pack years: 50.00    Types: Cigarettes  . Smokeless tobacco: Never Used  . Tobacco comment: is going to try electronic cigarettes  Vaping Use  . Vaping Use: Never used   Substance and Sexual Activity  . Alcohol use: No    Alcohol/week: 0.0 standard drinks  . Drug use: No  . Sexual activity: Not Currently  Other Topics Concern  . Not on file  Social History Narrative  . Not on file   Social Determinants of Health   Financial Resource Strain: Not on file  Food Insecurity: No Food Insecurity  . Worried About Charity fundraiser in the Last Year: Never true  . Ran Out of Food in the Last Year: Never true  Transportation Needs: No Transportation Needs  . Lack of Transportation (Medical): No  . Lack of Transportation (Non-Medical): No  Physical Activity: Not on file  Stress: No Stress Concern Present  . Feeling of Stress : Not at all  Social Connections: Socially Integrated  . Frequency of Communication with Friends and Family: More than three times a week  . Frequency of Social Gatherings with Friends and Family: More than three times a week  . Attends Religious Services: More than 4 times per year  . Active Member of Clubs or Organizations: Yes  . Attends Archivist Meetings: 1 to 4 times per year  . Marital Status: Married    Outpatient Encounter Medications as of 08/07/2020  Medication Sig  .  atorvastatin (LIPITOR) 20 MG tablet Take 1 tablet (20 mg total) by mouth daily.  . Ibuprofen (ADVIL) 200 MG CAPS Take by mouth.  Marland Kitchen ipratropium (ATROVENT) 0.03 % nasal spray Place 1 spray into both nostrils every 12 (twelve) hours.   No facility-administered encounter medications on file as of 08/07/2020.    Review of Systems  Constitutional: Negative for appetite change and unexpected weight change.  HENT: Negative for congestion and sinus pressure.   Respiratory: Negative for cough, chest tightness and shortness of breath.   Cardiovascular: Negative for chest pain, palpitations and leg swelling.  Gastrointestinal: Negative for abdominal pain, diarrhea, nausea and vomiting.  Genitourinary: Negative for difficulty urinating and dysuria.   Musculoskeletal: Negative for joint swelling and myalgias.  Skin: Negative for color change and rash.  Neurological: Negative for dizziness, light-headedness and headaches.  Psychiatric/Behavioral: Negative for agitation and dysphoric mood.       Objective:    Physical Exam Vitals reviewed.  Constitutional:      General: She is not in acute distress.    Appearance: Normal appearance.  HENT:     Head: Normocephalic and atraumatic.     Right Ear: External ear normal.     Left Ear: External ear normal.     Mouth/Throat:     Mouth: Oropharynx is clear and moist.  Eyes:     General: No scleral icterus.       Right eye: No discharge.        Left eye: No discharge.     Conjunctiva/sclera: Conjunctivae normal.  Neck:     Thyroid: No thyromegaly.  Cardiovascular:     Rate and Rhythm: Normal rate and regular rhythm.  Pulmonary:     Effort: No respiratory distress.     Breath sounds: Normal breath sounds. No wheezing.  Abdominal:     General: Bowel sounds are normal.     Palpations: Abdomen is soft.     Tenderness: There is no abdominal tenderness.  Musculoskeletal:        General: No swelling, tenderness or edema.     Cervical back: Neck supple. No tenderness.  Lymphadenopathy:     Cervical: No cervical adenopathy.  Skin:    Findings: No erythema or rash.  Neurological:     Mental Status: She is alert.  Psychiatric:        Mood and Affect: Mood normal.        Behavior: Behavior normal.     BP 128/80   Pulse 77   Temp 97.8 F (36.6 C) (Oral)   Resp 16   Ht 5\' 3"  (1.6 m)   Wt 151 lb 12.8 oz (68.9 kg)   SpO2 98%   BMI 26.89 kg/m  Wt Readings from Last 3 Encounters:  08/07/20 151 lb 12.8 oz (68.9 kg)  04/07/20 148 lb (67.1 kg)  02/04/20 146 lb 6.4 oz (66.4 kg)     Lab Results  Component Value Date   WBC 8.7 02/04/2020   HGB 13.8 02/04/2020   HCT 40.5 02/04/2020   PLT 374.0 02/04/2020   GLUCOSE 79 08/07/2020   CHOL 180 08/07/2020   TRIG 152.0 (H)  08/07/2020   HDL 79.90 08/07/2020   LDLDIRECT 90.0 01/15/2019   LDLCALC 70 08/07/2020   ALT 18 08/07/2020   AST 18 08/07/2020   NA 138 08/07/2020   K 4.5 08/07/2020   CL 105 08/07/2020   CREATININE 0.72 08/07/2020   BUN 15 08/07/2020   CO2 28 08/07/2020   TSH  2.63 08/07/2020   MICROALBUR 0.7 01/24/2017    MM 3D SCREEN BREAST BILATERAL  Result Date: 10/23/2019 CLINICAL DATA:  Screening. EXAM: DIGITAL SCREENING BILATERAL MAMMOGRAM WITH TOMO AND CAD COMPARISON:  Previous exam(s). ACR Breast Density Category b: There are scattered areas of fibroglandular density. FINDINGS: There are no findings suspicious for malignancy. Images were processed with CAD. IMPRESSION: No mammographic evidence of malignancy. A result letter of this screening mammogram will be mailed directly to the patient. RECOMMENDATION: Screening mammogram in one year. (Code:SM-B-01Y) BI-RADS CATEGORY  1: Negative. Electronically Signed   By: Margarette Canada M.D.   On: 10/23/2019 10:13       Assessment & Plan:   Problem List Items Addressed This Visit    Hypercholesterolemia    Continue lipitor.  Low cholesterol diet and exercise.  Follow lipid panel and liver function tests.        Stress    Appears to be handling things well.  Follow.       Thrombocytosis ([place])    Was worked up by hematology.  Follow cbc.       Tobacco abuse    Discussed with her today.  Discussed the need to quit smoking.  She declines to quit at this time.  Follow.         Other Visit Diagnoses    Encounter for screening mammogram for malignant neoplasm of breast    -  Primary   Relevant Orders   MM 3D SCREEN BREAST BILATERAL   Need for hepatitis C screening test       Relevant Orders   Hepatitis C antibody (Completed)       Einar Pheasant, MD

## 2020-08-08 LAB — HEPATITIS C ANTIBODY
Hepatitis C Ab: NONREACTIVE
SIGNAL TO CUT-OFF: 0 (ref <1.00)

## 2020-08-10 ENCOUNTER — Encounter: Payer: Self-pay | Admitting: Internal Medicine

## 2020-08-10 NOTE — Assessment & Plan Note (Signed)
Was worked up by hematology.  Follow cbc.

## 2020-08-10 NOTE — Assessment & Plan Note (Signed)
Appears to be handling things well.  Follow.  

## 2020-08-10 NOTE — Assessment & Plan Note (Signed)
Discussed with her today.  Discussed the need to quit smoking.  She declines to quit at this time.  Follow.

## 2020-08-10 NOTE — Assessment & Plan Note (Signed)
Continue lipitor.  Low cholesterol diet and exercise.  Follow lipid panel and liver function tests.   

## 2020-10-29 ENCOUNTER — Other Ambulatory Visit: Payer: Self-pay

## 2020-10-29 ENCOUNTER — Ambulatory Visit
Admission: RE | Admit: 2020-10-29 | Discharge: 2020-10-29 | Disposition: A | Discharge status: Home / Self Care | Payer: PPO | Source: Ambulatory Visit | Attending: Internal Medicine | Admitting: Internal Medicine

## 2020-10-29 DIAGNOSIS — Z1231 Encounter for screening mammogram for malignant neoplasm of breast: Secondary | ICD-10-CM | POA: Insufficient documentation

## 2020-12-08 ENCOUNTER — Other Ambulatory Visit: Payer: Self-pay

## 2020-12-08 ENCOUNTER — Encounter: Payer: Self-pay | Admitting: Internal Medicine

## 2020-12-08 ENCOUNTER — Ambulatory Visit (INDEPENDENT_AMBULATORY_CARE_PROVIDER_SITE_OTHER): Payer: PPO | Admitting: Internal Medicine

## 2020-12-08 VITALS — BP 148/98 | HR 52 | Temp 97.9°F | Resp 16 | Ht 63.0 in | Wt 152.0 lb

## 2020-12-08 DIAGNOSIS — E7800 Pure hypercholesterolemia, unspecified: Secondary | ICD-10-CM

## 2020-12-08 DIAGNOSIS — F439 Reaction to severe stress, unspecified: Secondary | ICD-10-CM

## 2020-12-08 DIAGNOSIS — Z720 Tobacco use: Secondary | ICD-10-CM | POA: Diagnosis not present

## 2020-12-08 DIAGNOSIS — D75839 Thrombocytosis, unspecified: Secondary | ICD-10-CM | POA: Diagnosis not present

## 2020-12-08 DIAGNOSIS — R030 Elevated blood-pressure reading, without diagnosis of hypertension: Secondary | ICD-10-CM | POA: Diagnosis not present

## 2020-12-08 LAB — BASIC METABOLIC PANEL
BUN: 15 mg/dL (ref 6–23)
CO2: 27 mEq/L (ref 19–32)
Calcium: 9.6 mg/dL (ref 8.4–10.5)
Chloride: 104 mEq/L (ref 96–112)
Creatinine, Ser: 0.75 mg/dL (ref 0.40–1.20)
GFR: 77.12 mL/min (ref >60.00)
Glucose, Bld: 96 mg/dL (ref 70–99)
Potassium: 4.9 mEq/L (ref 3.5–5.1)
Sodium: 140 mEq/L (ref 135–145)

## 2020-12-08 LAB — LIPID PANEL
Cholesterol: 175 mg/dL (ref 0–200)
HDL: 69.4 mg/dL (ref >39.00)
NonHDL: 105.81
Total CHOL/HDL Ratio: 3
Triglycerides: 208 mg/dL — ABNORMAL HIGH (ref 0.0–149.0)
VLDL: 41.6 mg/dL — ABNORMAL HIGH (ref 0.0–40.0)

## 2020-12-08 LAB — CBC WITH DIFFERENTIAL/PLATELET
Basophils Absolute: 0 10*3/uL (ref 0.0–0.1)
Basophils Relative: 0.5 % (ref 0.0–3.0)
Eosinophils Absolute: 0.2 10*3/uL (ref 0.0–0.7)
Eosinophils Relative: 2.5 % (ref 0.0–5.0)
HCT: 39.6 % (ref 36.0–46.0)
Hemoglobin: 13.4 g/dL (ref 12.0–15.0)
Lymphocytes Relative: 28 % (ref 12.0–46.0)
Lymphs Abs: 2.3 10*3/uL (ref 0.7–4.0)
MCHC: 33.9 g/dL (ref 30.0–36.0)
MCV: 94.9 fl (ref 78.0–100.0)
Monocytes Absolute: 0.6 10*3/uL (ref 0.1–1.0)
Monocytes Relative: 8 % (ref 3.0–12.0)
Neutro Abs: 4.9 10*3/uL (ref 1.4–7.7)
Neutrophils Relative %: 61 % (ref 43.0–77.0)
Platelets: 392 10*3/uL (ref 150.0–400.0)
RBC: 4.18 Mil/uL (ref 3.87–5.11)
RDW: 13.2 % (ref 11.5–15.5)
WBC: 8.1 10*3/uL (ref 4.0–10.5)

## 2020-12-08 LAB — HEPATIC FUNCTION PANEL
ALT: 14 U/L (ref 0–35)
AST: 16 U/L (ref 0–37)
Albumin: 4.4 g/dL (ref 3.5–5.2)
Alkaline Phosphatase: 83 U/L (ref 39–117)
Bilirubin, Direct: 0.1 mg/dL (ref 0.0–0.3)
Total Bilirubin: 0.5 mg/dL (ref 0.2–1.2)
Total Protein: 6.7 g/dL (ref 6.0–8.3)

## 2020-12-08 LAB — LDL CHOLESTEROL, DIRECT: Direct LDL: 86 mg/dL

## 2020-12-08 NOTE — Progress Notes (Signed)
[name], female   DOB: 1946/03/08, 75 y.o.   MRN: [id number]   [name], female    DOB: [birth year], 75 y.o.   MRN: [id number]  HPI This visit occurred during the SARS-CoV-2 public health emergency.  Safety protocols were in place, including screening questions prior to the visit, additional usage of staff PPE, and extensive cleaning of exam room while observing appropriate contact time as indicated for disinfecting solutions.  Patient here for a scheduled follow up.  Here to follow up regarding her cholesterol.  Also has a history of tobacco abuse. Continues to smoke.  Discussed the need to quit.  Discussed treatment options.  She elects not to quit at this time.  Stays active.  No chest pain or sob reported.  No abdominal pain.  Bowels moving.  No urine change.  Daughter-n-law diagnosed with covid.  States she has not been around her.  Blood pressure averaging 120/60s at home.  Has eye appt scheduled 12/17/20.    Past Medical History:  Diagnosis Date  . Chronic bronchitis ([place])   . Hypercholesterolemia   . Migraines    Past Surgical History:  Procedure Laterality Date  . ABDOMINAL HYSTERECTOMY  1979   secondary to fibroids, incidental appendectomy  . BREAST EXCISIONAL BIOPSY Right 1979   benign  . TONSILLECTOMY  1960   Family History  Problem Relation Age of Onset  . Heart disease Father        myocardial infarction - died age 77  . Heart disease Mother   . Diabetes Mother   . Diabetes Maternal Grandmother   . Breast cancer Neg Hx   . Colon cancer Neg Hx    Social History   Socioeconomic History  . Marital status: Married    Spouse name: Not on file  . Number of children: 1  . Years of education: Not on file  . Highest education level: Not on file  Occupational History  . Not on file  Tobacco Use  . Smoking status: Current Every Day Smoker    Packs/day: 1.00    Years: 50.00    Pack years: 50.00    Types:  Cigarettes  . Smokeless tobacco: Never Used  . Tobacco comment: is going to try electronic cigarettes  Vaping Use  . Vaping Use: Never used  Substance and Sexual Activity  . Alcohol use: No    Alcohol/week: 0.0 standard drinks  . Drug use: No  . Sexual activity: Not Currently  Other Topics Concern  . Not on file  Social History Narrative  . Not on file   Social Determinants of Health   Financial Resource Strain: Not on file  Food Insecurity: No Food Insecurity  . Worried About Charity fundraiser in the Last Year: Never true  . Ran Out of Food in the Last Year: Never true  Transportation Needs: No Transportation Needs  . Lack of Transportation (Medical): No  . Lack of Transportation (Non-Medical): No  Physical Activity: Not on file  Stress: No Stress Concern Present  . Feeling of Stress : Not at all  Social Connections: Socially Integrated  . Frequency of Communication with Friends and Family: More than three times a week  . Frequency of Social Gatherings with Friends and Family: More than three times a week  . Attends Religious Services: More than 4 times per year  . Active Member of Clubs or Organizations: Yes  . Attends Archivist  Meetings: 1 to 4 times per year  . Marital Status: Married    Outpatient Encounter Medications as of 12/08/2020  Medication Sig  . atorvastatin (LIPITOR) 20 MG tablet Take 1 tablet (20 mg total) by mouth daily.  . Ibuprofen 200 MG CAPS Take by mouth.  Marland Kitchen ipratropium (ATROVENT) 0.03 % nasal spray Place 1 spray into both nostrils every 12 (twelve) hours.   No facility-administered encounter medications on file as of 12/08/2020.    Review of Systems  Constitutional: Negative for appetite change and unexpected weight change.  HENT: Negative for congestion and sinus pressure.   Respiratory: Negative for cough and chest tightness.        No increased sob.  Breathing stable.   Cardiovascular: Negative for chest pain, palpitations and leg  swelling.  Gastrointestinal: Negative for abdominal pain, diarrhea, nausea and vomiting.  Genitourinary: Negative for difficulty urinating and dysuria.  Musculoskeletal: Negative for joint swelling and myalgias.  Skin: Negative for color change and rash.  Neurological: Negative for dizziness, light-headedness and headaches.  Psychiatric/Behavioral: Negative for agitation and dysphoric mood.       Objective:    Physical Exam Vitals reviewed.  Constitutional:      General: She is not in acute distress.    Appearance: Normal appearance.  HENT:     Head: Normocephalic and atraumatic.     Right Ear: External ear normal.     Left Ear: External ear normal.  Eyes:     General: No scleral icterus.       Right eye: No discharge.        Left eye: No discharge.     Conjunctiva/sclera: Conjunctivae normal.  Neck:     Thyroid: No thyromegaly.  Cardiovascular:     Rate and Rhythm: Normal rate and regular rhythm.  Pulmonary:     Effort: No respiratory distress.     Breath sounds: Normal breath sounds. No wheezing.  Abdominal:     General: Bowel sounds are normal.     Palpations: Abdomen is soft.     Tenderness: There is no abdominal tenderness.  Musculoskeletal:        General: No swelling or tenderness.     Cervical back: Neck supple. No tenderness.  Lymphadenopathy:     Cervical: No cervical adenopathy.  Skin:    Findings: No erythema or rash.  Neurological:     Mental Status: She is alert.  Psychiatric:        Mood and Affect: Mood normal.        Behavior: Behavior normal.     BP (!) 148/98   Pulse (!) 52   Temp 97.9 F (36.6 C)   Resp 16   Ht 5\' 3"  (1.6 m)   Wt 152 lb (68.9 kg)   SpO2 99%   BMI 26.93 kg/m  Wt Readings from Last 3 Encounters:  12/08/20 152 lb (68.9 kg)  08/07/20 151 lb 12.8 oz (68.9 kg)  04/07/20 148 lb (67.1 kg)     Lab Results  Component Value Date   WBC 8.1 12/08/2020   HGB 13.4 12/08/2020   HCT 39.6 12/08/2020   PLT 392.0 12/08/2020    GLUCOSE 96 12/08/2020   CHOL 175 12/08/2020   TRIG 208.0 (H) 12/08/2020   HDL 69.40 12/08/2020   LDLDIRECT 86.0 12/08/2020   LDLCALC 70 08/07/2020   ALT 14 12/08/2020   AST 16 12/08/2020   NA 140 12/08/2020   K 4.9 12/08/2020   CL 104 12/08/2020  CREATININE 0.75 12/08/2020   BUN 15 12/08/2020   CO2 27 12/08/2020   TSH 2.63 08/07/2020   MICROALBUR 0.7 01/24/2017    MM 3D SCREEN BREAST BILATERAL  Result Date: 10/29/2020 CLINICAL DATA:  Screening. EXAM: DIGITAL SCREENING BILATERAL MAMMOGRAM WITH TOMOSYNTHESIS AND CAD TECHNIQUE: Bilateral screening digital craniocaudal and mediolateral oblique mammograms were obtained. Bilateral screening digital breast tomosynthesis was performed. The images were evaluated with computer-aided detection. COMPARISON:  Previous exam(s). ACR Breast Density Category b: There are scattered areas of fibroglandular density. FINDINGS: There are no findings suspicious for malignancy. The images were evaluated with computer-aided detection. IMPRESSION: No mammographic evidence of malignancy. A result letter of this screening mammogram will be mailed directly to the patient. RECOMMENDATION: Screening mammogram in one year. (Code:SM-B-01Y) BI-RADS CATEGORY  1: Negative. Electronically Signed   By: Margarette Canada M.D.   On: 10/29/2020 15:36       Assessment & Plan:   Problem List Items Addressed This Visit    Elevated blood pressure reading    Slightly elevated here in the office.  Discussed medication.  She declines.  Wants to follow.  Home readings improved.  Spot check pressure and send in readings.  Bring cuff to next appt.  F/u soon to reassess.        Hypercholesterolemia - Primary    Continue lipitor.  Low cholesterol diet and exercise.  Follow lipid panel and liver function tests.        Relevant Orders   Basic metabolic panel (Completed)   Hepatic function panel (Completed)   Lipid panel (Completed)   Stress    Overall appears to be handling things  well.  Follow.        Thrombocytosis ([place])    Worked up by hematology.  Recommended following cbc.  Follow cbc.           Relevant Orders   CBC with Differential/Platelet (Completed)   Tobacco abuse    Discussed the need to quit smoking.  Also discussed screening CT chest.  She declines.  Will notify me if changes her mind.            Einar Pheasant, MD

## 2020-12-15 ENCOUNTER — Encounter: Payer: Self-pay | Admitting: Internal Medicine

## 2020-12-15 NOTE — Assessment & Plan Note (Signed)
Overall appears to be handling things well.  Follow.  ?

## 2020-12-15 NOTE — Assessment & Plan Note (Signed)
Discussed the need to quit smoking.  Also discussed screening CT chest.  She declines.  Will notify me if changes her mind.

## 2020-12-15 NOTE — Assessment & Plan Note (Signed)
Worked up by hematology.  Recommended following cbc.  Follow cbc.

## 2020-12-15 NOTE — Assessment & Plan Note (Signed)
Slightly elevated here in the office.  Discussed medication.  She declines.  Wants to follow.  Home readings improved.  Spot check pressure and send in readings.  Bring cuff to next appt.  F/u soon to reassess.

## 2020-12-15 NOTE — Assessment & Plan Note (Signed)
Continue lipitor.  Low cholesterol diet and exercise.  Follow lipid panel and liver function tests.   

## 2020-12-17 DIAGNOSIS — H2513 Age-related nuclear cataract, bilateral: Secondary | ICD-10-CM | POA: Diagnosis not present

## 2020-12-29 ENCOUNTER — Ambulatory Visit: Payer: PPO

## 2021-01-13 ENCOUNTER — Telehealth: Payer: Self-pay

## 2021-01-13 ENCOUNTER — Ambulatory Visit: Payer: PPO

## 2021-01-13 NOTE — Telephone Encounter (Signed)
Patient request rescheduling of awv appointment via telephone. Rescheduled as appropriate.

## 2021-03-11 ENCOUNTER — Ambulatory Visit: Payer: PPO

## 2021-04-14 ENCOUNTER — Encounter: Payer: Self-pay | Admitting: Internal Medicine

## 2021-04-14 ENCOUNTER — Other Ambulatory Visit: Payer: Self-pay

## 2021-04-14 ENCOUNTER — Ambulatory Visit (INDEPENDENT_AMBULATORY_CARE_PROVIDER_SITE_OTHER): Payer: PPO | Admitting: Internal Medicine

## 2021-04-14 VITALS — BP 128/78 | HR 78 | Temp 96.4°F | Ht 63.0 in | Wt 149.0 lb

## 2021-04-14 DIAGNOSIS — E7800 Pure hypercholesterolemia, unspecified: Secondary | ICD-10-CM

## 2021-04-14 DIAGNOSIS — Z720 Tobacco use: Secondary | ICD-10-CM

## 2021-04-14 DIAGNOSIS — F439 Reaction to severe stress, unspecified: Secondary | ICD-10-CM | POA: Diagnosis not present

## 2021-04-14 DIAGNOSIS — Z13820 Encounter for screening for osteoporosis: Secondary | ICD-10-CM | POA: Diagnosis not present

## 2021-04-14 DIAGNOSIS — D75839 Thrombocytosis, unspecified: Secondary | ICD-10-CM | POA: Diagnosis not present

## 2021-04-14 DIAGNOSIS — R030 Elevated blood-pressure reading, without diagnosis of hypertension: Secondary | ICD-10-CM

## 2021-04-14 DIAGNOSIS — Z0000 Encounter for general adult medical examination without abnormal findings: Secondary | ICD-10-CM

## 2021-04-14 LAB — LIPID PANEL
Cholesterol: 177 mg/dL (ref 0–200)
HDL: 67.2 mg/dL (ref >39.00)
NonHDL: 109.94
Total CHOL/HDL Ratio: 3
Triglycerides: 258 mg/dL — ABNORMAL HIGH (ref 0.0–149.0)
VLDL: 51.6 mg/dL — ABNORMAL HIGH (ref 0.0–40.0)

## 2021-04-14 LAB — BASIC METABOLIC PANEL
BUN: 19 mg/dL (ref 6–23)
CO2: 27 mEq/L (ref 19–32)
Calcium: 9.4 mg/dL (ref 8.4–10.5)
Chloride: 104 mEq/L (ref 96–112)
Creatinine, Ser: 0.74 mg/dL (ref 0.40–1.20)
GFR: 78.18 mL/min (ref >60.00)
Glucose, Bld: 78 mg/dL (ref 70–99)
Potassium: 4.4 mEq/L (ref 3.5–5.1)
Sodium: 139 mEq/L (ref 135–145)

## 2021-04-14 LAB — HEPATIC FUNCTION PANEL
ALT: 15 U/L (ref 0–35)
AST: 15 U/L (ref 0–37)
Albumin: 4.5 g/dL (ref 3.5–5.2)
Alkaline Phosphatase: 90 U/L (ref 39–117)
Bilirubin, Direct: 0.1 mg/dL (ref 0.0–0.3)
Total Bilirubin: 0.6 mg/dL (ref 0.2–1.2)
Total Protein: 6.8 g/dL (ref 6.0–8.3)

## 2021-04-14 LAB — LDL CHOLESTEROL, DIRECT: Direct LDL: 81 mg/dL

## 2021-04-14 NOTE — Progress Notes (Signed)
[name], female   DOB: [birth year], 75 y.o.   MRN: [id number]   [name], female    DOB: [birth year], 75 y.o.   MRN: [id number]  This visit occurred during the SARS-CoV-2 public health emergency.  Safety protocols were in place, including screening questions prior to the visit, additional usage of staff PPE, and extensive cleaning of exam room while observing appropriate contact time as indicated for disinfecting solutions.   Patient here for her physical exam.   Chief Complaint  Patient presents with   Annual Exam    Physical    .   HPI Increased stress.  Discussed..  Overall she feels she is handling things relatively well.  Does not feel she needs any further intervention at this time.  No chest pain or sob reported.  No abdominal pain.  Bowels moving.  Discussed blood pressure.  Discussed cholesterol.     Past Medical History:  Diagnosis Date   Chronic bronchitis ([place])    Hypercholesterolemia    Migraines    Past Surgical History:  Procedure Laterality Date   ABDOMINAL HYSTERECTOMY  1979   secondary to fibroids, incidental appendectomy   BREAST EXCISIONAL BIOPSY Right 1979   benign   TONSILLECTOMY  1960   Family History  Problem Relation Age of Onset   Heart disease Father        myocardial infarction - died age 71   Heart disease Mother    Diabetes Mother    Diabetes Maternal Grandmother    Breast cancer Neg Hx    Colon cancer Neg Hx    Social History   Socioeconomic History   Marital status: Married    Spouse name: Not on file   Number of children: 1   Years of education: Not on file   Highest education level: Not on file  Occupational History   Not on file  Tobacco Use   Smoking status: Every Day    Packs/day: 1.00    Years: 50.00    Pack years: 50.00    Types: Cigarettes   Smokeless tobacco: Never   Tobacco comments:    is going to try electronic cigarettes  Vaping Use   Vaping Use: Never used   Substance and Sexual Activity   Alcohol use: No    Alcohol/week: 0.0 standard drinks   Drug use: No   Sexual activity: Not Currently  Other Topics Concern   Not on file  Social History Narrative   Not on file   Social Determinants of Health   Financial Resource Strain: Not on file  Food Insecurity: Not on file  Transportation Needs: Not on file  Physical Activity: Not on file  Stress: Not on file  Social Connections: Not on file     Review of Systems  Constitutional:  Negative for appetite change and unexpected weight change.  HENT:  Negative for congestion and sinus pressure.   Respiratory:  Negative for cough, chest tightness and shortness of breath.   Cardiovascular:  Negative for chest pain, palpitations and leg swelling.  Gastrointestinal:  Negative for abdominal pain, diarrhea, nausea and vomiting.  Genitourinary:  Negative for difficulty urinating and dysuria.  Musculoskeletal:  Negative for joint swelling and myalgias.  Skin:  Negative for color change and rash.  Neurological:  Negative for dizziness, light-headedness and headaches.  Psychiatric/Behavioral:  Negative for agitation and dysphoric mood.       Objective:     BP 128/78  Pulse 78   Temp (!) 96.4 F (35.8 C) (Temporal)   Ht 5\' 3"  (1.6 m)   Wt 149 lb (67.6 kg)   SpO2 98%   BMI 26.39 kg/m  Wt Readings from Last 3 Encounters:  04/14/21 149 lb (67.6 kg)  12/08/20 152 lb (68.9 kg)  08/07/20 151 lb 12.8 oz (68.9 kg)    Physical Exam Vitals reviewed.  Constitutional:      General: She is not in acute distress.    Appearance: Normal appearance.  HENT:     Head: Normocephalic and atraumatic.     Right Ear: External ear normal.     Left Ear: External ear normal.  Eyes:     General: No scleral icterus.       Right eye: No discharge.        Left eye: No discharge.     Conjunctiva/sclera: Conjunctivae normal.  Neck:     Thyroid: No thyromegaly.  Cardiovascular:     Rate and Rhythm: Normal  rate and regular rhythm.  Pulmonary:     Effort: No respiratory distress.     Breath sounds: Normal breath sounds. No wheezing.  Abdominal:     General: Bowel sounds are normal.     Palpations: Abdomen is soft.     Tenderness: There is no abdominal tenderness.  Musculoskeletal:        General: No swelling or tenderness.     Cervical back: Neck supple. No tenderness.  Lymphadenopathy:     Cervical: No cervical adenopathy.  Skin:    Findings: No erythema or rash.  Neurological:     Mental Status: She is alert.  Psychiatric:        Mood and Affect: Mood normal.        Behavior: Behavior normal.     Outpatient Encounter Medications as of 04/14/2021  Medication Sig   atorvastatin (LIPITOR) 20 MG tablet Take 1 tablet (20 mg total) by mouth daily.   Ibuprofen 200 MG CAPS Take by mouth.   ipratropium (ATROVENT) 0.03 % nasal spray Place 1 spray into both nostrils every 12 (twelve) hours.   No facility-administered encounter medications on file as of 04/14/2021.     Lab Results  Component Value Date   WBC 8.1 12/08/2020   HGB 13.4 12/08/2020   HCT 39.6 12/08/2020   PLT 392.0 12/08/2020   GLUCOSE 78 04/14/2021   CHOL 177 04/14/2021   TRIG 258.0 (H) 04/14/2021   HDL 67.20 04/14/2021   LDLDIRECT 81.0 04/14/2021   LDLCALC 70 08/07/2020   ALT 15 04/14/2021   AST 15 04/14/2021   NA 139 04/14/2021   K 4.4 04/14/2021   CL 104 04/14/2021   CREATININE 0.74 04/14/2021   BUN 19 04/14/2021   CO2 27 04/14/2021   TSH 2.63 08/07/2020   MICROALBUR 0.7 01/24/2017    MM 3D SCREEN BREAST BILATERAL  Result Date: 10/29/2020 CLINICAL DATA:  Screening. EXAM: DIGITAL SCREENING BILATERAL MAMMOGRAM WITH TOMOSYNTHESIS AND CAD TECHNIQUE: Bilateral screening digital craniocaudal and mediolateral oblique mammograms were obtained. Bilateral screening digital breast tomosynthesis was performed. The images were evaluated with computer-aided detection. COMPARISON:  Previous exam(s). ACR Breast Density  Category b: There are scattered areas of fibroglandular density. FINDINGS: There are no findings suspicious for malignancy. The images were evaluated with computer-aided detection. IMPRESSION: No mammographic evidence of malignancy. A result letter of this screening mammogram will be mailed directly to the patient. RECOMMENDATION: Screening mammogram in one year. (Code:SM-B-01Y) BI-RADS CATEGORY  1: Negative. Electronically  Signed   By: Margarette Canada M.D.   On: 10/29/2020 15:36       Assessment & Plan:   Problem List Items Addressed This Visit     Elevated blood pressure reading    Elevation here in office.  States blood pressure on outside checks 128/70s.  Discussed medication.  She wants to monitor blood pressure and hold on medication.  Spot check pressure.  Send in readings.  If persistent elevation, will require medication.       Health care maintenance    Physical today 04/14/21.  Mammogram 10/29/20 - [facility] I.  colnoscopy 12/2012 - Diverticulosis.       Hypercholesterolemia - Primary    Continue lipitor.  Low cholesterol diet and exercise.  Follow lipid panel and liver function tests.        Relevant Orders   Hepatic function panel (Completed)   Lipid panel (Completed)   Basic metabolic panel (Completed)   Osteoporosis screening    Discussed bone density screening.  Will plan for bone density with next mammogram.       Stress    Appears to be handling things well.  Follow.       Thrombocytosis ([place])    Worked up by hematology.  Recommended following cbc.  Follow cbc.           Tobacco abuse    Discussed the need to quit smoking.  Also discussed screening CT chest.  She declines.  Will notify me if changes her mind.          Einar Pheasant, MD

## 2021-04-14 NOTE — Assessment & Plan Note (Signed)
Physical today 04/14/21.  Mammogram 10/29/20 - Birads I.  colnoscopy 12/2012 - Diverticulosis.

## 2021-04-15 NOTE — Progress Notes (Signed)
Left message for patient to return call back for lab results.

## 2021-04-19 ENCOUNTER — Encounter: Payer: Self-pay | Admitting: Internal Medicine

## 2021-04-19 DIAGNOSIS — Z13820 Encounter for screening for osteoporosis: Secondary | ICD-10-CM | POA: Insufficient documentation

## 2021-04-19 NOTE — Assessment & Plan Note (Signed)
Worked up by hematology.  Recommended following cbc.  Follow cbc.

## 2021-04-19 NOTE — Assessment & Plan Note (Signed)
Elevation here in office.  States blood pressure on outside checks 128/70s.  Discussed medication.  She wants to monitor blood pressure and hold on medication.  Spot check pressure.  Send in readings.  If persistent elevation, will require medication.

## 2021-04-19 NOTE — Assessment & Plan Note (Signed)
Continue lipitor.  Low cholesterol diet and exercise.  Follow lipid panel and liver function tests.   

## 2021-04-19 NOTE — Assessment & Plan Note (Signed)
Discussed bone density screening.  Will plan for bone density with next mammogram.

## 2021-04-19 NOTE — Assessment & Plan Note (Signed)
Appears to be handling things well.  Follow.  

## 2021-04-19 NOTE — Assessment & Plan Note (Signed)
Discussed the need to quit smoking.  Also discussed screening CT chest.  She declines.  Will notify me if changes her mind.

## 2021-04-22 ENCOUNTER — Other Ambulatory Visit: Payer: Self-pay | Admitting: Internal Medicine

## 2021-05-17 DIAGNOSIS — U071 COVID-19: Secondary | ICD-10-CM | POA: Diagnosis not present

## 2021-05-17 DIAGNOSIS — R070 Pain in throat: Secondary | ICD-10-CM | POA: Diagnosis not present

## 2021-05-17 DIAGNOSIS — Z20822 Contact with and (suspected) exposure to covid-19: Secondary | ICD-10-CM | POA: Diagnosis not present

## 2021-06-21 IMAGING — MG MM DIGITAL SCREENING BILAT W/ TOMO AND CAD
6 of 10 series · 6 of 30 positions shown · non-contrast
Comparison: Previous exam(s).

CLINICAL DATA: Screening.

EXAM:
DIGITAL SCREENING BILATERAL MAMMOGRAM WITH TOMOSYNTHESIS AND CAD
TECHNIQUE: Bilateral screening digital craniocaudal and mediolateral oblique
mammograms were obtained. Bilateral screening digital breast
tomosynthesis was performed. The images were evaluated with
computer-aided detection.

[L MLO synth-2D]
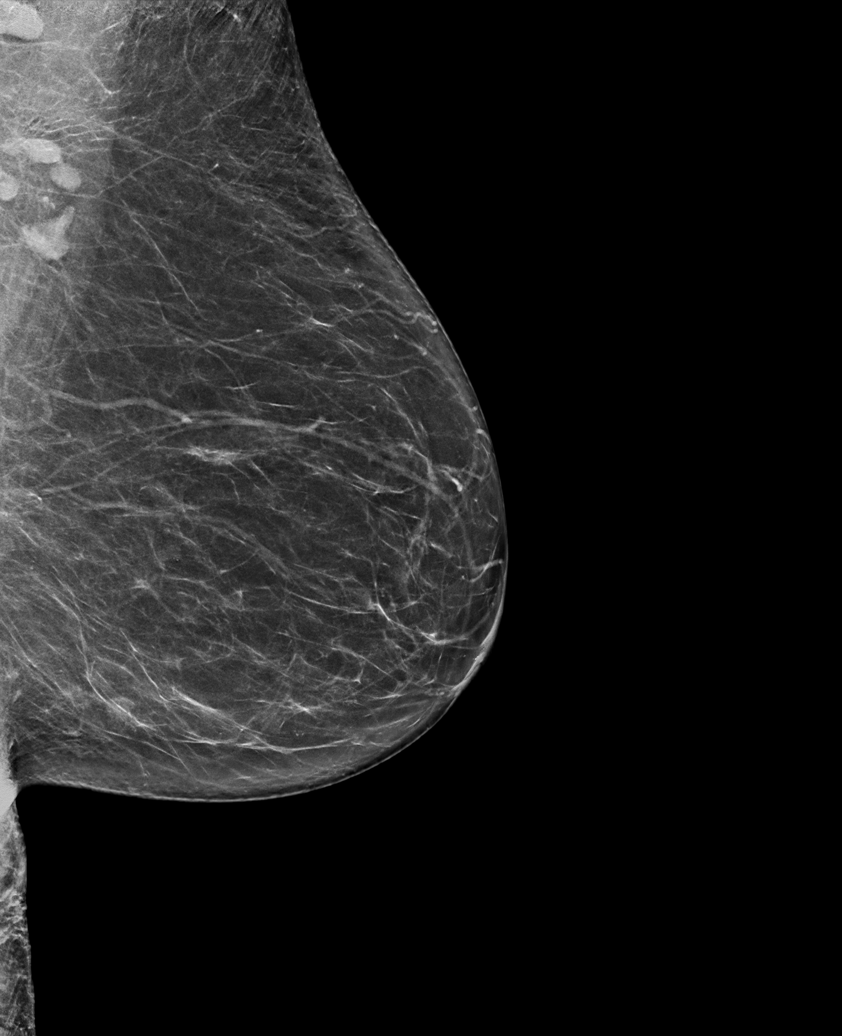

[L CC synth-2D]
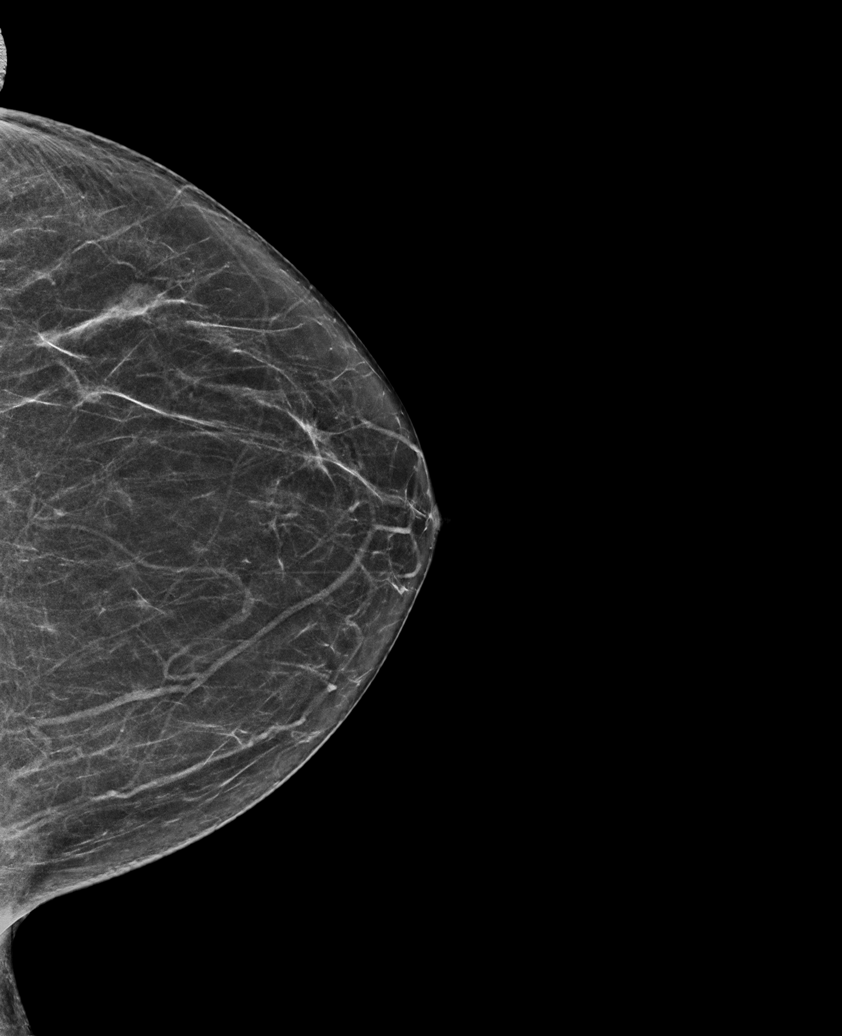

[R MLO synth-2D (1 of 2)]
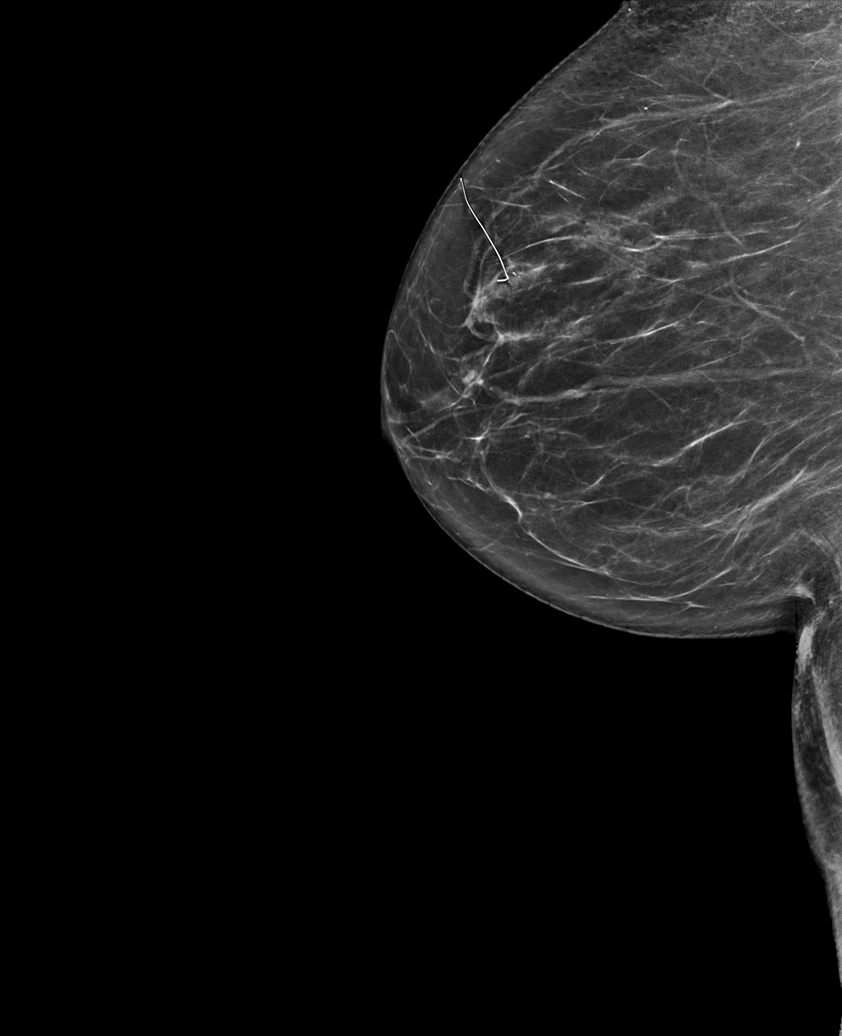

[R CC synth-2D]
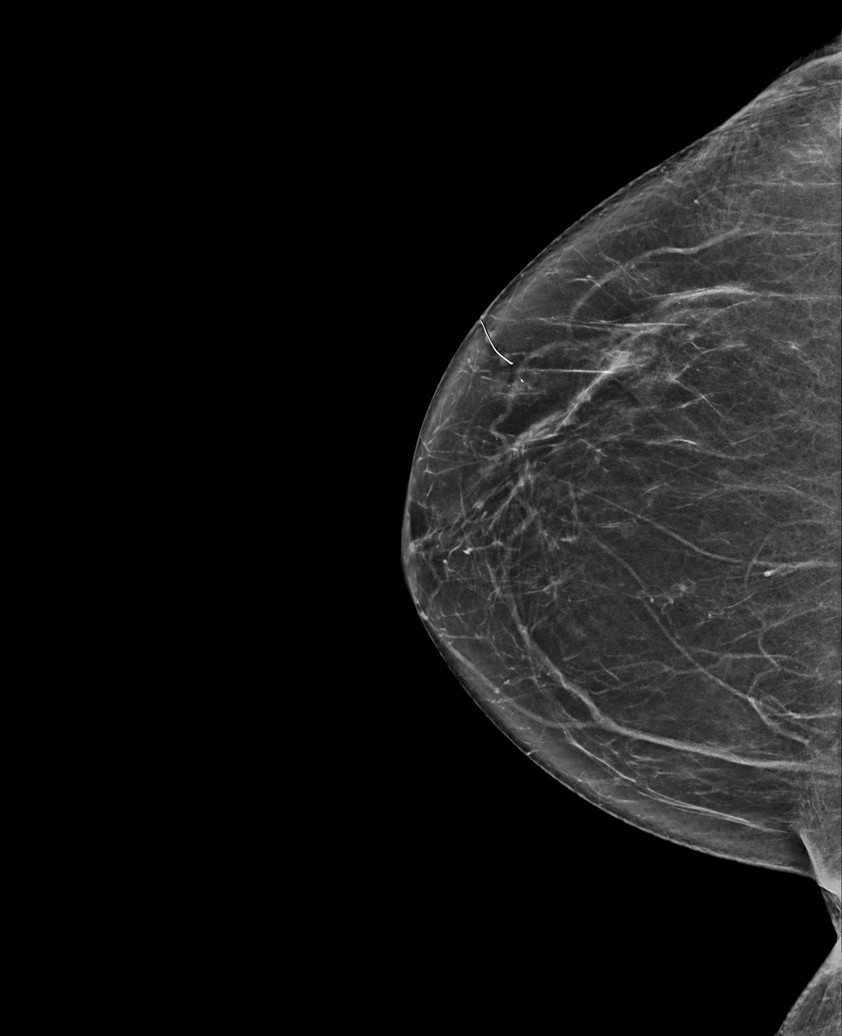

[R MLO synth-2D (2 of 2)]
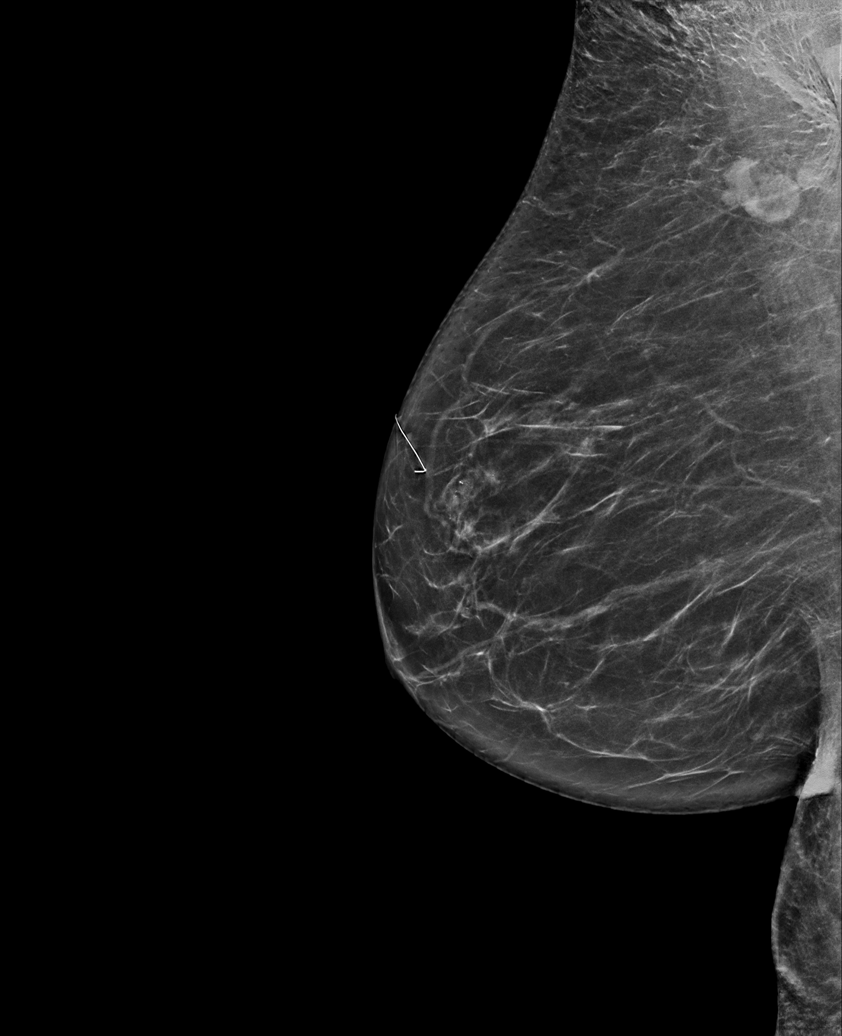

[R CC tomo · tomo slice 35/68.0]
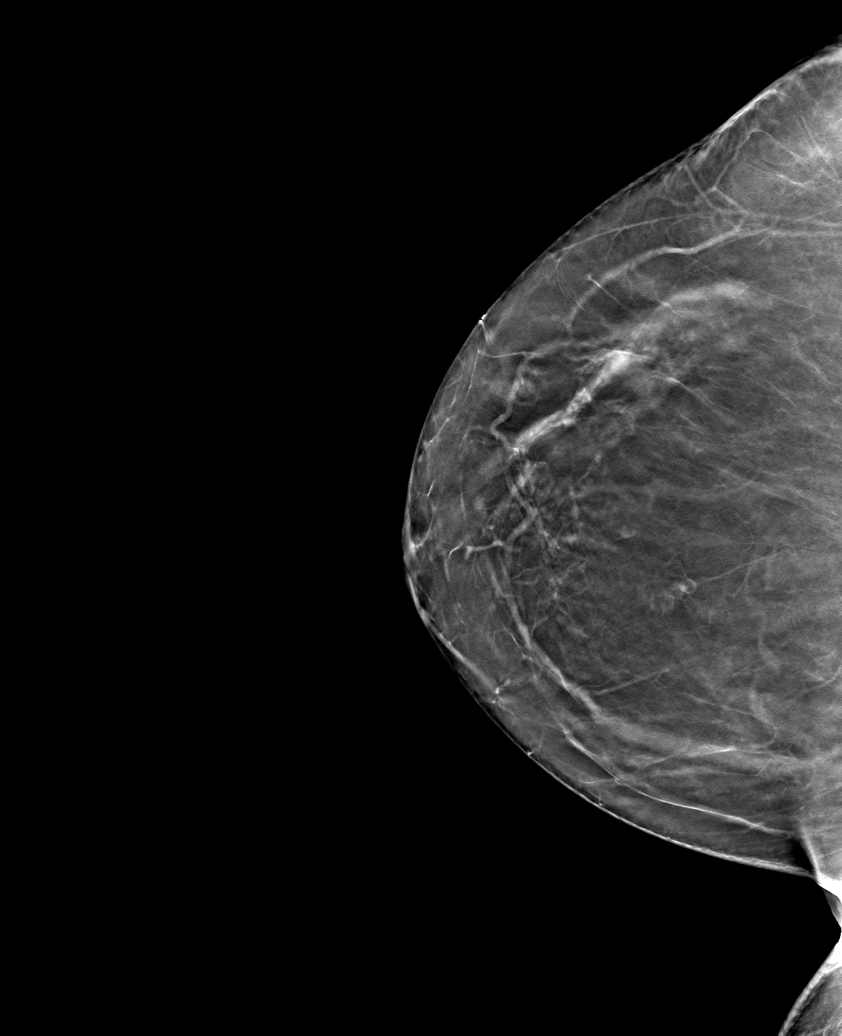

[6 of 30 positions shown; findings below may reference images not displayed]

ACR Breast Density Category b: There are scattered areas of
fibroglandular density.
FINDINGS: There are no findings suspicious for malignancy. The images were
evaluated with computer-aided detection.
IMPRESSION: No mammographic evidence of malignancy. A result letter of this
screening mammogram will be mailed directly to the patient.

RECOMMENDATION:
Screening mammogram in one year. (Code:WJ-I-BG6)

BI-RADS CATEGORY  1: Negative.

## 2021-07-21 ENCOUNTER — Other Ambulatory Visit: Payer: Self-pay | Admitting: Internal Medicine

## 2021-08-11 ENCOUNTER — Ambulatory Visit (INDEPENDENT_AMBULATORY_CARE_PROVIDER_SITE_OTHER): Payer: PPO | Admitting: Internal Medicine

## 2021-08-11 ENCOUNTER — Other Ambulatory Visit: Payer: Self-pay

## 2021-08-11 VITALS — BP 132/70 | HR 79 | Temp 97.4°F | Resp 16 | Ht 63.0 in | Wt 147.8 lb

## 2021-08-11 DIAGNOSIS — R0981 Nasal congestion: Secondary | ICD-10-CM

## 2021-08-11 DIAGNOSIS — E7800 Pure hypercholesterolemia, unspecified: Secondary | ICD-10-CM

## 2021-08-11 DIAGNOSIS — Z720 Tobacco use: Secondary | ICD-10-CM

## 2021-08-11 DIAGNOSIS — D75839 Thrombocytosis, unspecified: Secondary | ICD-10-CM

## 2021-08-11 DIAGNOSIS — F439 Reaction to severe stress, unspecified: Secondary | ICD-10-CM | POA: Diagnosis not present

## 2021-08-11 LAB — LIPID PANEL
Cholesterol: 175 mg/dL (ref 0–200)
HDL: 67 mg/dL (ref >39.00)
NonHDL: 107.65
Total CHOL/HDL Ratio: 3
Triglycerides: 285 mg/dL — ABNORMAL HIGH (ref 0.0–149.0)
VLDL: 57 mg/dL — ABNORMAL HIGH (ref 0.0–40.0)

## 2021-08-11 LAB — BASIC METABOLIC PANEL
BUN: 20 mg/dL (ref 6–23)
CO2: 29 mEq/L (ref 19–32)
Calcium: 9.6 mg/dL (ref 8.4–10.5)
Chloride: 103 mEq/L (ref 96–112)
Creatinine, Ser: 0.84 mg/dL (ref 0.40–1.20)
GFR: 66.99 mL/min (ref >60.00)
Glucose, Bld: 88 mg/dL (ref 70–99)
Potassium: 4.3 mEq/L (ref 3.5–5.1)
Sodium: 140 mEq/L (ref 135–145)

## 2021-08-11 LAB — TSH: TSH: 2.57 u[IU]/mL (ref 0.35–5.50)

## 2021-08-11 LAB — HEPATIC FUNCTION PANEL
ALT: 19 U/L (ref 0–35)
AST: 20 U/L (ref 0–37)
Albumin: 4.4 g/dL (ref 3.5–5.2)
Alkaline Phosphatase: 95 U/L (ref 39–117)
Bilirubin, Direct: 0.1 mg/dL (ref 0.0–0.3)
Total Bilirubin: 0.5 mg/dL (ref 0.2–1.2)
Total Protein: 6.7 g/dL (ref 6.0–8.3)

## 2021-08-11 LAB — LDL CHOLESTEROL, DIRECT: Direct LDL: 80 mg/dL

## 2021-08-11 MED ORDER — AZELASTINE HCL 0.1 % NA SOLN: 1.0000 | Freq: Two times a day (BID) | NASAL | 1 refills | Status: DC

## 2021-08-11 NOTE — Progress Notes (Signed)
[name], female   DOB: [birth year], 76 y.o.   MRN: [id number]   [name], female    DOB: January 04, 1946, 76 y.o.   MRN: [id number]  This visit occurred during the SARS-CoV-2 public health emergency.  Safety protocols were in place, including screening questions prior to the visit, additional usage of staff PPE, and extensive cleaning of exam room while observing appropriate contact time as indicated for disinfecting solutions.   Patient here for a scheduled follow up.   Chief Complaint  Patient presents with   Hyperlipidemia   .   HPI Doing well.  Feels good.  Stress is better.  No chest pain or sob reported.  Previous covid infection. Reports having persistent taste and smell change - not back to normal.  Coffee does not taste well now.  She also reports persistent dripping of her nose.  No increased cough or congestion.  No sinus pressure.  No abdominal pain.  Bowels stable.     Past Medical History:  Diagnosis Date   Chronic bronchitis ([place])    Hypercholesterolemia    Migraines    Past Surgical History:  Procedure Laterality Date   ABDOMINAL HYSTERECTOMY  1979   secondary to fibroids, incidental appendectomy   BREAST EXCISIONAL BIOPSY Right 1979   benign   TONSILLECTOMY  1960   Family History  Problem Relation Age of Onset   Heart disease Father        myocardial infarction - died age 38   Heart disease Mother    Diabetes Mother    Diabetes Maternal Grandmother    Breast cancer Neg Hx    Colon cancer Neg Hx    Social History   Socioeconomic History   Marital status: Married    Spouse name: Not on file   Number of children: 1   Years of education: Not on file   Highest education level: Not on file  Occupational History   Not on file  Tobacco Use   Smoking status: Every Day    Packs/day: 1.00    Years: 50.00    Pack years: 50.00    Types: Cigarettes   Smokeless tobacco: Never   Tobacco comments:    is  going to try electronic cigarettes  Vaping Use   Vaping Use: Never used  Substance and Sexual Activity   Alcohol use: No    Alcohol/week: 0.0 standard drinks   Drug use: No   Sexual activity: Not Currently  Other Topics Concern   Not on file  Social History Narrative   Not on file   Social Determinants of Health   Financial Resource Strain: Not on file  Food Insecurity: Not on file  Transportation Needs: Not on file  Physical Activity: Not on file  Stress: Not on file  Social Connections: Not on file     Review of Systems  Constitutional:  Negative for appetite change and unexpected weight change.  HENT:  Negative for congestion and sinus pressure.   Respiratory:  Negative for cough, chest tightness and shortness of breath.   Cardiovascular:  Negative for chest pain, palpitations and leg swelling.  Gastrointestinal:  Negative for abdominal pain, diarrhea, nausea and vomiting.  Genitourinary:  Negative for difficulty urinating and dysuria.  Musculoskeletal:  Negative for joint swelling and myalgias.  Skin:  Negative for color change and rash.  Neurological:  Negative for dizziness, light-headedness and headaches.  Psychiatric/Behavioral:  Negative for agitation and dysphoric mood.  Objective:     BP 132/70    Pulse 79    Temp (!) 97.4 F (36.3 C)    Resp 16    Wt 147 lb 12.8 oz (67 kg)    SpO2 99%    BMI 26.18 kg/m  Wt Readings from Last 3 Encounters:  08/11/21 147 lb 12.8 oz (67 kg)  04/14/21 149 lb (67.6 kg)  12/08/20 152 lb (68.9 kg)    Physical Exam Vitals reviewed.  Constitutional:      General: She is not in acute distress.    Appearance: Normal appearance.  HENT:     Head: Normocephalic and atraumatic.     Right Ear: External ear normal.     Left Ear: External ear normal.  Eyes:     General: No scleral icterus.       Right eye: No discharge.        Left eye: No discharge.     Conjunctiva/sclera: Conjunctivae normal.  Neck:     Thyroid: No  thyromegaly.  Cardiovascular:     Rate and Rhythm: Normal rate and regular rhythm.  Pulmonary:     Effort: No respiratory distress.     Breath sounds: Normal breath sounds. No wheezing.  Abdominal:     General: Bowel sounds are normal.     Palpations: Abdomen is soft.     Tenderness: There is no abdominal tenderness.  Musculoskeletal:        General: No swelling or tenderness.     Cervical back: Neck supple. No tenderness.  Lymphadenopathy:     Cervical: No cervical adenopathy.  Skin:    Findings: No erythema or rash.  Neurological:     Mental Status: She is alert.  Psychiatric:        Mood and Affect: Mood normal.        Behavior: Behavior normal.     Outpatient Encounter Medications as of 08/11/2021  Medication Sig   azelastine (ASTELIN) 0.1 % nasal spray Place 1 spray into both nostrils 2 (two) times daily. Use in each nostril as directed   atorvastatin (LIPITOR) 20 MG tablet Take 1 tablet (20 mg total) by mouth daily.   Ibuprofen 200 MG CAPS Take by mouth.   [DISCONTINUED] ipratropium (ATROVENT) 0.03 % nasal spray Place 1 spray into both nostrils every 12 (twelve) hours.   No facility-administered encounter medications on file as of 08/11/2021.     Lab Results  Component Value Date   WBC 8.1 12/08/2020   HGB 13.4 12/08/2020   HCT 39.6 12/08/2020   PLT 392.0 12/08/2020   GLUCOSE 88 08/11/2021   CHOL 175 08/11/2021   TRIG 285.0 (H) 08/11/2021   HDL 67.00 08/11/2021   LDLDIRECT 80.0 08/11/2021   LDLCALC 70 08/07/2020   ALT 19 08/11/2021   AST 20 08/11/2021   NA 140 08/11/2021   K 4.3 08/11/2021   CL 103 08/11/2021   CREATININE 0.84 08/11/2021   BUN 20 08/11/2021   CO2 29 08/11/2021   TSH 2.57 08/11/2021   MICROALBUR 0.7 01/24/2017    MM 3D SCREEN BREAST BILATERAL  Result Date: 10/29/2020 CLINICAL DATA:  Screening. EXAM: DIGITAL SCREENING BILATERAL MAMMOGRAM WITH TOMOSYNTHESIS AND CAD TECHNIQUE: Bilateral screening digital craniocaudal and mediolateral  oblique mammograms were obtained. Bilateral screening digital breast tomosynthesis was performed. The images were evaluated with computer-aided detection. COMPARISON:  Previous exam(s). ACR Breast Density Category b: There are scattered areas of fibroglandular density. FINDINGS: There are no findings suspicious for malignancy. The images  were evaluated with computer-aided detection. IMPRESSION: No mammographic evidence of malignancy. A result letter of this screening mammogram will be mailed directly to the patient. RECOMMENDATION: Screening mammogram in one year. (Code:[id number]-B-01Y) BI-RADS CATEGORY  1: Negative. Electronically Signed   By: Margarette Canada M.D.   On: 10/29/2020 15:36       Assessment & Plan:   Problem List Items Addressed This Visit     Hypercholesterolemia - Primary    Continue lipitor.  Low cholesterol diet and exercise.  Follow lipid panel and liver function tests.        Relevant Orders   Basic metabolic panel (Completed)   Lipid panel (Completed)   Hepatic function panel (Completed)   TSH (Completed)   Nasal congestion    Persistent congestion/dripping/drainage.  Trial of astelin nasal spray.  Follow.  Call with update.       Stress    Doing better.  Husband had his surgery.  Follow.       Thrombocytosis ([facility])    Recent platelet count wnl.  Follow.       Tobacco abuse    Discussed the need to quit smoking.  Have also discussed screening CT chest.  She declines.  Will notify me if changes her mind.          Einar Pheasant, MD

## 2021-08-14 ENCOUNTER — Ambulatory Visit: Payer: PPO | Admitting: Internal Medicine

## 2021-08-16 ENCOUNTER — Encounter: Payer: Self-pay | Admitting: Internal Medicine

## 2021-08-16 NOTE — Assessment & Plan Note (Signed)
Recent platelet count wnl.  Follow.

## 2021-08-16 NOTE — Assessment & Plan Note (Signed)
Discussed the need to quit smoking.  Have also discussed screening CT chest.  She declines.  Will notify me if changes her mind.

## 2021-08-16 NOTE — Assessment & Plan Note (Signed)
Persistent congestion/dripping/drainage.  Trial of astelin nasal spray.  Follow.  Call with update.

## 2021-08-16 NOTE — Assessment & Plan Note (Signed)
Doing better.  Husband had his surgery.  Follow.

## 2021-08-16 NOTE — Assessment & Plan Note (Signed)
Continue lipitor.  Low cholesterol diet and exercise.  Follow lipid panel and liver function tests.   

## 2021-10-12 ENCOUNTER — Ambulatory Visit (INDEPENDENT_AMBULATORY_CARE_PROVIDER_SITE_OTHER): Payer: PPO

## 2021-10-12 VITALS — Ht 63.0 in | Wt 147.0 lb

## 2021-10-12 DIAGNOSIS — Z780 Asymptomatic menopausal state: Secondary | ICD-10-CM | POA: Diagnosis not present

## 2021-10-12 DIAGNOSIS — Z1231 Encounter for screening mammogram for malignant neoplasm of breast: Secondary | ICD-10-CM | POA: Diagnosis not present

## 2021-10-12 DIAGNOSIS — Z0000 Encounter for general adult medical examination without abnormal findings: Secondary | ICD-10-CM | POA: Diagnosis not present

## 2021-10-12 NOTE — Patient Instructions (Addendum)
?  Ms. Monica Morales , ?Thank you for taking time to come for your Medicare Wellness Visit. I appreciate your ongoing commitment to your health goals. Please review the following plan we discussed and let me know if I can assist you in the future.  ? ?These are the goals we discussed: ? Goals   ? ?  ? Patient Stated  ?   Follow up with Primary Care Provider (pt-stated)   ?   As needed. ?  ? ?  ?  ?This is a list of the screening recommended for you and due dates:  ?Health Maintenance  ?Topic Date Due  ? Flu Shot  10/16/2021*  ? Zoster (Shingles) Vaccine (1 of 2) 11/09/2021*  ? Pneumonia Vaccine (1 - PCV) 08/11/2022*  ? Tetanus Vaccine  10/13/2022*  ? DEXA scan (bone density measurement)  Completed  ? Hepatitis C Screening: USPSTF Recommendation to screen - Ages 25-79 yo.  Completed  ? HPV Vaccine  Aged Out  ? Urine Protein Check  Discontinued  ? Colon Cancer Screening  Discontinued  ? COVID-19 Vaccine  Discontinued  ?*Topic was postponed. The date shown is not the original due date.  ?  ?

## 2021-10-12 NOTE — Progress Notes (Signed)
Subjective:   Monica Morales is a 76 y.o. female who presents for Medicare Annual (Subsequent) preventive examination.  Review of Systems    No ROS.  Medicare Wellness Virtual Visit.  Visual/audio telehealth visit, UTA vital signs.   See social history for additional risk factors.   Cardiac Risk Factors include: advanced age (>34men, >32 women)     Objective:    Today's Vitals   10/12/21 0905  Weight: 147 lb (66.7 kg)  Height: 5\' 3"  (1.6 m)   Body mass index is 26.04 kg/m.     10/12/2021    9:10 AM 12/27/2019    9:45 AM 12/26/2018    9:53 AM 05/22/2018    1:22 PM 11/23/2017    1:27 PM 11/07/2017   10:46 AM 08/30/2016   11:18 AM  Advanced Directives  Does Patient Have a Medical Advance Directive? Yes Yes Yes Yes Yes Yes Yes  Type of Estate agent of [place];Living will Healthcare Power of [place];Living will Living will Healthcare Power of [place];Living will Healthcare Power of [place];Living will Living will;Healthcare Power of [employer] Power of [place];Living will  Does patient want to make changes to medical advance directive? No - Patient declined No - Patient declined No - Patient declined    No - Patient declined  Copy of Healthcare Power of Attorney in Chart? No - copy requested No - copy requested    No - copy requested No - copy requested   Current Medications (verified) Outpatient Encounter Medications as of 10/12/2021  Medication Sig   atorvastatin (LIPITOR) 20 MG tablet Take 1 tablet (20 mg total) by mouth daily.   azelastine (ASTELIN) 0.1 % nasal spray Place 1 spray into both nostrils 2 (two) times daily. Use in each nostril as directed   Ibuprofen 200 MG CAPS Take by mouth.   No facility-administered encounter medications on file as of 10/12/2021.   Allergies (verified) No known allergies and No known drug allergy   History: Past Medical History:  Diagnosis Date   Chronic bronchitis (HCC)    Hypercholesterolemia     Migraines    Past Surgical History:  Procedure Laterality Date   ABDOMINAL HYSTERECTOMY  1979   secondary to fibroids, incidental appendectomy   BREAST EXCISIONAL BIOPSY Right 1979   benign   TONSILLECTOMY  1960   Family History  Problem Relation Age of Onset   Heart disease Father        myocardial infarction - died age 71   Heart disease Mother    Diabetes Mother    Diabetes Maternal Grandmother    Breast cancer Neg Hx    Colon cancer Neg Hx    Social History   Socioeconomic History   Marital status: Married    Spouse name: Not on file   Number of children: 1   Years of education: Not on file   Highest education level: Not on file  Occupational History   Not on file  Tobacco Use   Smoking status: Every Day    Packs/day: 1.00    Years: 50.00    Pack years: 50.00    Types: Cigarettes   Smokeless tobacco: Never   Tobacco comments:    is going to try electronic cigarettes  Vaping Use   Vaping Use: Never used  Substance and Sexual Activity   Alcohol use: No    Alcohol/week: 0.0 standard drinks   Drug use: No   Sexual activity: Not Currently  Other Topics Concern  Not on file  Social History Narrative   Not on file   Social Determinants of Health   Financial Resource Strain: Low Risk    Difficulty of Paying Living Expenses: Not hard at all  Food Insecurity: No Food Insecurity   Worried About Programme researcher, broadcasting/film/video in the Last Year: Never true   Barista in the Last Year: Never true  Transportation Needs: No Transportation Needs   Lack of Transportation (Medical): No   Lack of Transportation (Non-Medical): No  Physical Activity: Not on file  Stress: No Stress Concern Present   Feeling of Stress : Only a little  Social Connections: Press photographer of Communication with Friends and Family: More than three times a week   Frequency of Social Gatherings with Friends and Family: More than three times a week   Attends Religious Services:  More than 4 times per year   Active Member of [employer] or Organizations: Yes   Attends Banker Meetings: 1 to 4 times per year   Marital Status: Married    Tobacco Counseling Ready to quit: Not Answered Counseling given: Not Answered Tobacco comments: is going to try electronic cigarettes   Clinical Intake:  Pre-visit preparation completed: Yes        Diabetes: No  How often do you need to have someone help you when you read instructions, pamphlets, or other written materials from your doctor or pharmacy?: 1 - Never   Interpreter Needed?: No    Activities of Daily Living    10/12/2021    9:11 AM  In your present state of health, do you have any difficulty performing the following activities:  Hearing? 0  Vision? 0  Difficulty concentrating or making decisions? 0  Walking or climbing stairs? 0  Dressing or bathing? 0  Doing errands, shopping? 0  Preparing Food and eating ? N  Using the Toilet? N  In the past six months, have you accidently leaked urine? N  Do you have problems with loss of bowel control? N  Managing your Medications? N  Managing your Finances? N  Housekeeping or managing your Housekeeping? N    Patient Care Team: Dale Osborn, MD as PCP - General (Internal Medicine)  Indicate any recent Medical Services you may have received from other than [facility] providers in the past year (date may be approximate).     Assessment:   This is a routine wellness examination for Monica Morales.  Virtual Visit via Telephone Note  I connected with  Monica Morales on 10/12/21 at  9:00 AM EDT by telephone and verified that I am speaking with the correct person using two identifiers.  Persons participating in the virtual visit: patient/Nurse Health Advisor   I discussed the limitations of performing an evaluation and management service by telehealth. The patient expressed understanding and agreed to proceed.  We continued and completed visit with audio  only.  Some vital signs may be absent or patient reported.   Hearing/Vision screen Hearing Screening - Comments:: Patient is able to hear conversational tones without difficulty. No issues reported.  Vision Screening - Comments:: Wears reader lenses   Dietary issues and exercise activities discussed: Current Exercise Habits: Home exercise routine, Type of exercise: walking, Intensity: Mild Regular diet Good water intake   Goals Addressed               This Visit's Progress     Patient Stated     Follow up with  Primary Care Provider (pt-stated)        As needed.       Depression Screen    10/12/2021    9:08 AM 04/14/2021   10:06 AM 12/08/2020   10:32 AM 12/27/2019    9:46 AM 12/26/2018    9:54 AM 08/30/2016   11:17 AM 05/10/2016   11:58 AM  PHQ 2/9 Scores  PHQ - 2 Score 0 0 0 0 0 0 0    Fall Risk    10/12/2021    9:11 AM 04/14/2021   10:06 AM 12/08/2020   10:32 AM 12/27/2019    9:46 AM 05/29/2019    9:02 AM  Fall Risk   Falls in the past year? 0 0 0 0 0  Number falls in past yr: 0   0   Follow up Falls evaluation completed Falls evaluation completed Falls evaluation completed Falls evaluation completed Falls evaluation completed    FALL RISK PREVENTION PERTAINING TO THE HOME: Home free of loose throw rugs in walkways, pet beds, electrical cords, etc? Yes  Adequate lighting in your home to reduce risk of falls? Yes   ASSISTIVE DEVICES UTILIZED TO PREVENT FALLS: Life alert? No  Use of a cane, walker or w/c? No   TIMED UP AND GO: Was the test performed? No .   Cognitive Function: Patient is alert and oriented x3.  Normal cognitive status assessed by direct observation/communication. No abnormalities found.      08/27/2015   10:00 AM  MMSE - Mini Mental State Exam  Orientation to time 5  Orientation to Place 5  Registration 3  Attention/ Calculation 5  Recall 3  Language- name 2 objects 2  Language- repeat 1  Language- follow 3 step command 3   Language- read & follow direction 1  Write a sentence 1  Copy design 1  Total score 30        12/27/2019   10:04 AM 12/26/2018    9:58 AM 08/30/2016   11:22 AM  6CIT Screen  What Year? 0 points 0 points 0 points  What month? 0 points 0 points 0 points  What time?  0 points 0 points  Count back from 20  0 points 0 points  Months in reverse 0 points 0 points 0 points  Repeat phrase  0 points 0 points  Total Score  0 points 0 points    Immunizations  There is no immunization history on file for this patient.  TDAP status: Due, Education has been provided regarding the importance of this vaccine. Advised may receive this vaccine at local pharmacy or Health Dept. Aware to provide a copy of the vaccination record if obtained from local pharmacy or Health Dept. Verbalized acceptance and understanding. Deferred.   Screening Tests Health Maintenance  Topic Date Due   INFLUENZA VACCINE  10/16/2021 (Originally 02/16/2021)   Zoster Vaccines- Shingrix (1 of 2) 11/09/2021 (Originally 02/12/1965)   Pneumonia Vaccine 64+ Years old (1 - PCV) 08/11/2022 (Originally 02/11/1952)   TETANUS/TDAP  10/13/2022 (Originally [birth year]12/1965)   DEXA SCAN  Completed   Hepatitis C Screening  Completed   HPV VACCINES  Aged Out   URINE MICROALBUMIN  Discontinued   COLONOSCOPY (Pts 45-28yrs Insurance coverage will need to be confirmed)  Discontinued   COVID-19 Vaccine  Discontinued   Health Maintenance There are no preventive care reminders to display for this patient.  Mammogram- plans to schedule. Ordered.   Bone density- plans to schedule with Mammogram. Ordered.  Lung Cancer Screening: deferred per patient.   Hepatitis C Screening:  Completed.  Vision Screening: Recommended annual ophthalmology exams for early detection of glaucoma and other disorders of the eye.  Dental Screening: Recommended annual dental exams for proper oral hygiene  Community Resource Referral / Chronic Care Management: CRR  required this visit?  No   CCM required this visit?  No      Plan:  I have personally reviewed and noted the following in the patient's chart:   Medical and social history Use of alcohol, tobacco or illicit drugs  Current medications and supplements including opioid prescriptions.  Functional ability and status Nutritional status Physical activity Advanced directives List of other physicians Hospitalizations, surgeries, and ER visits in previous 12 months Vitals Screenings to include cognitive, depression, and falls Referrals and appointments  In addition, I have reviewed and discussed with patient certain preventive protocols, quality metrics, and best practice recommendations. A written personalized care plan for preventive services as well as general preventive health recommendations were provided to patient.     Ashok Pall, LPN   1/61/0960

## 2021-11-10 ENCOUNTER — Ambulatory Visit (INDEPENDENT_AMBULATORY_CARE_PROVIDER_SITE_OTHER): Payer: PPO | Admitting: Internal Medicine

## 2021-11-10 ENCOUNTER — Encounter: Payer: Self-pay | Admitting: Internal Medicine

## 2021-11-10 DIAGNOSIS — F439 Reaction to severe stress, unspecified: Secondary | ICD-10-CM

## 2021-11-10 DIAGNOSIS — E7800 Pure hypercholesterolemia, unspecified: Secondary | ICD-10-CM | POA: Diagnosis not present

## 2021-11-10 DIAGNOSIS — Z720 Tobacco use: Secondary | ICD-10-CM

## 2021-11-10 DIAGNOSIS — Z13820 Encounter for screening for osteoporosis: Secondary | ICD-10-CM | POA: Diagnosis not present

## 2021-11-10 NOTE — Progress Notes (Signed)
[name], female   DOB: Oct 16, 1945, 76 y.o.   MRN: [id number] ? ? ?Subjective:  ? ? [name], female    DOB: [birth year], 76 y.o.   MRN: [id number] ? ?This visit occurred during the SARS-CoV-2 public health emergency.  Safety protocols were in place, including screening questions prior to the visit, additional usage of staff PPE, and extensive cleaning of exam room while observing appropriate contact time as indicated for disinfecting solutions.  ? ?Patient here for a scheduled follow up  ? ?Chief Complaint  ?Patient presents with  ? Follow-up  ?  3 mo f/u for stress management  ? .  ? ?HPI ?Overall she feels she is doing relatively well.  Increased stress with her husband's health issues.  Discussed.  Does not feel needs any further intervention.  Has good support.  No chest pain.  Breathing stable.  Continues to smoke. Discussed the need to quit.  No increased cough or congestion.  No acid reflux. No abdominal pain.  Bowels moving.  ? ? ?Past Medical History:  ?Diagnosis Date  ? Chronic bronchitis ([place])   ? Hypercholesterolemia   ? Migraines   ? ?Past Surgical History:  ?Procedure Laterality Date  ? ABDOMINAL HYSTERECTOMY  1979  ? secondary to fibroids, incidental appendectomy  ? BREAST EXCISIONAL BIOPSY Right 1979  ? benign  ? TONSILLECTOMY  1960  ? ?Family History  ?Problem Relation Age of Onset  ? Heart disease Father   ?     myocardial infarction - died age 19  ? Heart disease Mother   ? Diabetes Mother   ? Diabetes Maternal Grandmother   ? Breast cancer Neg Hx   ? Colon cancer Neg Hx   ? ?Social History  ? ?Socioeconomic History  ? Marital status: Married  ?  Spouse name: Not on file  ? Number of children: 1  ? Years of education: Not on file  ? Highest education level: Not on file  ?Occupational History  ? Not on file  ?Tobacco Use  ? Smoking status: Every Day  ?  Packs/day: 1.00  ?  Years: 50.00  ?  Pack years: 50.00  ?  Types: Cigarettes  ? Smokeless tobacco: Never  ?  Tobacco comments:  ?  is going to try electronic cigarettes  ?Vaping Use  ? Vaping Use: Never used  ?Substance and Sexual Activity  ? Alcohol use: No  ?  Alcohol/week: 0.0 standard drinks  ? Drug use: No  ? Sexual activity: Not Currently  ?Other Topics Concern  ? Not on file  ?Social History Narrative  ? Not on file  ? ?Social Determinants of Health  ? ?Financial Resource Strain: Low Risk   ? Difficulty of Paying Living Expenses: Not hard at all  ?Food Insecurity: No Food Insecurity  ? Worried About Charity fundraiser in the Last Year: Never true  ? Ran Out of Food in the Last Year: Never true  ?Transportation Needs: No Transportation Needs  ? Lack of Transportation (Medical): No  ? Lack of Transportation (Non-Medical): No  ?Physical Activity: Not on file  ?Stress: No Stress Concern Present  ? Feeling of Stress : Only a little  ?Social Connections: Socially Integrated  ? Frequency of Communication with Friends and Family: More than three times a week  ? Frequency of Social Gatherings with Friends and Family: More than three times a week  ? Attends Religious Services: More than 4 times per year  ?  Active Member of Clubs or Organizations: Yes  ? Attends Archivist Meetings: 1 to 4 times per year  ? Marital Status: Married  ? ? ? ?Review of Systems  ?Constitutional:  Negative for appetite change and unexpected weight change.  ?HENT:  Negative for congestion and sinus pressure.   ?Respiratory:  Negative for cough, chest tightness and shortness of breath.   ?Cardiovascular:  Negative for chest pain, palpitations and leg swelling.  ?Gastrointestinal:  Negative for abdominal pain, diarrhea, nausea and vomiting.  ?Genitourinary:  Negative for difficulty urinating and dysuria.  ?Musculoskeletal:  Negative for joint swelling and myalgias.  ?Skin:  Negative for color change and rash.  ?Neurological:  Negative for dizziness, light-headedness and headaches.  ?Psychiatric/Behavioral:  Negative for agitation and  dysphoric mood.   ?     Increased stress as outlined.   ? ?   ?Objective:  ?  ? ?BP 124/70 (BP Location: Left Arm, Patient Position: Sitting, Cuff Size: Small)   Pulse 64   Temp 97.6 ?F (36.4 ?C) (Temporal)   Resp 14   Ht '5\' 3"'$  (1.6 m)   Wt 147 lb (66.7 kg)   SpO2 96%   BMI 26.04 kg/m?  ?Wt Readings from Last 3 Encounters:  ?11/10/21 147 lb (66.7 kg)  ?10/12/21 147 lb (66.7 kg)  ?08/11/21 147 lb 12.8 oz (67 kg)  ? ? ?Physical Exam ?Vitals reviewed.  ?Constitutional:   ?   General: She is not in acute distress. ?   Appearance: Normal appearance.  ?HENT:  ?   Head: Normocephalic and atraumatic.  ?   Right Ear: External ear normal.  ?   Left Ear: External ear normal.  ?Eyes:  ?   General: No scleral icterus.    ?   Right eye: No discharge.     ?   Left eye: No discharge.  ?   Conjunctiva/sclera: Conjunctivae normal.  ?Neck:  ?   Thyroid: No thyromegaly.  ?Cardiovascular:  ?   Rate and Rhythm: Normal rate and regular rhythm.  ?Pulmonary:  ?   Effort: No respiratory distress.  ?   Breath sounds: Normal breath sounds. No wheezing.  ?Abdominal:  ?   General: Bowel sounds are normal.  ?   Palpations: Abdomen is soft.  ?   Tenderness: There is no abdominal tenderness.  ?Musculoskeletal:     ?   General: No swelling or tenderness.  ?   Cervical back: Neck supple. No tenderness.  ?Lymphadenopathy:  ?   Cervical: No cervical adenopathy.  ?Skin: ?   Findings: No erythema or rash.  ?Neurological:  ?   Mental Status: She is alert.  ?Psychiatric:     ?   Mood and Affect: Mood normal.     ?   Behavior: Behavior normal.  ? ? ? ?Outpatient Encounter Medications as of 11/10/2021  ?Medication Sig  ? atorvastatin (LIPITOR) 20 MG tablet Take 1 tablet (20 mg total) by mouth daily.  ? azelastine (ASTELIN) 0.1 % nasal spray Place 1 spray into both nostrils 2 (two) times daily. Use in each nostril as directed  ? Ibuprofen 200 MG CAPS Take by mouth.  ? ?No facility-administered encounter medications on file as of 11/10/2021.  ?  ? ?Lab  Results  ?Component Value Date  ? WBC 8.1 12/08/2020  ? HGB 13.4 12/08/2020  ? HCT 39.6 12/08/2020  ? PLT 392.0 12/08/2020  ? GLUCOSE 88 08/11/2021  ? CHOL 175 08/11/2021  ? TRIG 285.0 (H) 08/11/2021  ?  HDL 67.00 08/11/2021  ? LDLDIRECT 80.0 08/11/2021  ? [place] 70 08/07/2020  ? ALT 19 08/11/2021  ? AST 20 08/11/2021  ? NA 140 08/11/2021  ? K 4.3 08/11/2021  ? CL 103 08/11/2021  ? CREATININE 0.84 08/11/2021  ? BUN 20 08/11/2021  ? CO2 29 08/11/2021  ? TSH 2.57 08/11/2021  ? MICROALBUR 0.7 01/24/2017  ? ? ?MM 3D SCREEN BREAST BILATERAL ? ?Result Date: 10/29/2020 ?CLINICAL DATA:  Screening. EXAM: DIGITAL SCREENING BILATERAL MAMMOGRAM WITH TOMOSYNTHESIS AND CAD TECHNIQUE: Bilateral screening digital craniocaudal and mediolateral oblique mammograms were obtained. Bilateral screening digital breast tomosynthesis was performed. The images were evaluated with computer-aided detection. COMPARISON:  Previous exam(s). ACR Breast Density Category b: There are scattered areas of fibroglandular density. FINDINGS: There are no findings suspicious for malignancy. The images were evaluated with computer-aided detection. IMPRESSION: No mammographic evidence of malignancy. A result letter of this screening mammogram will be mailed directly to the patient. RECOMMENDATION: Screening mammogram in one year. (Code:SM-B-01Y) BI-RADS CATEGORY  1: Negative. Electronically Signed   By: Margarette Canada M.D.   On: 10/29/2020 15:36  ? ? ?   ?Assessment & Plan:  ? ?Problem List Items Addressed This Visit   ? ? Hypercholesterolemia  ?  Continue lipitor.  Low cholesterol diet and exercise.  Follow lipid panel and liver function tests.   ? ?  ?  ? Relevant Orders  ? CBC with Differential/Platelet  ? Basic metabolic panel  ? Hepatic function panel  ? Lipid panel  ? Osteoporosis screening  ?  Schedule bone density.  ? ?  ?  ? Stress  ?  Increased stress as outlined.  Discussed.  Does not feel needs any further intervention.  Has good support.  Follow.   ? ?  ?  ? Tobacco abuse  ?  Discussed the need to quit smoking.  Have also discussed screening CT chest.  Will notify me if changes her mind.   ? ?  ?  ? ? ? ?Einar Pheasant, MD  ?

## 2021-11-21 ENCOUNTER — Encounter: Payer: Self-pay | Admitting: Internal Medicine

## 2021-11-21 NOTE — Assessment & Plan Note (Signed)
Continue lipitor.  Low cholesterol diet and exercise.  Follow lipid panel and liver function tests.   

## 2021-11-21 NOTE — Assessment & Plan Note (Signed)
Schedule bone density.    

## 2021-11-21 NOTE — Assessment & Plan Note (Addendum)
Discussed the need to quit smoking.  Have also discussed screening CT chest.  Will notify me if changes her mind.   ?

## 2021-11-21 NOTE — Assessment & Plan Note (Signed)
Increased stress as outlined.  Discussed.  Does not feel needs any further intervention.  Has good support.  Follow.  ?

## 2021-12-02 ENCOUNTER — Other Ambulatory Visit (INDEPENDENT_AMBULATORY_CARE_PROVIDER_SITE_OTHER): Payer: PPO

## 2021-12-02 DIAGNOSIS — E7800 Pure hypercholesterolemia, unspecified: Secondary | ICD-10-CM | POA: Diagnosis not present

## 2021-12-02 LAB — HEPATIC FUNCTION PANEL
ALT: 15 U/L (ref 0–35)
AST: 16 U/L (ref 0–37)
Albumin: 4.4 g/dL (ref 3.5–5.2)
Alkaline Phosphatase: 80 U/L (ref 39–117)
Bilirubin, Direct: 0.1 mg/dL (ref 0.0–0.3)
Total Bilirubin: 0.6 mg/dL (ref 0.2–1.2)
Total Protein: 6.9 g/dL (ref 6.0–8.3)

## 2021-12-02 LAB — CBC WITH DIFFERENTIAL/PLATELET
Basophils Absolute: 0 10*3/uL (ref 0.0–0.1)
Basophils Relative: 0.5 % (ref 0.0–3.0)
Eosinophils Absolute: 0.3 10*3/uL (ref 0.0–0.7)
Eosinophils Relative: 4.7 % (ref 0.0–5.0)
HCT: 41.2 % (ref 36.0–46.0)
Hemoglobin: 13.7 g/dL (ref 12.0–15.0)
Lymphocytes Relative: 36 % (ref 12.0–46.0)
Lymphs Abs: 2.5 10*3/uL (ref 0.7–4.0)
MCHC: 33.2 g/dL (ref 30.0–36.0)
MCV: 96 fl (ref 78.0–100.0)
Monocytes Absolute: 0.6 10*3/uL (ref 0.1–1.0)
Monocytes Relative: 9.1 % (ref 3.0–12.0)
Neutro Abs: 3.5 10*3/uL (ref 1.4–7.7)
Neutrophils Relative %: 49.7 % (ref 43.0–77.0)
Platelets: 405 10*3/uL — ABNORMAL HIGH (ref 150.0–400.0)
RBC: 4.29 Mil/uL (ref 3.87–5.11)
RDW: 13.8 % (ref 11.5–15.5)
WBC: 7 10*3/uL (ref 4.0–10.5)

## 2021-12-02 LAB — BASIC METABOLIC PANEL
BUN: 19 mg/dL (ref 6–23)
CO2: 29 mEq/L (ref 19–32)
Calcium: 9.3 mg/dL (ref 8.4–10.5)
Chloride: 105 mEq/L (ref 96–112)
Creatinine, Ser: 0.7 mg/dL (ref 0.40–1.20)
GFR: 83.2 mL/min (ref >60.00)
Glucose, Bld: 90 mg/dL (ref 70–99)
Potassium: 4.1 mEq/L (ref 3.5–5.1)
Sodium: 140 mEq/L (ref 135–145)

## 2021-12-02 LAB — LIPID PANEL
Cholesterol: 177 mg/dL (ref 0–200)
HDL: 70.2 mg/dL (ref >39.00)
LDL Cholesterol: 80 mg/dL (ref 0–99)
NonHDL: 107.15
Total CHOL/HDL Ratio: 3
Triglycerides: 136 mg/dL (ref 0.0–149.0)
VLDL: 27.2 mg/dL (ref 0.0–40.0)

## 2022-01-11 ENCOUNTER — Ambulatory Visit
Admission: RE | Admit: 2022-01-11 | Discharge: 2022-01-11 | Disposition: A | Discharge status: Home / Self Care | Payer: PPO | Source: Ambulatory Visit | Attending: Internal Medicine | Admitting: Internal Medicine

## 2022-01-11 DIAGNOSIS — Z1231 Encounter for screening mammogram for malignant neoplasm of breast: Secondary | ICD-10-CM | POA: Insufficient documentation

## 2022-01-11 DIAGNOSIS — M85852 Other specified disorders of bone density and structure, left thigh: Secondary | ICD-10-CM | POA: Diagnosis not present

## 2022-01-11 DIAGNOSIS — Z780 Asymptomatic menopausal state: Secondary | ICD-10-CM | POA: Insufficient documentation

## 2022-01-21 ENCOUNTER — Encounter: Payer: Self-pay | Admitting: Internal Medicine

## 2022-01-21 ENCOUNTER — Ambulatory Visit (INDEPENDENT_AMBULATORY_CARE_PROVIDER_SITE_OTHER): Payer: PPO | Admitting: Internal Medicine

## 2022-01-21 VITALS — BP 134/82 | HR 85 | Temp 98.4°F | Resp 16 | Ht 63.0 in | Wt 146.0 lb

## 2022-01-21 DIAGNOSIS — F439 Reaction to severe stress, unspecified: Secondary | ICD-10-CM

## 2022-01-21 DIAGNOSIS — K219 Gastro-esophageal reflux disease without esophagitis: Secondary | ICD-10-CM | POA: Diagnosis not present

## 2022-01-21 DIAGNOSIS — M8580 Other specified disorders of bone density and structure, unspecified site: Secondary | ICD-10-CM

## 2022-01-21 DIAGNOSIS — E7800 Pure hypercholesterolemia, unspecified: Secondary | ICD-10-CM

## 2022-01-21 DIAGNOSIS — R079 Chest pain, unspecified: Secondary | ICD-10-CM | POA: Diagnosis not present

## 2022-01-21 DIAGNOSIS — Z720 Tobacco use: Secondary | ICD-10-CM | POA: Diagnosis not present

## 2022-01-21 DIAGNOSIS — D75839 Thrombocytosis, unspecified: Secondary | ICD-10-CM | POA: Diagnosis not present

## 2022-01-21 DIAGNOSIS — R030 Elevated blood-pressure reading, without diagnosis of hypertension: Secondary | ICD-10-CM | POA: Diagnosis not present

## 2022-01-21 NOTE — Patient Instructions (Signed)
Pepcid (famotidine) '20mg'$  - take one tablet 30 minutes before breakfast

## 2022-01-21 NOTE — Progress Notes (Signed)
[name], female   DOB: 06-20-1946, 76 y.o.   MRN: [id number]   [name], female    DOB: [birth year], 76 y.o.   MRN: [id number]   Patient here for a scheduled follow up.   Chief Complaint  Patient presents with   Hyperlipidemia   .   HPI Reports has noticed some some increased gas - flatus.  Some occasional minimal nausea.  Does report increased acid.  Eating makes symptoms worse - increased gas.  Some pain - epigastric region.  No chest pain with increased activity or exertion.  Breathing stable.  No increased cough or congestion.  No abdominal pain.  Bowels moving. Discussed bone density and treatment options.  Continues to smoke.    Past Medical History:  Diagnosis Date   Chronic bronchitis ([place])    Hypercholesterolemia    Migraines    Past Surgical History:  Procedure Laterality Date   ABDOMINAL HYSTERECTOMY  1979   secondary to fibroids, incidental appendectomy   BREAST EXCISIONAL BIOPSY Right 1979   benign   TONSILLECTOMY  1960   Family History  Problem Relation Age of Onset   Heart disease Father        myocardial infarction - died age 66   Heart disease Mother    Diabetes Mother    Diabetes Maternal Grandmother    Breast cancer Neg Hx    Colon cancer Neg Hx    Social History   Socioeconomic History   Marital status: Married    Spouse name: Not on file   Number of children: 1   Years of education: Not on file   Highest education level: Not on file  Occupational History   Not on file  Tobacco Use   Smoking status: Every Day    Packs/day: 1.00    Years: 50.00    Total pack years: 50.00    Types: Cigarettes   Smokeless tobacco: Never   Tobacco comments:    is going to try electronic cigarettes  Vaping Use   Vaping Use: Never used  Substance and Sexual Activity   Alcohol use: No    Alcohol/week: 0.0 standard drinks of alcohol   Drug use: No   Sexual activity: Not Currently  Other Topics Concern    Not on file  Social History Narrative   Not on file   Social Determinants of Health   Financial Resource Strain: Low Risk  (10/12/2021)   Overall Financial Resource Strain (CARDIA)    Difficulty of Paying Living Expenses: Not hard at all  Food Insecurity: No Food Insecurity (10/12/2021)   Hunger Vital Sign    Worried About Running Out of Food in the Last Year: Never true    Ran Out of Food in the Last Year: Never true  Transportation Needs: No Transportation Needs (10/12/2021)   PRAPARE - Hydrologist (Medical): No    Lack of Transportation (Non-Medical): No  Physical Activity: Not on file  Stress: No Stress Concern Present (10/12/2021)   [place]    Feeling of Stress : Only a little  Social Connections: Socially Integrated (10/12/2021)   Social Connection and Isolation Panel [NHANES]    Frequency of Communication with Friends and Family: More than three times a week    Frequency of Social Gatherings with Friends and Family: More than three times a week    Attends Religious Services: More  than 4 times per year    Active Member of Clubs or Organizations: Yes    Attends Archivist Meetings: 1 to 4 times per year    Marital Status: Married     Review of Systems  Constitutional:  Negative for appetite change and unexpected weight change.  HENT:  Negative for congestion.   Respiratory:  Negative for cough, chest tightness and shortness of breath.   Cardiovascular:  Negative for chest pain, palpitations and leg swelling.  Gastrointestinal:  Negative for vomiting.       Minimal nausea.  Acid reflux and increased gas as outlined.   Genitourinary:  Negative for difficulty urinating and dysuria.  Musculoskeletal:  Negative for joint swelling and myalgias.  Skin:  Negative for color change and rash.  Neurological:  Negative for dizziness, light-headedness and headaches.   Psychiatric/Behavioral:  Negative for agitation and dysphoric mood.        Objective:     BP 134/82 (BP Location: Left Arm, Patient Position: Sitting, Cuff Size: Small)   Pulse 85   Temp 98.4 F (36.9 C) (Temporal)   Resp 16   Ht '5\' 3"'$  (1.6 m)   Wt 146 lb (66.2 kg)   SpO2 99%   BMI 25.86 kg/m  Wt Readings from Last 3 Encounters:  01/21/22 146 lb (66.2 kg)  11/10/21 147 lb (66.7 kg)  10/12/21 147 lb (66.7 kg)    Physical Exam Vitals reviewed.  Constitutional:      General: She is not in acute distress.    Appearance: Normal appearance.  HENT:     Head: Normocephalic and atraumatic.     Right Ear: External ear normal.     Left Ear: External ear normal.  Eyes:     General: No scleral icterus.       Right eye: No discharge.        Left eye: No discharge.     Conjunctiva/sclera: Conjunctivae normal.  Neck:     Thyroid: No thyromegaly.  Cardiovascular:     Rate and Rhythm: Normal rate and regular rhythm.  Pulmonary:     Effort: No respiratory distress.     Breath sounds: Normal breath sounds. No wheezing.  Abdominal:     General: Bowel sounds are normal.     Palpations: Abdomen is soft.     Tenderness: There is no abdominal tenderness.  Musculoskeletal:        General: No swelling or tenderness.     Cervical back: Neck supple. No tenderness.  Lymphadenopathy:     Cervical: No cervical adenopathy.  Skin:    Findings: No erythema or rash.  Neurological:     Mental Status: She is alert.  Psychiatric:        Mood and Affect: Mood normal.        Behavior: Behavior normal.      Outpatient Encounter Medications as of 01/21/2022  Medication Sig   atorvastatin (LIPITOR) 20 MG tablet Take 1 tablet (20 mg total) by mouth daily.   azelastine (ASTELIN) 0.1 % nasal spray Place 1 spray into both nostrils 2 (two) times daily. Use in each nostril as directed   Ibuprofen 200 MG CAPS Take by mouth.   No facility-administered encounter medications on file as of 01/21/2022.      Lab Results  Component Value Date   WBC 7.0 12/02/2021   HGB 13.7 12/02/2021   HCT 41.2 12/02/2021   PLT 405.0 (H) 12/02/2021   GLUCOSE 90 12/02/2021   CHOL  177 12/02/2021   TRIG 136.0 12/02/2021   HDL 70.20 12/02/2021   LDLDIRECT 80.0 08/11/2021   LDLCALC 80 12/02/2021   ALT 15 12/02/2021   AST 16 12/02/2021   NA 140 12/02/2021   K 4.1 12/02/2021   CL 105 12/02/2021   CREATININE 0.70 12/02/2021   BUN 19 12/02/2021   CO2 29 12/02/2021   TSH 2.57 08/11/2021   MICROALBUR 0.7 01/24/2017    Mammogram 3D SCREEN BREAST BILATERAL  Result Date: 01/11/2022 CLINICAL DATA:  Screening. EXAM: DIGITAL SCREENING BILATERAL MAMMOGRAM WITH TOMOSYNTHESIS AND CAD TECHNIQUE: Bilateral screening digital craniocaudal and mediolateral oblique mammograms were obtained. Bilateral screening digital breast tomosynthesis was performed. The images were evaluated with computer-aided detection. COMPARISON:  Previous exam(s). ACR Breast Density Category b: There are scattered areas of fibroglandular density. FINDINGS: There are no findings suspicious for malignancy. IMPRESSION: No mammographic evidence of malignancy. A result letter of this screening mammogram will be mailed directly to the patient. RECOMMENDATION: Screening mammogram in one year. (Code:SM-B-01Y) BI-RADS CATEGORY  1: Negative. Electronically Signed   By: Marin Olp M.D.   On: 01/11/2022 16:40   DEXAScan  Result Date: 01/11/2022 EXAM: DUAL X-RAY ABSORPTIOMETRY (DXA) FOR BONE MINERAL DENSITY IMPRESSION: Dear Dr. Nicki Reaper, Your patient Monica Morales completed a FRAX assessment on 01/11/2022 using the [place] (analysis version: 14.10) manufactured by [employer]. The following summarizes the results of our evaluation. PATIENT BIOGRAPHICAL: Name: Monica Morales, Monica Morales Patient ID: [id number] Birth Date: 05-18-1946 Height:    62.0 in. Gender:     [age]    [age]       Weight:    145.1 lbs. Ethnicity:  White                             Exam Date: 01/11/2022 FRAX* RESULTS:  (version: 3.5) 10-year Probability of Fracture1 Major Osteoporotic Fracture2 Hip Fracture 19.2% 9.0% Population: [place] (Caucasian) Risk Factors: Tobacco User (Current Smoker) Based on Femur (Left) Neck BMD 1 -The 10-year probability of fracture may be lower than reported if the patient has received treatment. 2 -Major Osteoporotic Fracture: Clinical Spine, Forearm, Hip or Shoulder *FRAX is a Materials engineer of the [employer] for Metabolic Bone Disease, a [place] (WHO) [facility]. ASSESSMENT: The probability of a major osteoporotic fracture is 19.2% within the next ten years. The probability of a hip fracture is 9.0% within the next ten years. . Your patient Monica Morales completed a BMD test on 01/11/2022 using the [employer] (software version: 14.10) manufactured by [employer]. The following summarizes the results of our evaluation. Technologist: [facility] PATIENT BIOGRAPHICAL: Name: Monica Morales, Monica Morales Patient ID: [id number] Birth Date: [birth year] Height: 62.0 in. Gender: Female Exam Date: 01/11/2022 Weight: 145.1 lbs. Indications: Advanced Age, Caucasian, Hysterectomy, Oophorectomy Bilateral, Postmenopausal, Tobacco User (Current Smoker) Fractures: Treatments: Vitamin D DENSITOMETRY RESULTS: Site         Region     Measured Date Measured Age WHO Classification Young Adult T-score BMD         %Change vs. Previous Significant Change (*) DualFemur Neck Left 01/11/2020 77.9 Osteopenia -2.4 0.709 g/cm2 -6.5% - DualFemur Neck Left 10/25/2017 73.7 Osteopenia -2.0 0.758 g/cm2 - - DualFemur Total Mean 01/11/2022 77.9 Osteopenia -1.1 0.870 g/cm2 -2.1% Yes DualFemur Total Mean 10/25/2017 73.7 Normal -0.9 0.889 g/cm2 - - Left Forearm Radius 33% 01/11/2022 77.9  Normal -0.7 0.812 g/cm2 1.4% - Left Forearm Radius 33% 10/25/2017 73.7 Normal -0.9 0.801 g/cm2 - - ASSESSMENT: The BMD measured at Femur Neck Left  is 0.709 g/cm2 with a T-score of -2.4. This patient is considered osteopenic according to [facility]) criteria. The scan quality is good. Lumbar spine was not utilized due to advanced degenerative changes. Compared with prior study, there has been significant decrease in the total hip. World Pharmacologist [facility]) criteria for post-menopausal, Caucasian Women: Normal:                   T-score at or above -1 SD Osteopenia/low bone mass: T-score between -1 and -2.5 SD Osteoporosis:             T-score at or below -2.5 SD RECOMMENDATIONS: 1. All patients should optimize calcium and vitamin D intake. 2. Consider FDA-approved medical therapies in postmenopausal women and men aged 64 years and older, based on the following: a. A hip or vertebral(clinical or morphometric) fracture b. T-score < -2.5 at the femoral neck or spine after appropriate evaluation to exclude secondary causes c. Low bone mass (T-score between -1.0 and -2.5 at the femoral neck or spine) and a 10-year probability of a hip fracture > 3% or a 10-year probability of a major osteoporosis-related fracture > 20% based on the [place]-adapted WHO algorithm 3. Clinician judgment and/or patient preferences may indicate treatment for people with 10-year fracture probabilities above or below these levels FOLLOW-UP: People with diagnosed cases of osteoporosis or at high risk for fracture should have regular bone mineral density tests. For patients eligible for Medicare, routine testing is allowed once every 2 years. The testing frequency can be increased to one year for patients who have rapidly progressing disease, those who are receiving or discontinuing medical therapy to restore bone mass, or have additional risk factors. I have reviewed this report, and agree with the above findings. [facility] Electronically Signed   By: Dorise Bullion III M.D.   On: 01/11/2022 08:58       Assessment & Plan:   Problem List Items  Addressed This Visit     Elevated blood pressure reading   Relevant Orders   Basic Metabolic Panel (BMET)   GERD (gastroesophageal reflux disease)    Increased gas and discomfort as outlined.  Some acid reflux.  Minimal nausea.  Discussed possible etiologies.  EKG performed - SR with no acute ischemic changes when compared to previous check.  No symptoms with increased activity or exertion.  Discussed possible gallstone, gerd, etc.  Discussed abdominal ultrasound.  Start pepcid '20mg'$  q day.  She desires to hold on any further testing/scanning at this time.  Follow.  Get her back in soon to reassess.  Any change or worsening problems, needs to be evaluated.       Hypercholesterolemia - Primary    Continue lipitor.  Low cholesterol diet and exercise.  Follow lipid panel and liver function tests.        Relevant Orders   Lipid Profile   Hepatic function panel   Osteopenia    Recent bone density reveals osteopenia - -2.4 femoral neck.  Discussed treatment options.  Avoid oral bisphosphonates given current GI issues.  Discussed IV reclast.  She declines at this time.  Will continue calcium, vitamin d and weight bearing exercise.  Follow.       Stress    Overall appears to be handling things relatively well.  Follow.  Thrombocytosis ([place])    Has been worked up by hematology.  Follow cbc.       Tobacco abuse    Have discussed the need to quit smoking.  Have also discussed screening CT chest.  Has declined. Will notify me if changes her mind.        Other Visit Diagnoses     Chest pain, unspecified type       Relevant Orders   EKG 12-Lead (Completed)        Einar Pheasant, MD

## 2022-01-28 ENCOUNTER — Encounter: Payer: Self-pay | Admitting: Internal Medicine

## 2022-01-28 DIAGNOSIS — M8580 Other specified disorders of bone density and structure, unspecified site: Secondary | ICD-10-CM | POA: Insufficient documentation

## 2022-01-28 NOTE — Assessment & Plan Note (Addendum)
Have discussed the need to quit smoking.  Have also discussed screening CT chest.  Has declined. Will notify me if changes her mind.

## 2022-01-28 NOTE — Assessment & Plan Note (Signed)
Continue lipitor.  Low cholesterol diet and exercise.  Follow lipid panel and liver function tests.   

## 2022-01-28 NOTE — Assessment & Plan Note (Signed)
Recent bone density reveals osteopenia - -2.4 femoral neck.  Discussed treatment options.  Avoid oral bisphosphonates given current GI issues.  Discussed IV reclast.  She declines at this time.  Will continue calcium, vitamin d and weight bearing exercise.  Follow.

## 2022-01-28 NOTE — Assessment & Plan Note (Signed)
Overall appears to be handling things relatively well.  Follow.   

## 2022-01-28 NOTE — Assessment & Plan Note (Signed)
Increased gas and discomfort as outlined.  Some acid reflux.  Minimal nausea.  Discussed possible etiologies.  EKG performed - SR with no acute ischemic changes when compared to previous check.  No symptoms with increased activity or exertion.  Discussed possible gallstone, gerd, etc.  Discussed abdominal ultrasound.  Start pepcid '20mg'$  q day.  She desires to hold on any further testing/scanning at this time.  Follow.  Get her back in soon to reassess.  Any change or worsening problems, needs to be evaluated.

## 2022-01-28 NOTE — Assessment & Plan Note (Signed)
Has been worked up by hematology.  Follow cbc.

## 2022-03-12 ENCOUNTER — Ambulatory Visit: Payer: PPO | Admitting: Internal Medicine

## 2022-04-05 ENCOUNTER — Ambulatory Visit (INDEPENDENT_AMBULATORY_CARE_PROVIDER_SITE_OTHER): Payer: PPO | Admitting: Internal Medicine

## 2022-04-05 ENCOUNTER — Encounter: Payer: Self-pay | Admitting: Internal Medicine

## 2022-04-05 VITALS — BP 160/102 | HR 76 | Temp 98.0°F | Ht 63.0 in | Wt 147.2 lb

## 2022-04-05 DIAGNOSIS — F439 Reaction to severe stress, unspecified: Secondary | ICD-10-CM | POA: Diagnosis not present

## 2022-04-05 DIAGNOSIS — E7800 Pure hypercholesterolemia, unspecified: Secondary | ICD-10-CM | POA: Diagnosis not present

## 2022-04-05 DIAGNOSIS — I10 Essential (primary) hypertension: Secondary | ICD-10-CM | POA: Diagnosis not present

## 2022-04-05 DIAGNOSIS — D75839 Thrombocytosis, unspecified: Secondary | ICD-10-CM

## 2022-04-05 DIAGNOSIS — K219 Gastro-esophageal reflux disease without esophagitis: Secondary | ICD-10-CM

## 2022-04-05 DIAGNOSIS — Z720 Tobacco use: Secondary | ICD-10-CM | POA: Diagnosis not present

## 2022-04-05 MED ORDER — AMLODIPINE BESYLATE 2.5 MG PO TABS: 2.5000 mg | ORAL_TABLET | Freq: Every day | ORAL | 2 refills | Status: DC

## 2022-04-05 NOTE — Progress Notes (Signed)
[name], female   DOB: [birth year], 76 y.o.   MRN: [id number]   [name], female    DOB: 1946/06/20, 76 y.o.   MRN: [id number]   Patient here for  Chief Complaint  Patient presents with   Follow-up    2 month follow up   .   HPI Here to follow up regarding increased acid reflux - reported last visit. Started on pepcid.  No significant acid reflux reported.  No chest pain.  Increased stress - especially with husband's medical issues.  He is at a f/u appt currently discussing f/u scan.  Feels this is why her blood pressure is elevated today.  Breathing stable.  Still smoking.  No desire to quit at this time.  No abdominal pain.  Bowels moving.  States blood pressures at home - [phone] systolic readings.     Past Medical History:  Diagnosis Date   Chronic bronchitis ([place])    Hypercholesterolemia    Migraines    Past Surgical History:  Procedure Laterality Date   ABDOMINAL HYSTERECTOMY  1979   secondary to fibroids, incidental appendectomy   BREAST EXCISIONAL BIOPSY Right 1979   benign   TONSILLECTOMY  1960   Family History  Problem Relation Age of Onset   Heart disease Father        myocardial infarction - died age 33   Heart disease Mother    Diabetes Mother    Diabetes Maternal Grandmother    Breast cancer Neg Hx    Colon cancer Neg Hx    Social History   Socioeconomic History   Marital status: Married    Spouse name: Not on file   Number of children: 1   Years of education: Not on file   Highest education level: Not on file  Occupational History   Not on file  Tobacco Use   Smoking status: Every Day    Packs/day: 1.00    Years: 50.00    Total pack years: 50.00    Types: Cigarettes   Smokeless tobacco: Never   Tobacco comments:    is going to try electronic cigarettes  Vaping Use   Vaping Use: Never used  Substance and Sexual Activity   Alcohol use: No    Alcohol/week: 0.0 standard drinks of alcohol    Drug use: No   Sexual activity: Not Currently  Other Topics Concern   Not on file  Social History Narrative   Not on file   Social Determinants of Health   Financial Resource Strain: Low Risk  (10/12/2021)   Overall Financial Resource Strain (CARDIA)    Difficulty of Paying Living Expenses: Not hard at all  Food Insecurity: No Food Insecurity (10/12/2021)   Hunger Vital Sign    Worried About Running Out of Food in the Last Year: Never true    Ran Out of Food in the Last Year: Never true  Transportation Needs: No Transportation Needs (10/12/2021)   PRAPARE - Hydrologist (Medical): No    Lack of Transportation (Non-Medical): No  Physical Activity: Not on file  Stress: No Stress Concern Present (10/12/2021)   [place]    Feeling of Stress : Only a little  Social Connections: Socially Integrated (10/12/2021)   Social Connection and Isolation Panel [NHANES]    Frequency of Communication with Friends and Family: More than three times a week  Frequency of Social Gatherings with Friends and Family: More than three times a week    Attends Religious Services: More than 4 times per year    Active Member of Clubs or Organizations: Yes    Attends Archivist Meetings: 1 to 4 times per year    Marital Status: Married     Review of Systems  Constitutional:  Negative for appetite change and unexpected weight change.  HENT:  Negative for congestion and sinus pressure.   Respiratory:  Negative for cough, chest tightness and shortness of breath.   Cardiovascular:  Negative for chest pain, palpitations and leg swelling.  Gastrointestinal:  Negative for abdominal pain, diarrhea, nausea and vomiting.  Genitourinary:  Negative for difficulty urinating and dysuria.  Musculoskeletal:  Negative for joint swelling and myalgias.  Skin:  Negative for color change and rash.  Neurological:  Negative  for dizziness, light-headedness and headaches.  Psychiatric/Behavioral:  Negative for agitation and dysphoric mood.        Increased stress as outlined.        Objective:     BP (!) 160/102 (BP Location: Left Arm, Patient Position: Sitting, Cuff Size: Normal)   Pulse 76   Temp 98 F (36.7 C) (Oral)   Ht '5\' 3"'$  (1.6 m)   Wt 147 lb 3.2 oz (66.8 kg)   SpO2 98%   BMI 26.08 kg/m  Wt Readings from Last 3 Encounters:  04/05/22 147 lb 3.2 oz (66.8 kg)  01/21/22 146 lb (66.2 kg)  11/10/21 147 lb (66.7 kg)    Physical Exam Vitals reviewed.  Constitutional:      General: She is not in acute distress.    Appearance: Normal appearance.  HENT:     Head: Normocephalic and atraumatic.     Right Ear: External ear normal.     Left Ear: External ear normal.  Eyes:     General: No scleral icterus.       Right eye: No discharge.        Left eye: No discharge.     Conjunctiva/sclera: Conjunctivae normal.  Neck:     Thyroid: No thyromegaly.  Cardiovascular:     Rate and Rhythm: Normal rate and regular rhythm.  Pulmonary:     Effort: No respiratory distress.     Breath sounds: Normal breath sounds. No wheezing.  Abdominal:     General: Bowel sounds are normal.     Palpations: Abdomen is soft.     Tenderness: There is no abdominal tenderness.  Musculoskeletal:        General: No swelling or tenderness.     Cervical back: Neck supple. No tenderness.  Lymphadenopathy:     Cervical: No cervical adenopathy.  Skin:    Findings: No erythema or rash.  Neurological:     Mental Status: She is alert.  Psychiatric:        Mood and Affect: Mood normal.        Behavior: Behavior normal.      Outpatient Encounter Medications as of 04/05/2022  Medication Sig   amLODipine (NORVASC) 2.5 MG tablet Take 1 tablet (2.5 mg total) by mouth daily.   atorvastatin (LIPITOR) 20 MG tablet Take 1 tablet (20 mg total) by mouth daily.   azelastine (ASTELIN) 0.1 % nasal spray Place 1 spray into both  nostrils 2 (two) times daily. Use in each nostril as directed   Ibuprofen 200 MG CAPS Take by mouth.   No facility-administered encounter medications on file as of  04/05/2022.     Lab Results  Component Value Date   WBC 7.0 12/02/2021   HGB 13.7 12/02/2021   HCT 41.2 12/02/2021   PLT 405.0 (H) 12/02/2021   GLUCOSE 90 12/02/2021   CHOL 177 12/02/2021   TRIG 136.0 12/02/2021   HDL 70.20 12/02/2021   LDLDIRECT 80.0 08/11/2021   LDLCALC 80 12/02/2021   ALT 15 12/02/2021   AST 16 12/02/2021   NA 140 12/02/2021   K 4.1 12/02/2021   CL 105 12/02/2021   CREATININE 0.70 12/02/2021   BUN 19 12/02/2021   CO2 29 12/02/2021   TSH 2.57 08/11/2021   MICROALBUR 0.7 01/24/2017    Mammogram 3D SCREEN BREAST BILATERAL  Result Date: 01/11/2022 CLINICAL DATA:  Screening. EXAM: DIGITAL SCREENING BILATERAL MAMMOGRAM WITH TOMOSYNTHESIS AND CAD TECHNIQUE: Bilateral screening digital craniocaudal and mediolateral oblique mammograms were obtained. Bilateral screening digital breast tomosynthesis was performed. The images were evaluated with computer-aided detection. COMPARISON:  Previous exam(s). ACR Breast Density Category b: There are scattered areas of fibroglandular density. FINDINGS: There are no findings suspicious for malignancy. IMPRESSION: No mammographic evidence of malignancy. A result letter of this screening mammogram will be mailed directly to the patient. RECOMMENDATION: Screening mammogram in one year. (Code:SM-B-01Y) BI-RADS CATEGORY  1: Negative. Electronically Signed   By: Marin Olp M.D.   On: 01/11/2022 16:40   DEXAScan  Result Date: 01/11/2022 EXAM: DUAL X-RAY ABSORPTIOMETRY (DXA) FOR BONE MINERAL DENSITY IMPRESSION: Dear Dr. Nicki Reaper, Your patient DALEY MOORADIAN completed a FRAX assessment on 01/11/2022 using the [place] (analysis version: 14.10) manufactured by [employer]. The following summarizes the results of our evaluation. PATIENT BIOGRAPHICAL: Name: Livia, Tarr Patient ID: [id number] Birth Date: Dec 06, 1945 Height:    62.0 in. Gender:     [age]    [age]       Weight:    145.1 lbs. Ethnicity:  White                            Exam Date: 01/11/2022 FRAX* RESULTS:  (version: 3.5) 10-year Probability of Fracture1 Major Osteoporotic Fracture2 Hip Fracture 19.2% 9.0% Population: [place] (Caucasian) Risk Factors: Tobacco User (Current Smoker) Based on Femur (Left) Neck BMD 1 -The 10-year probability of fracture may be lower than reported if the patient has received treatment. 2 -Major Osteoporotic Fracture: Clinical Spine, Forearm, Hip or Shoulder *FRAX is a Materials engineer of the [employer] for Metabolic Bone Disease, a [place] (WHO) [facility]. ASSESSMENT: The probability of a major osteoporotic fracture is 19.2% within the next ten years. The probability of a hip fracture is 9.0% within the next ten years. . Your patient Noorah Giammona completed a BMD test on 01/11/2022 using the [place] (software version: 14.10) manufactured by [employer]. The following summarizes the results of our evaluation. Technologist: [facility] PATIENT BIOGRAPHICAL: Name: Arvie, Villarruel Patient ID: [id number] Birth Date: 1946-03-29 Height: 62.0 in. Gender: Female Exam Date: 01/11/2022 Weight: 145.1 lbs. Indications: Advanced Age, Caucasian, Hysterectomy, Oophorectomy Bilateral, Postmenopausal, Tobacco User (Current Smoker) Fractures: Treatments: Vitamin D DENSITOMETRY RESULTS: Site         Region     Measured Date Measured Age WHO Classification Young Adult T-score BMD         %Change vs. Previous Significant Change (*) DualFemur Neck Left 01/11/2020 77.9 Osteopenia -2.4 0.709 g/cm2 -6.5% -  DualFemur Neck Left 10/25/2017 73.7 Osteopenia -2.0 0.758 g/cm2 - - DualFemur Total Mean 01/11/2022 77.9 Osteopenia -1.1 0.870 g/cm2 -2.1% Yes DualFemur Total Mean 10/25/2017 73.7 Normal -0.9 0.889 g/cm2 - - Left  Forearm Radius 33% 01/11/2022 77.9 Normal -0.7 0.812 g/cm2 1.4% - Left Forearm Radius 33% 10/25/2017 73.7 Normal -0.9 0.801 g/cm2 - - ASSESSMENT: The BMD measured at Femur Neck Left is 0.709 g/cm2 with a T-score of -2.4. This patient is considered osteopenic according to [facility]) criteria. The scan quality is good. Lumbar spine was not utilized due to advanced degenerative changes. Compared with prior study, there has been significant decrease in the total hip. World Pharmacologist [facility]) criteria for post-menopausal, Caucasian Women: Normal:                   T-score at or above -1 SD Osteopenia/low bone mass: T-score between -1 and -2.5 SD Osteoporosis:             T-score at or below -2.5 SD RECOMMENDATIONS: 1. All patients should optimize calcium and vitamin D intake. 2. Consider FDA-approved medical therapies in postmenopausal women and men aged 79 years and older, based on the following: a. A hip or vertebral(clinical or morphometric) fracture b. T-score < -2.5 at the femoral neck or spine after appropriate evaluation to exclude secondary causes c. Low bone mass (T-score between -1.0 and -2.5 at the femoral neck or spine) and a 10-year probability of a hip fracture > 3% or a 10-year probability of a major osteoporosis-related fracture > 20% based on the [place]-adapted WHO algorithm 3. Clinician judgment and/or patient preferences may indicate treatment for people with 10-year fracture probabilities above or below these levels FOLLOW-UP: People with diagnosed cases of osteoporosis or at high risk for fracture should have regular bone mineral density tests. For patients eligible for Medicare, routine testing is allowed once every 2 years. The testing frequency can be increased to one year for patients who have rapidly progressing disease, those who are receiving or discontinuing medical therapy to restore bone mass, or have additional risk factors. I have reviewed this report, and agree  with the above findings. [facility] Electronically Signed   By: Dorise Bullion III M.D.   On: 01/11/2022 08:58       Assessment & Plan:   Problem List Items Addressed This Visit     GERD (gastroesophageal reflux disease)    Pepcid.  Follow.  Overall appears to be doing better.       Hypercholesterolemia - Primary    Continue lipitor.  Low cholesterol diet and exercise.  Follow lipid panel and liver function tests.        Relevant Medications   amLODipine (NORVASC) 2.5 MG tablet   Hypertension    Blood pressure on recheck 152/92.  Elevated.  Discussed.  Increased stress.  Given persistent intermittent elevation, would like to start amlodipine 2.'5mg'$  q day.  Follow pressures.  Follow metabolic panel.       Relevant Medications   amLODipine (NORVASC) 2.5 MG tablet   Stress    Increased stress.  Discussed. Does not feel needs any further intervention at this time.  Follow.       Thrombocytosis ([place])    Worked up by hematology.  Follow cbc.       Relevant Orders   CBC with Differential/Platelet   Tobacco abuse    Have discussed the need to quit smoking.  Have also discussed screening CT chest.  Has  declined. Will notify me if changes her mind.          Einar Pheasant, MD

## 2022-04-11 ENCOUNTER — Encounter: Payer: Self-pay | Admitting: Internal Medicine

## 2022-04-11 DIAGNOSIS — I10 Essential (primary) hypertension: Secondary | ICD-10-CM | POA: Insufficient documentation

## 2022-04-11 NOTE — Assessment & Plan Note (Signed)
Worked up by hematology.  Follow cbc.  

## 2022-04-11 NOTE — Assessment & Plan Note (Signed)
Continue lipitor.  Low cholesterol diet and exercise.  Follow lipid panel and liver function tests.   

## 2022-04-11 NOTE — Assessment & Plan Note (Signed)
Increased stress.  Discussed.  Does not feel needs any further intervention at this time.  Follow.  

## 2022-04-11 NOTE — Assessment & Plan Note (Signed)
Pepcid.  Follow.  Overall appears to be doing better.

## 2022-04-11 NOTE — Assessment & Plan Note (Signed)
Have discussed the need to quit smoking.  Have also discussed screening CT chest.  Has declined. Will notify me if changes her mind.

## 2022-04-11 NOTE — Assessment & Plan Note (Signed)
Blood pressure on recheck 152/92.  Elevated.  Discussed.  Increased stress.  Given persistent intermittent elevation, would like to start amlodipine 2.'5mg'$  q day.  Follow pressures.  Follow metabolic panel.

## 2022-04-22 ENCOUNTER — Other Ambulatory Visit: Payer: PPO

## 2022-04-22 ENCOUNTER — Ambulatory Visit: Payer: PPO | Admitting: Internal Medicine

## 2022-04-26 ENCOUNTER — Ambulatory Visit (INDEPENDENT_AMBULATORY_CARE_PROVIDER_SITE_OTHER): Payer: PPO

## 2022-04-26 ENCOUNTER — Ambulatory Visit (INDEPENDENT_AMBULATORY_CARE_PROVIDER_SITE_OTHER): Payer: PPO | Admitting: Internal Medicine

## 2022-04-26 ENCOUNTER — Encounter: Payer: Self-pay | Admitting: Internal Medicine

## 2022-04-26 DIAGNOSIS — R059 Cough, unspecified: Secondary | ICD-10-CM | POA: Diagnosis not present

## 2022-04-26 DIAGNOSIS — R062 Wheezing: Secondary | ICD-10-CM | POA: Diagnosis not present

## 2022-04-26 DIAGNOSIS — D75839 Thrombocytosis, unspecified: Secondary | ICD-10-CM

## 2022-04-26 DIAGNOSIS — F439 Reaction to severe stress, unspecified: Secondary | ICD-10-CM

## 2022-04-26 DIAGNOSIS — Z720 Tobacco use: Secondary | ICD-10-CM | POA: Diagnosis not present

## 2022-04-26 DIAGNOSIS — I10 Essential (primary) hypertension: Secondary | ICD-10-CM

## 2022-04-26 DIAGNOSIS — E7800 Pure hypercholesterolemia, unspecified: Secondary | ICD-10-CM

## 2022-04-26 MED ORDER — CEFDINIR 300 MG PO CAPS: 300.0000 mg | ORAL_CAPSULE | Freq: Two times a day (BID) | ORAL | 0 refills | Status: DC

## 2022-04-26 MED ORDER — PREDNISONE 10 MG PO TABS: ORAL_TABLET | ORAL | 0 refills | Status: DC

## 2022-04-26 NOTE — Progress Notes (Unsigned)
[name], female   DOB: 06-27-1946, 76 y.o.   MRN: [id number]   [name], female    DOB: 06-03-1946, 77 y.o.   MRN: [id number]   Patient here for  Chief Complaint  Patient presents with   Follow-up    2 week follow up   .   HPI Here for a scheduled follow up.  Blood pressure elevated on recent visit.  Started on low dose amlodipine.  Here to follow up regarding her blood pressure.  States blood pressure has been doing ok at home.  Yesterday 417 systolic reading.  No chest pain. Did start with cough 6 days ago.  Was seen at Dr Jimmye Norman office.  Was prescribed zpak and prednisone. Reports Increased head congestion.  Using nasal spray.  Head congestion is better.  No sore throat.  No fever.  Increased cough.  Some coughing fits.  No sob. No chest tightness.  No nausea or vomiting.  No abdominal pain.  No diarrhea.  Increased stress with her husband's health issues. Has good support.  Does not feel needs any further intervention.     Past Medical History:  Diagnosis Date   Chronic bronchitis ([place])    Hypercholesterolemia    Migraines    Past Surgical History:  Procedure Laterality Date   ABDOMINAL HYSTERECTOMY  1979   secondary to fibroids, incidental appendectomy   BREAST EXCISIONAL BIOPSY Right 1979   benign   TONSILLECTOMY  1960   Family History  Problem Relation Age of Onset   Heart disease Father        myocardial infarction - died age 72   Heart disease Mother    Diabetes Mother    Diabetes Maternal Grandmother    Breast cancer Neg Hx    Colon cancer Neg Hx    Social History   Socioeconomic History   Marital status: Married    Spouse name: Not on file   Number of children: 1   Years of education: Not on file   Highest education level: Not on file  Occupational History   Not on file  Tobacco Use   Smoking status: Every Day    Packs/day: 1.00    Years: 50.00    Total pack years: 50.00    Types: Cigarettes    Smokeless tobacco: Never   Tobacco comments:    is going to try electronic cigarettes  Vaping Use   Vaping Use: Never used  Substance and Sexual Activity   Alcohol use: No    Alcohol/week: 0.0 standard drinks of alcohol   Drug use: No   Sexual activity: Not Currently  Other Topics Concern   Not on file  Social History Narrative   Not on file   Social Determinants of Health   Financial Resource Strain: Low Risk  (10/12/2021)   Overall Financial Resource Strain (CARDIA)    Difficulty of Paying Living Expenses: Not hard at all  Food Insecurity: No Food Insecurity (10/12/2021)   Hunger Vital Sign    Worried About Running Out of Food in the Last Year: Never true    Ran Out of Food in the Last Year: Never true  Transportation Needs: No Transportation Needs (10/12/2021)   PRAPARE - Hydrologist (Medical): No    Lack of Transportation (Non-Medical): No  Physical Activity: Not on file  Stress: No Stress Concern Present (10/12/2021)   [place]  Stress Questionnaire    Feeling of Stress : Only a little  Social Connections: Socially Integrated (10/12/2021)   Social Connection and Isolation Panel [NHANES]    Frequency of Communication with Friends and Family: More than three times a week    Frequency of Social Gatherings with Friends and Family: More than three times a week    Attends Religious Services: More than 4 times per year    Active Member of [employer] or Organizations: Yes    Attends Archivist Meetings: 1 to 4 times per year    Marital Status: Married     Review of Systems  Constitutional:  Negative for appetite change and unexpected weight change.  HENT:  Positive for congestion and sinus pressure.   Respiratory:  Positive for cough. Negative for chest tightness and shortness of breath.   Cardiovascular:  Negative for chest pain, palpitations and leg swelling.  Gastrointestinal:  Negative for  abdominal pain, diarrhea, nausea and vomiting.  Genitourinary:  Negative for difficulty urinating and dysuria.  Musculoskeletal:  Negative for joint swelling and myalgias.  Skin:  Negative for color change and rash.  Neurological:  Negative for dizziness, light-headedness and headaches.  Psychiatric/Behavioral:  Negative for agitation and dysphoric mood.        Increased stress as outlined.        Objective:     BP (!) 140/80 (BP Location: Left Arm, Patient Position: Sitting, Cuff Size: Normal)   Pulse 76   Temp 98.1 F (36.7 C) (Oral)   Ht '5\' 3"'$  (1.6 m)   Wt 146 lb 9.6 oz (66.5 kg)   SpO2 94%   BMI 25.97 kg/m  Wt Readings from Last 3 Encounters:  04/26/22 146 lb 9.6 oz (66.5 kg)  04/05/22 147 lb 3.2 oz (66.8 kg)  01/21/22 146 lb (66.2 kg)    Physical Exam Vitals reviewed.  Constitutional:      General: She is not in acute distress.    Appearance: Normal appearance.  HENT:     Head: Normocephalic and atraumatic.     Right Ear: External ear normal.     Left Ear: External ear normal.  Eyes:     General: No scleral icterus.       Right eye: No discharge.        Left eye: No discharge.     Conjunctiva/sclera: Conjunctivae normal.  Neck:     Thyroid: No thyromegaly.  Cardiovascular:     Rate and Rhythm: Normal rate and regular rhythm.  Pulmonary:     Effort: No respiratory distress.     Breath sounds: Normal breath sounds.     Comments: Some faint scattered wheezing noted on exam.  Increased cough with forced expiration.  Abdominal:     General: Bowel sounds are normal.     Palpations: Abdomen is soft.     Tenderness: There is no abdominal tenderness.  Musculoskeletal:        General: No swelling or tenderness.     Cervical back: Neck supple. No tenderness.  Lymphadenopathy:     Cervical: No cervical adenopathy.  Skin:    Findings: No erythema or rash.  Neurological:     Mental Status: She is alert.  Psychiatric:        Mood and Affect: Mood normal.         Behavior: Behavior normal.      Outpatient Encounter Medications as of 04/26/2022  Medication Sig   amLODipine (NORVASC) 2.5 MG tablet Take 1 tablet (  2.5 mg total) by mouth daily.   atorvastatin (LIPITOR) 20 MG tablet Take 1 tablet (20 mg total) by mouth daily.   azelastine (ASTELIN) 0.1 % nasal spray Place 1 spray into both nostrils 2 (two) times daily. Use in each nostril as directed   cefdinir (OMNICEF) 300 MG capsule Take 1 capsule (300 mg total) by mouth 2 (two) times daily.   Ibuprofen 200 MG CAPS Take by mouth.   predniSONE (DELTASONE) 10 MG tablet Take 4 tablets x 1 day and then decrease by 1/2 tablet per day until down to zero mg.   No facility-administered encounter medications on file as of 04/26/2022.     Lab Results  Component Value Date   WBC 7.0 12/02/2021   HGB 13.7 12/02/2021   HCT 41.2 12/02/2021   PLT 405.0 (H) 12/02/2021   GLUCOSE 90 12/02/2021   CHOL 177 12/02/2021   TRIG 136.0 12/02/2021   HDL 70.20 12/02/2021   LDLDIRECT 80.0 08/11/2021   LDLCALC 80 12/02/2021   ALT 15 12/02/2021   AST 16 12/02/2021   NA 140 12/02/2021   K 4.1 12/02/2021   CL 105 12/02/2021   CREATININE 0.70 12/02/2021   BUN 19 12/02/2021   CO2 29 12/02/2021   TSH 2.57 08/11/2021   MICROALBUR 0.7 01/24/2017    Mammogram 3D SCREEN BREAST BILATERAL  Result Date: 01/11/2022 CLINICAL DATA:  Screening. EXAM: DIGITAL SCREENING BILATERAL MAMMOGRAM WITH TOMOSYNTHESIS AND CAD TECHNIQUE: Bilateral screening digital craniocaudal and mediolateral oblique mammograms were obtained. Bilateral screening digital breast tomosynthesis was performed. The images were evaluated with computer-aided detection. COMPARISON:  Previous exam(s). ACR Breast Density Category b: There are scattered areas of fibroglandular density. FINDINGS: There are no findings suspicious for malignancy. IMPRESSION: No mammographic evidence of malignancy. A result letter of this screening mammogram will be mailed directly to the  patient. RECOMMENDATION: Screening mammogram in one year. (Code:SM-B-01Y) BI-RADS CATEGORY  1: Negative. Electronically Signed   By: Marin Olp M.D.   On: 01/11/2022 16:40   DEXAScan  Result Date: 01/11/2022 EXAM: DUAL X-RAY ABSORPTIOMETRY (DXA) FOR BONE MINERAL DENSITY IMPRESSION: Dear Dr. Nicki Reaper, Your patient SHANON SEAWRIGHT completed a FRAX assessment on 01/11/2022 using the [place] (analysis version: 14.10) manufactured by [employer]. The following summarizes the results of our evaluation. PATIENT BIOGRAPHICAL: Name: Christena, Sunderlin Patient ID: [id number] Birth Date: 08-May-1946 Height:    62.0 in. Gender:     [age]    [age]       Weight:    145.1 lbs. Ethnicity:  White                            Exam Date: 01/11/2022 FRAX* RESULTS:  (version: 3.5) 10-year Probability of Fracture1 Major Osteoporotic Fracture2 Hip Fracture 19.2% 9.0% Population: [place] (Caucasian) Risk Factors: Tobacco User (Current Smoker) Based on Femur (Left) Neck BMD 1 -The 10-year probability of fracture may be lower than reported if the patient has received treatment. 2 -Major Osteoporotic Fracture: Clinical Spine, Forearm, Hip or Shoulder *FRAX is a Materials engineer of the [employer] for Metabolic Bone Disease, a [place] (WHO) [facility]. ASSESSMENT: The probability of a major osteoporotic fracture is 19.2% within the next ten years. The probability of a hip fracture is 9.0% within the next ten years. . Your patient Jeniya Flannigan completed a BMD test on 01/11/2022 using the [facility]  DXA System (software version: 14.10) manufactured by [employer]. The following summarizes the results of our evaluation. Technologist: [facility] PATIENT BIOGRAPHICAL: Name: Jing, Howatt Patient ID: [id number] Birth Date: [birth year] Height: 62.0 in. Gender: Female Exam Date: 01/11/2022 Weight: 145.1 lbs. Indications: Advanced Age, Caucasian,  Hysterectomy, Oophorectomy Bilateral, Postmenopausal, Tobacco User (Current Smoker) Fractures: Treatments: Vitamin D DENSITOMETRY RESULTS: Site         Region     Measured Date Measured Age WHO Classification Young Adult T-score BMD         %Change vs. Previous Significant Change (*) DualFemur Neck Left 01/11/2020 77.9 Osteopenia -2.4 0.709 g/cm2 -6.5% - DualFemur Neck Left 10/25/2017 73.7 Osteopenia -2.0 0.758 g/cm2 - - DualFemur Total Mean 01/11/2022 77.9 Osteopenia -1.1 0.870 g/cm2 -2.1% Yes DualFemur Total Mean 10/25/2017 73.7 Normal -0.9 0.889 g/cm2 - - Left Forearm Radius 33% 01/11/2022 77.9 Normal -0.7 0.812 g/cm2 1.4% - Left Forearm Radius 33% 10/25/2017 73.7 Normal -0.9 0.801 g/cm2 - - ASSESSMENT: The BMD measured at Femur Neck Left is 0.709 g/cm2 with a T-score of -2.4. This patient is considered osteopenic according to [facility]) criteria. The scan quality is good. Lumbar spine was not utilized due to advanced degenerative changes. Compared with prior study, there has been significant decrease in the total hip. World Pharmacologist [facility]) criteria for post-menopausal, Caucasian Women: Normal:                   T-score at or above -1 SD Osteopenia/low bone mass: T-score between -1 and -2.5 SD Osteoporosis:             T-score at or below -2.5 SD RECOMMENDATIONS: 1. All patients should optimize calcium and vitamin D intake. 2. Consider FDA-approved medical therapies in postmenopausal women and men aged 68 years and older, based on the following: a. A hip or vertebral(clinical or morphometric) fracture b. T-score < -2.5 at the femoral neck or spine after appropriate evaluation to exclude secondary causes c. Low bone mass (T-score between -1.0 and -2.5 at the femoral neck or spine) and a 10-year probability of a hip fracture > 3% or a 10-year probability of a major osteoporosis-related fracture > 20% based on the [place]-adapted WHO algorithm 3. Clinician judgment and/or patient  preferences may indicate treatment for people with 10-year fracture probabilities above or below these levels FOLLOW-UP: People with diagnosed cases of osteoporosis or at high risk for fracture should have regular bone mineral density tests. For patients eligible for Medicare, routine testing is allowed once every 2 years. The testing frequency can be increased to one year for patients who have rapidly progressing disease, those who are receiving or discontinuing medical therapy to restore bone mass, or have additional risk factors. I have reviewed this report, and agree with the above findings. [facility] Electronically Signed   By: Dorise Bullion III M.D.   On: 01/11/2022 08:58       Assessment & Plan:   Problem List Items Addressed This Visit     Cough    Persistent cough, congestion and wheezing as outlined.  Head congestion better.  No chest pain or sob.  Completed zpak.  Add omnicef as directed.  Prednisone taper.  Check cxr.  Continue mucinex and nasal spray.  Follow.  Call with update.  Wants to hold on covid testing.       Relevant Orders   DG Chest 2 View (Completed)   Hypercholesterolemia    Continue lipitor.  Low  cholesterol diet and exercise.  Follow lipid panel and liver function tests.        Hypertension    Blood pressure appears to be doing well at home.  Improved check in office, but still elevated above goal.  She is sick.  Increased stress.  Continue amlodipine 2.'5mg'$  q day.  Will check and record blood pressures at home.  Send in readings.  Schedule f/u soon to reassess.  Follow metabolic panel.       Stress    Increased stress.  Discussed. Does not feel needs any further intervention at this time. Has good support.  Follow.       Thrombocytosis ([place])    Worked up by hematology.  Follow cbc.       Tobacco abuse    Have discussed the need to quit smoking.  Have also discussed screening CT chest.  Has declined. Will notify me if changes her mind.           Einar Pheasant, MD

## 2022-04-27 ENCOUNTER — Encounter: Payer: Self-pay | Admitting: Internal Medicine

## 2022-04-27 NOTE — Assessment & Plan Note (Signed)
Blood pressure appears to be doing well at home.  Improved check in office, but still elevated above goal.  She is sick.  Increased stress.  Continue amlodipine 2.'5mg'$  q day.  Will check and record blood pressures at home.  Send in readings.  Schedule f/u soon to reassess.  Follow metabolic panel.

## 2022-04-27 NOTE — Assessment & Plan Note (Signed)
Continue lipitor.  Low cholesterol diet and exercise.  Follow lipid panel and liver function tests.   

## 2022-04-27 NOTE — Assessment & Plan Note (Signed)
Have discussed the need to quit smoking.  Have also discussed screening CT chest.  Has declined. Will notify me if changes her mind.

## 2022-04-27 NOTE — Assessment & Plan Note (Addendum)
Increased stress.  Discussed. Does not feel needs any further intervention at this time. Has good support.  Follow.  

## 2022-04-27 NOTE — Assessment & Plan Note (Addendum)
Persistent cough, congestion and wheezing as outlined.  Head congestion better.  No chest pain or sob.  Completed zpak.  Add omnicef as directed.  Prednisone taper.  Check cxr.  Continue mucinex and nasal spray.  Follow.  Call with update.  Wants to hold on covid testing.

## 2022-04-27 NOTE — Assessment & Plan Note (Signed)
Worked up by hematology.  Follow cbc.  

## 2022-05-24 ENCOUNTER — Encounter: Payer: Self-pay | Admitting: Internal Medicine

## 2022-05-24 ENCOUNTER — Other Ambulatory Visit: Payer: PPO

## 2022-05-24 ENCOUNTER — Ambulatory Visit (INDEPENDENT_AMBULATORY_CARE_PROVIDER_SITE_OTHER): Payer: PPO | Admitting: Internal Medicine

## 2022-05-24 VITALS — BP 136/80 | HR 86 | Temp 98.3°F | Resp 16 | Ht 63.0 in | Wt 146.4 lb

## 2022-05-24 DIAGNOSIS — F439 Reaction to severe stress, unspecified: Secondary | ICD-10-CM

## 2022-05-24 DIAGNOSIS — R059 Cough, unspecified: Secondary | ICD-10-CM | POA: Diagnosis not present

## 2022-05-24 DIAGNOSIS — Z122 Encounter for screening for malignant neoplasm of respiratory organs: Secondary | ICD-10-CM

## 2022-05-24 DIAGNOSIS — Z716 Tobacco abuse counseling: Secondary | ICD-10-CM | POA: Diagnosis not present

## 2022-05-24 DIAGNOSIS — E7800 Pure hypercholesterolemia, unspecified: Secondary | ICD-10-CM

## 2022-05-24 DIAGNOSIS — I10 Essential (primary) hypertension: Secondary | ICD-10-CM | POA: Diagnosis not present

## 2022-05-24 MED ORDER — ALBUTEROL SULFATE HFA 108 (90 BASE) MCG/ACT IN AERS: 2.0000 | INHALATION_SPRAY | Freq: Four times a day (QID) | RESPIRATORY_TRACT | 1 refills | Status: AC | PRN

## 2022-05-24 MED ORDER — DOXYCYCLINE HYCLATE 100 MG PO TABS: 100.0000 mg | ORAL_TABLET | Freq: Two times a day (BID) | ORAL | 0 refills | Status: DC

## 2022-05-24 MED ORDER — PREDNISONE 10 MG PO TABS: ORAL_TABLET | ORAL | 0 refills | Status: DC

## 2022-05-24 NOTE — Patient Instructions (Signed)
Saline nasal spray - flush nose at least 2x/day  Nasacort nasal spray - 2 sprays each nostril one time per day.  Do this in the evening.   Take a probiotic daily while on the antibiotic and for two weeks after completing the antibiotic.

## 2022-05-24 NOTE — Progress Notes (Signed)
[name], female   DOB: 1945-11-02, 76 y.o.   MRN: [id number]   [name], female    DOB: [birth year], 76 y.o.   MRN: [id number]  Patient here for  Chief Complaint  Patient presents with   Follow-up   Hypertension   Hyperlipidemia   .   HPI Here to follow up regarding her blood pressure, cholesterol and recent cough. On amlodipine and tolerating.  Reports blood pressure at home under good control.  Increased stress.  Discussed.  Does not feel needs any further intervention.  Does report persistent cough and congestion.  Was previously evaluated by Dr Jimmye Norman.  Was prescribed zpak and prednisone.  Was some better last visit.  Omnicef added.  CXR - ok.  Comes in today and reports runny nose with blood tinged mucus.  Some drainage and hoarseness.  Using nasacort occasionally.  Discussed mucinex/zyrtec.  No chest pain.  Decreased taste.  No abdominal pain.     Past Medical History:  Diagnosis Date   Chronic bronchitis ([place])    Hypercholesterolemia    Migraines    Past Surgical History:  Procedure Laterality Date   ABDOMINAL HYSTERECTOMY  1979   secondary to fibroids, incidental appendectomy   BREAST EXCISIONAL BIOPSY Right 1979   benign   TONSILLECTOMY  1960   Family History  Problem Relation Age of Onset   Heart disease Father        myocardial infarction - died age 24   Heart disease Mother    Diabetes Mother    Diabetes Maternal Grandmother    Breast cancer Neg Hx    Colon cancer Neg Hx    Social History   Socioeconomic History   Marital status: Married    Spouse name: Not on file   Number of children: 1   Years of education: Not on file   Highest education level: Not on file  Occupational History   Not on file  Tobacco Use   Smoking status: Every Day    Packs/day: 1.00    Years: 50.00    Total pack years: 50.00    Types: Cigarettes   Smokeless tobacco: Never   Tobacco comments:    is going to try  electronic cigarettes  Vaping Use   Vaping Use: Never used  Substance and Sexual Activity   Alcohol use: No    Alcohol/week: 0.0 standard drinks of alcohol   Drug use: No   Sexual activity: Not Currently  Other Topics Concern   Not on file  Social History Narrative   Not on file   Social Determinants of Health   Financial Resource Strain: Low Risk  (10/12/2021)   Overall Financial Resource Strain (CARDIA)    Difficulty of Paying Living Expenses: Not hard at all  Food Insecurity: No Food Insecurity (10/12/2021)   Hunger Vital Sign    Worried About Running Out of Food in the Last Year: Never true    Ran Out of Food in the Last Year: Never true  Transportation Needs: No Transportation Needs (10/12/2021)   PRAPARE - Hydrologist (Medical): No    Lack of Transportation (Non-Medical): No  Physical Activity: Not on file  Stress: No Stress Concern Present (10/12/2021)   Welcome    Feeling of Stress : Only a little  Social Connections: Socially Integrated (10/12/2021)   Social Connection and Isolation Panel [NHANES]  Frequency of Communication with Friends and Family: More than three times a week    Frequency of Social Gatherings with Friends and Family: More than three times a week    Attends Religious Services: More than 4 times per year    Active Member of [employer] or Organizations: Yes    Attends Archivist Meetings: 1 to 4 times per year    Marital Status: Married     Review of Systems  Constitutional:  Negative for appetite change and unexpected weight change.  HENT:         Increased drainage.  Hoarseness.    Respiratory:  Negative for cough, chest tightness and shortness of breath.   Cardiovascular:  Negative for chest pain, palpitations and leg swelling.  Gastrointestinal:  Negative for abdominal pain, diarrhea, nausea and vomiting.  Genitourinary:  Negative for  difficulty urinating and dysuria.  Musculoskeletal:  Negative for joint swelling and myalgias.  Skin:  Negative for color change and rash.  Neurological:  Negative for dizziness and headaches.  Psychiatric/Behavioral:  Negative for agitation and dysphoric mood.        Increased stress.        Objective:     BP 136/80 (BP Location: Left Arm, Patient Position: Sitting, Cuff Size: Small)   Pulse 86   Temp 98.3 F (36.8 C) (Temporal)   Resp 16   Ht '5\' 3"'$  (1.6 m)   Wt 146 lb 6.4 oz (66.4 kg)   SpO2 95%   BMI 25.93 kg/m  Wt Readings from Last 3 Encounters:  05/24/22 146 lb 6.4 oz (66.4 kg)  04/26/22 146 lb 9.6 oz (66.5 kg)  04/05/22 147 lb 3.2 oz (66.8 kg)    Physical Exam Vitals reviewed.  Constitutional:      General: She is not in acute distress.    Appearance: Normal appearance.  HENT:     Head: Normocephalic and atraumatic.     Right Ear: External ear normal.     Left Ear: External ear normal.  Eyes:     General: No scleral icterus.       Right eye: No discharge.        Left eye: No discharge.     Conjunctiva/sclera: Conjunctivae normal.  Neck:     Thyroid: No thyromegaly.  Cardiovascular:     Rate and Rhythm: Normal rate and regular rhythm.  Pulmonary:     Effort: No respiratory distress.     Breath sounds: Normal breath sounds. No wheezing.     Comments: Increased cough with expiration.  Abdominal:     General: Bowel sounds are normal.     Palpations: Abdomen is soft.     Tenderness: There is no abdominal tenderness.  Musculoskeletal:        General: No swelling or tenderness.     Cervical back: Neck supple. No tenderness.  Lymphadenopathy:     Cervical: No cervical adenopathy.  Skin:    Findings: No erythema or rash.  Neurological:     Mental Status: She is alert.  Psychiatric:        Mood and Affect: Mood normal.        Behavior: Behavior normal.      Outpatient Encounter Medications as of 05/24/2022  Medication Sig   albuterol (VENTOLIN HFA)  108 (90 Base) MCG/ACT inhaler Inhale 2 puffs into the lungs every 6 (six) hours as needed for wheezing or shortness of breath.   amLODipine (NORVASC) 2.5 MG tablet Take 1 tablet (2.5 mg  total) by mouth daily.   atorvastatin (LIPITOR) 20 MG tablet Take 1 tablet (20 mg total) by mouth daily.   azelastine (ASTELIN) 0.1 % nasal spray Place 1 spray into both nostrils 2 (two) times daily. Use in each nostril as directed   doxycycline (VIBRA-TABS) 100 MG tablet Take 1 tablet (100 mg total) by mouth 2 (two) times daily.   Ibuprofen 200 MG CAPS Take by mouth.   predniSONE (DELTASONE) 10 MG tablet Take 6 tablets x 1 day and then decrease by 1/2 tablet per day until down to zero mg.   [DISCONTINUED] cefdinir (OMNICEF) 300 MG capsule Take 1 capsule (300 mg total) by mouth 2 (two) times daily.   [DISCONTINUED] predniSONE (DELTASONE) 10 MG tablet Take 4 tablets x 1 day and then decrease by 1/2 tablet per day until down to zero mg. (Patient not taking: Reported on 05/24/2022)   No facility-administered encounter medications on file as of 05/24/2022.     Lab Results  Component Value Date   WBC 7.0 12/02/2021   HGB 13.7 12/02/2021   HCT 41.2 12/02/2021   PLT 405.0 (H) 12/02/2021   GLUCOSE 90 12/02/2021   CHOL 177 12/02/2021   TRIG 136.0 12/02/2021   HDL 70.20 12/02/2021   LDLDIRECT 80.0 08/11/2021   LDLCALC 80 12/02/2021   ALT 15 12/02/2021   AST 16 12/02/2021   NA 140 12/02/2021   K 4.1 12/02/2021   CL 105 12/02/2021   CREATININE 0.70 12/02/2021   BUN 19 12/02/2021   CO2 29 12/02/2021   TSH 2.57 08/11/2021   MICROALBUR 0.7 01/24/2017    Mammogram 3D SCREEN BREAST BILATERAL  Result Date: 01/11/2022 CLINICAL DATA:  Screening. EXAM: DIGITAL SCREENING BILATERAL MAMMOGRAM WITH TOMOSYNTHESIS AND CAD TECHNIQUE: Bilateral screening digital craniocaudal and mediolateral oblique mammograms were obtained. Bilateral screening digital breast tomosynthesis was performed. The images were evaluated with  computer-aided detection. COMPARISON:  Previous exam(s). ACR Breast Density Category b: There are scattered areas of fibroglandular density. FINDINGS: There are no findings suspicious for malignancy. IMPRESSION: No mammographic evidence of malignancy. A result letter of this screening mammogram will be mailed directly to the patient. RECOMMENDATION: Screening mammogram in one year. (Code:SM-B-01Y) BI-RADS CATEGORY  1: Negative. Electronically Signed   By: Marin Olp M.D.   On: 01/11/2022 16:40   DEXAScan  Result Date: 01/11/2022 EXAM: DUAL X-RAY ABSORPTIOMETRY (DXA) FOR BONE MINERAL DENSITY IMPRESSION: Dear Dr. Nicki Reaper, Your patient KIRBY ARGUETA completed a FRAX assessment on 01/11/2022 using the Dixie (analysis version: 14.10) manufactured by [employer]. The following summarizes the results of our evaluation. PATIENT BIOGRAPHICAL: Name: Sharnell, Knight Patient ID: [id number] Birth Date: 1945/10/18 Height:    62.0 in. [age]:     [age]       Weight:    145.1 lbs. Ethnicity:  White                            Exam Date: 01/11/2022 FRAX* RESULTS:  (version: 3.5) 10-year Probability of Fracture1 Major Osteoporotic Fracture2 Hip Fracture 19.2% 9.0% Population: [place] (Caucasian) Risk Factors: Tobacco User (Current Smoker) Based on Femur (Left) Neck BMD 1 -The 10-year probability of fracture may be lower than reported if the patient has received treatment. 2 -Major Osteoporotic Fracture: Clinical Spine, Forearm, Hip or Shoulder *FRAX is a Materials engineer of the [employer] for Metabolic Bone  Disease, a World Pharmacologist (WHO) [facility]. ASSESSMENT: The probability of a major osteoporotic fracture is 19.2% within the next ten years. The probability of a hip fracture is 9.0% within the next ten years. . Your patient Loria Lacina completed a BMD test on 01/11/2022 using the Melissa (software version: 14.10)  manufactured by [employer]. The following summarizes the results of our evaluation. Technologist: [facility] PATIENT BIOGRAPHICAL: Name: Terrian, Ridlon Patient ID: [id number] Birth Date: 1946-06-30 Height: 62.0 in. Gender: Female Exam Date: 01/11/2022 Weight: 145.1 lbs. Indications: Advanced Age, Caucasian, Hysterectomy, Oophorectomy Bilateral, Postmenopausal, Tobacco User (Current Smoker) Fractures: Treatments: Vitamin D DENSITOMETRY RESULTS: Site         Region     Measured Date Measured Age WHO Classification Young Adult T-score BMD         %Change vs. Previous Significant Change (*) DualFemur Neck Left 01/11/2020 77.9 Osteopenia -2.4 0.709 g/cm2 -6.5% - DualFemur Neck Left 10/25/2017 73.7 Osteopenia -2.0 0.758 g/cm2 - - DualFemur Total Mean 01/11/2022 77.9 Osteopenia -1.1 0.870 g/cm2 -2.1% Yes DualFemur Total Mean 10/25/2017 73.7 Normal -0.9 0.889 g/cm2 - - Left Forearm Radius 33% 01/11/2022 77.9 Normal -0.7 0.812 g/cm2 1.4% - Left Forearm Radius 33% 10/25/2017 73.7 Normal -0.9 0.801 g/cm2 - - ASSESSMENT: The BMD measured at Femur Neck Left is 0.709 g/cm2 with a T-score of -2.4. This patient is considered osteopenic according to [facility]) criteria. The scan quality is good. Lumbar spine was not utilized due to advanced degenerative changes. Compared with prior study, there has been significant decrease in the total hip. World Pharmacologist [facility]) criteria for post-menopausal, Caucasian Women: Normal:                   T-score at or above -1 SD Osteopenia/low bone mass: T-score between -1 and -2.5 SD Osteoporosis:             T-score at or below -2.5 SD RECOMMENDATIONS: 1. All patients should optimize calcium and vitamin D intake. 2. Consider FDA-approved medical therapies in postmenopausal women and men aged 33 years and older, based on the following: a. A hip or vertebral(clinical or morphometric) fracture b. T-score < -2.5 at the femoral neck or spine after appropriate  evaluation to exclude secondary causes c. Low bone mass (T-score between -1.0 and -2.5 at the femoral neck or spine) and a 10-year probability of a hip fracture > 3% or a 10-year probability of a major osteoporosis-related fracture > 20% based on the [place]-adapted WHO algorithm 3. Clinician judgment and/or patient preferences may indicate treatment for people with 10-year fracture probabilities above or below these levels FOLLOW-UP: People with diagnosed cases of osteoporosis or at high risk for fracture should have regular bone mineral density tests. For patients eligible for Medicare, routine testing is allowed once every 2 years. The testing frequency can be increased to one year for patients who have rapidly progressing disease, those who are receiving or discontinuing medical therapy to restore bone mass, or have additional risk factors. I have reviewed this report, and agree with the above findings. [facility] Electronically Signed   By: Dorise Bullion III M.D.   On: 01/11/2022 08:58       Assessment & Plan:   Problem List Items Addressed This Visit     Cough    Persistent increased cough and congestion.  Recent cxr ok.  Saline nasal spray and steroid nasal spray as directed.  Doxycycline.  Prednisone taper  as directed.  Follow.  Call with update.  Albuterol inhaler prn.       Hypercholesterolemia    Continue lipitor.  Low cholesterol diet and exercise.  Follow lipid panel and liver function tests.        Hypertension    Previously started on amlodipine.  Reports blood pressure better at home.  Elevated today.  Is sick.  Feels related.  Wants to hold on increasing medication.  Follow pressures.  If persistent elevation, increase amlodipine.        Stress    Increased stress.  Discussed. Does not feel needs any further intervention at this time. Has good support.  Follow.       Tobacco abuse counseling    Discussed with her regarding the need to quit smoking.  She declines  to quit at this time.  Follow.        Other Visit Diagnoses     Screening for lung cancer    -  Primary        Einar Pheasant, MD

## 2022-05-30 ENCOUNTER — Encounter: Payer: Self-pay | Admitting: Internal Medicine

## 2022-05-30 NOTE — Assessment & Plan Note (Signed)
Continue lipitor.  Low cholesterol diet and exercise.  Follow lipid panel and liver function tests.   

## 2022-05-30 NOTE — Assessment & Plan Note (Signed)
Previously started on amlodipine.  Reports blood pressure better at home.  Elevated today.  Is sick.  Feels related.  Wants to hold on increasing medication.  Follow pressures.  If persistent elevation, increase amlodipine.

## 2022-05-30 NOTE — Assessment & Plan Note (Signed)
Persistent increased cough and congestion.  Recent cxr ok.  Saline nasal spray and steroid nasal spray as directed.  Doxycycline.  Prednisone taper as directed.  Follow.  Call with update.  Albuterol inhaler prn.

## 2022-05-30 NOTE — Assessment & Plan Note (Signed)
Discussed with her regarding the need to quit smoking.  She declines to quit at this time.  Follow.

## 2022-05-30 NOTE — Assessment & Plan Note (Signed)
Increased stress.  Discussed. Does not feel needs any further intervention at this time. Has good support.  Follow.

## 2022-05-31 ENCOUNTER — Other Ambulatory Visit (INDEPENDENT_AMBULATORY_CARE_PROVIDER_SITE_OTHER): Payer: PPO

## 2022-05-31 DIAGNOSIS — R030 Elevated blood-pressure reading, without diagnosis of hypertension: Secondary | ICD-10-CM | POA: Diagnosis not present

## 2022-05-31 DIAGNOSIS — E7800 Pure hypercholesterolemia, unspecified: Secondary | ICD-10-CM | POA: Diagnosis not present

## 2022-05-31 DIAGNOSIS — D75839 Thrombocytosis, unspecified: Secondary | ICD-10-CM | POA: Diagnosis not present

## 2022-05-31 LAB — CBC WITH DIFFERENTIAL/PLATELET
Basophils Absolute: 0 10*3/uL (ref 0.0–0.1)
Basophils Relative: 0.3 % (ref 0.0–3.0)
Eosinophils Absolute: 0.1 10*3/uL (ref 0.0–0.7)
Eosinophils Relative: 1 % (ref 0.0–5.0)
HCT: 39.8 % (ref 36.0–46.0)
Hemoglobin: 13.3 g/dL (ref 12.0–15.0)
Lymphocytes Relative: 35 % (ref 12.0–46.0)
Lymphs Abs: 4.4 10*3/uL — ABNORMAL HIGH (ref 0.7–4.0)
MCHC: 33.4 g/dL (ref 30.0–36.0)
MCV: 97.1 fl (ref 78.0–100.0)
Monocytes Absolute: 1.2 10*3/uL — ABNORMAL HIGH (ref 0.1–1.0)
Monocytes Relative: 9.3 % (ref 3.0–12.0)
Neutro Abs: 6.7 10*3/uL (ref 1.4–7.7)
Neutrophils Relative %: 54.4 % (ref 43.0–77.0)
Platelets: 518 10*3/uL — ABNORMAL HIGH (ref 150.0–400.0)
RBC: 4.1 Mil/uL (ref 3.87–5.11)
RDW: 14.2 % (ref 11.5–15.5)
WBC: 12.4 10*3/uL — ABNORMAL HIGH (ref 4.0–10.5)

## 2022-05-31 LAB — BASIC METABOLIC PANEL
BUN: 25 mg/dL — ABNORMAL HIGH (ref 6–23)
CO2: 29 mEq/L (ref 19–32)
Calcium: 9.7 mg/dL (ref 8.4–10.5)
Chloride: 103 mEq/L (ref 96–112)
Creatinine, Ser: 0.76 mg/dL (ref 0.40–1.20)
GFR: 75.12 mL/min (ref >60.00)
Glucose, Bld: 87 mg/dL (ref 70–99)
Potassium: 4.1 mEq/L (ref 3.5–5.1)
Sodium: 139 mEq/L (ref 135–145)

## 2022-05-31 LAB — LIPID PANEL
Cholesterol: 259 mg/dL — ABNORMAL HIGH (ref 0–200)
HDL: 116.7 mg/dL (ref >39.00)
LDL Cholesterol: 114 mg/dL — ABNORMAL HIGH (ref 0–99)
NonHDL: 142.17
Total CHOL/HDL Ratio: 2
Triglycerides: 140 mg/dL (ref 0.0–149.0)
VLDL: 28 mg/dL (ref 0.0–40.0)

## 2022-05-31 LAB — HEPATIC FUNCTION PANEL
ALT: 13 U/L (ref 0–35)
AST: 13 U/L (ref 0–37)
Albumin: 4.4 g/dL (ref 3.5–5.2)
Alkaline Phosphatase: 76 U/L (ref 39–117)
Bilirubin, Direct: 0.1 mg/dL (ref 0.0–0.3)
Total Bilirubin: 0.7 mg/dL (ref 0.2–1.2)
Total Protein: 6.6 g/dL (ref 6.0–8.3)

## 2022-06-16 ENCOUNTER — Other Ambulatory Visit: Payer: Self-pay

## 2022-06-16 ENCOUNTER — Telehealth: Payer: Self-pay | Admitting: Internal Medicine

## 2022-06-16 DIAGNOSIS — D72829 Elevated white blood cell count, unspecified: Secondary | ICD-10-CM

## 2022-06-16 NOTE — Telephone Encounter (Signed)
ordered

## 2022-06-16 NOTE — Telephone Encounter (Signed)
Patient has a lab appointment 06/23/2022, there are no orders in.

## 2022-06-23 ENCOUNTER — Other Ambulatory Visit (INDEPENDENT_AMBULATORY_CARE_PROVIDER_SITE_OTHER): Payer: PPO

## 2022-06-23 DIAGNOSIS — D72829 Elevated white blood cell count, unspecified: Secondary | ICD-10-CM

## 2022-06-23 LAB — CBC WITH DIFFERENTIAL/PLATELET
Basophils Absolute: 0.1 10*3/uL (ref 0.0–0.1)
Basophils Relative: 0.8 % (ref 0.0–3.0)
Eosinophils Absolute: 1.2 10*3/uL — ABNORMAL HIGH (ref 0.0–0.7)
Eosinophils Relative: 12.4 % — ABNORMAL HIGH (ref 0.0–5.0)
HCT: 39.7 % (ref 36.0–46.0)
Hemoglobin: 13.5 g/dL (ref 12.0–15.0)
Lymphocytes Relative: 24.7 % (ref 12.0–46.0)
Lymphs Abs: 2.4 10*3/uL (ref 0.7–4.0)
MCHC: 34.1 g/dL (ref 30.0–36.0)
MCV: 97.5 fl (ref 78.0–100.0)
Monocytes Absolute: 0.6 10*3/uL (ref 0.1–1.0)
Monocytes Relative: 6.4 % (ref 3.0–12.0)
Neutro Abs: 5.4 10*3/uL (ref 1.4–7.7)
Neutrophils Relative %: 55.7 % (ref 43.0–77.0)
Platelets: 455 10*3/uL — ABNORMAL HIGH (ref 150.0–400.0)
RBC: 4.07 Mil/uL (ref 3.87–5.11)
RDW: 13.8 % (ref 11.5–15.5)
WBC: 9.8 10*3/uL (ref 4.0–10.5)

## 2022-08-02 ENCOUNTER — Ambulatory Visit (INDEPENDENT_AMBULATORY_CARE_PROVIDER_SITE_OTHER): Payer: PPO | Admitting: Internal Medicine

## 2022-08-02 ENCOUNTER — Encounter: Payer: Self-pay | Admitting: Internal Medicine

## 2022-08-02 VITALS — BP 138/88 | HR 68 | Temp 98.2°F | Resp 18 | Ht 63.0 in | Wt 152.2 lb

## 2022-08-02 DIAGNOSIS — D75839 Thrombocytosis, unspecified: Secondary | ICD-10-CM

## 2022-08-02 DIAGNOSIS — E7800 Pure hypercholesterolemia, unspecified: Secondary | ICD-10-CM

## 2022-08-02 DIAGNOSIS — R059 Cough, unspecified: Secondary | ICD-10-CM | POA: Diagnosis not present

## 2022-08-02 DIAGNOSIS — I10 Essential (primary) hypertension: Secondary | ICD-10-CM

## 2022-08-02 DIAGNOSIS — Z720 Tobacco use: Secondary | ICD-10-CM | POA: Diagnosis not present

## 2022-08-02 DIAGNOSIS — F439 Reaction to severe stress, unspecified: Secondary | ICD-10-CM | POA: Diagnosis not present

## 2022-08-02 MED ORDER — ATORVASTATIN CALCIUM 20 MG PO TABS: 20.0000 mg | ORAL_TABLET | Freq: Every day | ORAL | 3 refills | Status: DC

## 2022-08-02 MED ORDER — AMLODIPINE BESYLATE 2.5 MG PO TABS: 2.5000 mg | ORAL_TABLET | Freq: Every day | ORAL | 1 refills | Status: DC

## 2022-08-02 NOTE — Progress Notes (Unsigned)
[name], female   DOB: Mar 07, 1946, 77 y.o.   MRN: [id number]   [name], female    DOB: 09/26/1945, 77 y.o.   MRN: [id number]  Patient here for  Chief Complaint  Patient presents with   Medical Management of Chronic Issues   Hypertension   Cough   .   HPI Here to follow up regarding her blood pressure, cholesterol and recent cough.  Was having persistent cough and congestion.  Was previously evaluated by Dr Jimmye Norman.  Was prescribed zpak and prednisone.  Omnicef added.  CXR - ok. Last visit, given persistence, I prescribed prednisone taper and doxycycline.  Was also instructed to use saline nasal spray and steroid nasal spray.  Has albuterol to take prn.   Reports cough and congestion better - resolved.  Breathing stable. Blood pressure elevated last visit.  Felt related to being sick.  Was taking amlodipine.  Off now due to minimal ankle swelling and ringing in ears.  She was also on prednisone at that time.  Is having no swelling now.  Ringing in ears - stable.  No increase ringing now.  Discussed restarting amlodipine.     Past Medical History:  Diagnosis Date   Chronic bronchitis ([place])    Hypercholesterolemia    Migraines    Past Surgical History:  Procedure Laterality Date   ABDOMINAL HYSTERECTOMY  1979   secondary to fibroids, incidental appendectomy   BREAST EXCISIONAL BIOPSY Right 1979   benign   TONSILLECTOMY  1960   Family History  Problem Relation Age of Onset   Heart disease Father        myocardial infarction - died age 33   Heart disease Mother    Diabetes Mother    Diabetes Maternal Grandmother    Breast cancer Neg Hx    Colon cancer Neg Hx    Social History   Socioeconomic History   Marital status: Married    Spouse name: Not on file   Number of children: 1   Years of education: Not on file   Highest education level: Not on file  Occupational History   Not on file  Tobacco Use   Smoking status:  Every Day    Packs/day: 1.00    Years: 50.00    Total pack years: 50.00    Types: Cigarettes   Smokeless tobacco: Never   Tobacco comments:    is going to try electronic cigarettes  Vaping Use   Vaping Use: Never used  Substance and Sexual Activity   Alcohol use: No    Alcohol/week: 0.0 standard drinks of alcohol   Drug use: No   Sexual activity: Not Currently  Other Topics Concern   Not on file  Social History Narrative   Not on file   Social Determinants of Health   Financial Resource Strain: Low Risk  (10/12/2021)   Overall Financial Resource Strain (CARDIA)    Difficulty of Paying Living Expenses: Not hard at all  Food Insecurity: No Food Insecurity (10/12/2021)   Hunger Vital Sign    Worried About Running Out of Food in the Last Year: Never true    Ran Out of Food in the Last Year: Never true  Transportation Needs: No Transportation Needs (10/12/2021)   PRAPARE - Hydrologist (Medical): No    Lack of Transportation (Non-Medical): No  Physical Activity: Not on file  Stress: No Stress Concern Present (10/12/2021)  [place] Questionnaire    Feeling of Stress : Only a little  Social Connections: Socially Integrated (10/12/2021)   Social Connection and Isolation Panel [NHANES]    Frequency of Communication with Friends and Family: More than three times a week    Frequency of Social Gatherings with Friends and Family: More than three times a week    Attends Religious Services: More than 4 times per year    Active Member of [employer] or Organizations: Yes    Attends Archivist Meetings: 1 to 4 times per year    Marital Status: Married     Review of Systems  Constitutional:  Negative for appetite change and unexpected weight change.  HENT:  Negative for congestion and sinus pressure.        No increased drainage.   Respiratory:  Negative for chest tightness and shortness of breath.         No increased cough.   Cardiovascular:  Negative for chest pain, palpitations and leg swelling.  Gastrointestinal:  Negative for abdominal pain, diarrhea, nausea and vomiting.  Genitourinary:  Negative for difficulty urinating and dysuria.  Musculoskeletal:  Negative for joint swelling and myalgias.  Skin:  Negative for color change and rash.  Neurological:  Negative for dizziness and headaches.  Psychiatric/Behavioral:  Negative for agitation and dysphoric mood.        Increased stress.        Objective:     BP 138/88   Pulse 68   Temp 98.2 F (36.8 C) (Temporal)   Resp 18   Ht '5\' 3"'$  (1.6 m)   Wt 152 lb 3.2 oz (69 kg)   SpO2 98%   BMI 26.96 kg/m  Wt Readings from Last 3 Encounters:  08/02/22 152 lb 3.2 oz (69 kg)  05/24/22 146 lb 6.4 oz (66.4 kg)  04/26/22 146 lb 9.6 oz (66.5 kg)    Physical Exam Vitals reviewed.  Constitutional:      General: She is not in acute distress.    Appearance: Normal appearance.  HENT:     Head: Normocephalic and atraumatic.     Right Ear: External ear normal.     Left Ear: External ear normal.  Eyes:     General: No scleral icterus.       Right eye: No discharge.        Left eye: No discharge.     Conjunctiva/sclera: Conjunctivae normal.  Neck:     Thyroid: No thyromegaly.  Cardiovascular:     Rate and Rhythm: Normal rate and regular rhythm.  Pulmonary:     Effort: No respiratory distress.     Breath sounds: Normal breath sounds. No wheezing.  Abdominal:     General: Bowel sounds are normal.     Palpations: Abdomen is soft.     Tenderness: There is no abdominal tenderness.  Musculoskeletal:        General: No swelling or tenderness.     Cervical back: Neck supple. No tenderness.  Lymphadenopathy:     Cervical: No cervical adenopathy.  Skin:    Findings: No erythema or rash.  Neurological:     Mental Status: She is alert.  Psychiatric:        Mood and Affect: Mood normal.        Behavior: Behavior normal.       Outpatient Encounter Medications as of 08/02/2022  Medication Sig   albuterol (VENTOLIN HFA) 108 (90 Base) MCG/ACT inhaler  Inhale 2 puffs into the lungs every 6 (six) hours as needed for wheezing or shortness of breath.   azelastine (ASTELIN) 0.1 % nasal spray Place 1 spray into both nostrils 2 (two) times daily. Use in each nostril as directed   Ibuprofen 200 MG CAPS Take by mouth.   [DISCONTINUED] atorvastatin (LIPITOR) 20 MG tablet Take 1 tablet (20 mg total) by mouth daily.   amLODipine (NORVASC) 2.5 MG tablet Take 1 tablet (2.5 mg total) by mouth daily.   atorvastatin (LIPITOR) 20 MG tablet Take 1 tablet (20 mg total) by mouth daily.   [DISCONTINUED] amLODipine (NORVASC) 2.5 MG tablet Take 1 tablet (2.5 mg total) by mouth daily. (Patient not taking: Reported on 08/02/2022)   [DISCONTINUED] doxycycline (VIBRA-TABS) 100 MG tablet Take 1 tablet (100 mg total) by mouth 2 (two) times daily.   [DISCONTINUED] predniSONE (DELTASONE) 10 MG tablet Take 6 tablets x 1 day and then decrease by 1/2 tablet per day until down to zero mg.   No facility-administered encounter medications on file as of 08/02/2022.     Lab Results  Component Value Date   WBC 9.8 06/23/2022   HGB 13.5 06/23/2022   HCT 39.7 06/23/2022   PLT 455.0 (H) 06/23/2022   GLUCOSE 87 05/31/2022   CHOL 259 (H) 05/31/2022   TRIG 140.0 05/31/2022   HDL 116.70 05/31/2022   LDLDIRECT 80.0 08/11/2021   LDLCALC 114 (H) 05/31/2022   ALT 13 05/31/2022   AST 13 05/31/2022   NA 139 05/31/2022   K 4.1 05/31/2022   CL 103 05/31/2022   CREATININE 0.76 05/31/2022   BUN 25 (H) 05/31/2022   CO2 29 05/31/2022   TSH 2.57 08/11/2021   MICROALBUR 0.7 01/24/2017    Mammogram 3D SCREEN BREAST BILATERAL  Result Date: 01/11/2022 CLINICAL DATA:  Screening. EXAM: DIGITAL SCREENING BILATERAL MAMMOGRAM WITH TOMOSYNTHESIS AND CAD TECHNIQUE: Bilateral screening digital craniocaudal and mediolateral oblique mammograms were obtained. Bilateral  screening digital breast tomosynthesis was performed. The images were evaluated with computer-aided detection. COMPARISON:  Previous exam(s). ACR Breast Density Category b: There are scattered areas of fibroglandular density. FINDINGS: There are no findings suspicious for malignancy. IMPRESSION: No mammographic evidence of malignancy. A result letter of this screening mammogram will be mailed directly to the patient. RECOMMENDATION: Screening mammogram in one year. (Code:SM-B-01Y) BI-RADS CATEGORY  1: Negative. Electronically Signed   By: Marin Olp M.D.   On: 01/11/2022 16:40   DEXAScan  Result Date: 01/11/2022 EXAM: DUAL X-RAY ABSORPTIOMETRY (DXA) FOR BONE MINERAL DENSITY IMPRESSION: Dear Dr. Nicki Reaper, Your patient JODE LIPPE completed a FRAX assessment on 01/11/2022 using the [employer] (analysis version: 14.10) manufactured by [employer]. The following summarizes the results of our evaluation. PATIENT BIOGRAPHICAL: Name: Jenifer, Struve Patient ID: [id number] Birth Date: 1945/08/04 Height:    62.0 in. [age]:     [age]    [age]       Weight:    145.1 lbs. Ethnicity:  White                            Exam Date: 01/11/2022 FRAX* RESULTS:  (version: 3.5) 10-year Probability of Fracture1 Major Osteoporotic Fracture2 Hip Fracture 19.2% 9.0% Population: [place] (Caucasian) Risk Factors: Tobacco User (Current Smoker) Based on Femur (Left) Neck BMD 1 -The 10-year probability of fracture may be lower than reported if the patient has received treatment. 2 -Major Osteoporotic  Fracture: Clinical Spine, Forearm, Hip or Shoulder *FRAX is a Materials engineer of the [employer] for Metabolic Bone Disease, a [place] (WHO) [facility]. ASSESSMENT: The probability of a major osteoporotic fracture is 19.2% within the next ten years. The probability of a hip fracture is 9.0% within the next ten years. . Your patient Skyann Ganim completed a  BMD test on 01/11/2022 using the [place] (software version: 14.10) manufactured by [employer]. The following summarizes the results of our evaluation. Technologist: [facility] PATIENT BIOGRAPHICAL: Name: Murle, Hellstrom Patient ID: [id number] Birth Date: [birth year] Height: 62.0 in. Gender: Female Exam Date: 01/11/2022 Weight: 145.1 lbs. Indications: Advanced Age, Caucasian, Hysterectomy, Oophorectomy Bilateral, Postmenopausal, Tobacco User (Current Smoker) Fractures: Treatments: Vitamin D DENSITOMETRY RESULTS: Site         Region     Measured Date Measured Age WHO Classification Young Adult T-score BMD         %Change vs. Previous Significant Change (*) DualFemur Neck Left 01/11/2020 77.9 Osteopenia -2.4 0.709 g/cm2 -6.5% - DualFemur Neck Left 10/25/2017 73.7 Osteopenia -2.0 0.758 g/cm2 - - DualFemur Total Mean 01/11/2022 77.9 Osteopenia -1.1 0.870 g/cm2 -2.1% Yes DualFemur Total Mean 10/25/2017 73.7 Normal -0.9 0.889 g/cm2 - - Left Forearm Radius 33% 01/11/2022 77.9 Normal -0.7 0.812 g/cm2 1.4% - Left Forearm Radius 33% 10/25/2017 73.7 Normal -0.9 0.801 g/cm2 - - ASSESSMENT: The BMD measured at Femur Neck Left is 0.709 g/cm2 with a T-score of -2.4. This patient is considered osteopenic according to [facility]) criteria. The scan quality is good. Lumbar spine was not utilized due to advanced degenerative changes. Compared with prior study, there has been significant decrease in the total hip. World Pharmacologist [facility]) criteria for post-menopausal, Caucasian Women: Normal:                   T-score at or above -1 SD Osteopenia/low bone mass: T-score between -1 and -2.5 SD Osteoporosis:             T-score at or below -2.5 SD RECOMMENDATIONS: 1. All patients should optimize calcium and vitamin D intake. 2. Consider FDA-approved medical therapies in postmenopausal women and men aged 20 years and older, based on the following: a. A hip or vertebral(clinical or  morphometric) fracture b. T-score < -2.5 at the femoral neck or spine after appropriate evaluation to exclude secondary causes c. Low bone mass (T-score between -1.0 and -2.5 at the femoral neck or spine) and a 10-year probability of a hip fracture > 3% or a 10-year probability of a major osteoporosis-related fracture > 20% based on the [place]-adapted WHO algorithm 3. Clinician judgment and/or patient preferences may indicate treatment for people with 10-year fracture probabilities above or below these levels FOLLOW-UP: People with diagnosed cases of osteoporosis or at high risk for fracture should have regular bone mineral density tests. For patients eligible for Medicare, routine testing is allowed once every 2 years. The testing frequency can be increased to one year for patients who have rapidly progressing disease, those who are receiving or discontinuing medical therapy to restore bone mass, or have additional risk factors. I have reviewed this report, and agree with the above findings. [facility] Electronically Signed   By: Dorise Bullion III M.D.   On: 01/11/2022 08:58       Assessment & Plan:   Problem List Items Addressed This Visit     Cough    No increased  cough.  Feels back to normal.  Follow.       Hypercholesterolemia    Continue lipitor.  Low cholesterol diet and exercise.  Follow lipid panel and liver function tests.        Relevant Medications   amLODipine (NORVASC) 2.5 MG tablet   atorvastatin (LIPITOR) 20 MG tablet   Other Relevant Orders   Lipid panel   TSH   Hepatic function panel   Hypertension - Primary    Previously started on amlodipine.  Was tolerating.  Recently stopped due to some mild swelling in ankles and increased ringing in her ears.  Has had ringing in her ears for years, but felt was worse.  Noticed the minimal swelling and increased ringing while on prednisone and being treated for infection.  She stopped amlodipine.  Also off prednisone.  No  swelling now.  Ringing - baseline.  Agreeable to restart amlodipine 2.'5mg'$  q day.  Follow pressures.  Follow metabolic panel.          Relevant Medications   amLODipine (NORVASC) 2.5 MG tablet   atorvastatin (LIPITOR) 20 MG tablet   Other Relevant Orders   Basic metabolic panel   Stress    Overall appears to be handling things well.  Follow.       Thrombocytosis ([place])    Worked up by hematology.  Follow cbc.       Relevant Orders   CBC with Differential/Platelet   Tobacco abuse    Have discussed the need to quit smoking.  Have also discussed screening CT chest.  Has declined. Had reported will notify me if changes her mind.         Einar Pheasant, MD

## 2022-08-03 ENCOUNTER — Encounter: Payer: Self-pay | Admitting: Internal Medicine

## 2022-08-03 NOTE — Assessment & Plan Note (Signed)
Previously started on amlodipine.  Was tolerating.  Recently stopped due to some mild swelling in ankles and increased ringing in her ears.  Has had ringing in her ears for years, but felt was worse.  Noticed the minimal swelling and increased ringing while on prednisone and being treated for infection.  She stopped amlodipine.  Also off prednisone.  No swelling now.  Ringing - baseline.  Agreeable to restart amlodipine 2.'5mg'$  q day.  Follow pressures.  Follow metabolic panel.

## 2022-08-03 NOTE — Assessment & Plan Note (Signed)
No increased cough.  Feels back to normal.  Follow.

## 2022-08-03 NOTE — Assessment & Plan Note (Signed)
Worked up by hematology.  Follow cbc.

## 2022-08-03 NOTE — Assessment & Plan Note (Signed)
Have discussed the need to quit smoking.  Have also discussed screening CT chest.  Has declined. Had reported will notify me if changes her mind.

## 2022-08-03 NOTE — Assessment & Plan Note (Signed)
Continue lipitor.  Low cholesterol diet and exercise.  Follow lipid panel and liver function tests.

## 2022-08-03 NOTE — Assessment & Plan Note (Signed)
Overall appears to be handling things well.  Follow.  ?

## 2022-10-01 ENCOUNTER — Other Ambulatory Visit (INDEPENDENT_AMBULATORY_CARE_PROVIDER_SITE_OTHER): Payer: PPO

## 2022-10-01 DIAGNOSIS — D75839 Thrombocytosis, unspecified: Secondary | ICD-10-CM | POA: Diagnosis not present

## 2022-10-01 DIAGNOSIS — E7800 Pure hypercholesterolemia, unspecified: Secondary | ICD-10-CM

## 2022-10-01 DIAGNOSIS — I10 Essential (primary) hypertension: Secondary | ICD-10-CM

## 2022-10-01 LAB — LIPID PANEL
Cholesterol: 187 mg/dL (ref 0–200)
HDL: 86.9 mg/dL (ref >39.00)
LDL Cholesterol: 69 mg/dL (ref 0–99)
NonHDL: 99.91
Total CHOL/HDL Ratio: 2
Triglycerides: 155 mg/dL — ABNORMAL HIGH (ref 0.0–149.0)
VLDL: 31 mg/dL (ref 0.0–40.0)

## 2022-10-01 LAB — HEPATIC FUNCTION PANEL
ALT: 16 U/L (ref 0–35)
AST: 19 U/L (ref 0–37)
Albumin: 4.4 g/dL (ref 3.5–5.2)
Alkaline Phosphatase: 93 U/L (ref 39–117)
Bilirubin, Direct: 0.1 mg/dL (ref 0.0–0.3)
Total Bilirubin: 0.6 mg/dL (ref 0.2–1.2)
Total Protein: 7 g/dL (ref 6.0–8.3)

## 2022-10-01 LAB — CBC WITH DIFFERENTIAL/PLATELET
Basophils Absolute: 0 10*3/uL (ref 0.0–0.1)
Basophils Relative: 0.6 % (ref 0.0–3.0)
Eosinophils Absolute: 0.3 10*3/uL (ref 0.0–0.7)
Eosinophils Relative: 3.9 % (ref 0.0–5.0)
HCT: 42.5 % (ref 36.0–46.0)
Hemoglobin: 14.4 g/dL (ref 12.0–15.0)
Lymphocytes Relative: 30.2 % (ref 12.0–46.0)
Lymphs Abs: 2.6 10*3/uL (ref 0.7–4.0)
MCHC: 33.8 g/dL (ref 30.0–36.0)
MCV: 95.5 fl (ref 78.0–100.0)
Monocytes Absolute: 0.7 10*3/uL (ref 0.1–1.0)
Monocytes Relative: 8.5 % (ref 3.0–12.0)
Neutro Abs: 4.9 10*3/uL (ref 1.4–7.7)
Neutrophils Relative %: 56.8 % (ref 43.0–77.0)
Platelets: 394 10*3/uL (ref 150.0–400.0)
RBC: 4.46 Mil/uL (ref 3.87–5.11)
RDW: 13.3 % (ref 11.5–15.5)
WBC: 8.7 10*3/uL (ref 4.0–10.5)

## 2022-10-01 LAB — BASIC METABOLIC PANEL
BUN: 18 mg/dL (ref 6–23)
CO2: 28 mEq/L (ref 19–32)
Calcium: 9.5 mg/dL (ref 8.4–10.5)
Chloride: 104 mEq/L (ref 96–112)
Creatinine, Ser: 0.63 mg/dL (ref 0.40–1.20)
GFR: 84.84 mL/min (ref >60.00)
Glucose, Bld: 96 mg/dL (ref 70–99)
Potassium: 4.5 mEq/L (ref 3.5–5.1)
Sodium: 140 mEq/L (ref 135–145)

## 2022-10-01 LAB — TSH: TSH: 2.86 u[IU]/mL (ref 0.35–5.50)

## 2022-10-05 ENCOUNTER — Encounter: Payer: Self-pay | Admitting: Internal Medicine

## 2022-10-05 ENCOUNTER — Ambulatory Visit (INDEPENDENT_AMBULATORY_CARE_PROVIDER_SITE_OTHER): Payer: PPO | Admitting: Internal Medicine

## 2022-10-05 VITALS — BP 136/86 | HR 87 | Temp 98.2°F | Resp 16 | Ht 64.0 in | Wt 147.0 lb

## 2022-10-05 DIAGNOSIS — Z0000 Encounter for general adult medical examination without abnormal findings: Secondary | ICD-10-CM

## 2022-10-05 DIAGNOSIS — R0981 Nasal congestion: Secondary | ICD-10-CM

## 2022-10-05 DIAGNOSIS — I10 Essential (primary) hypertension: Secondary | ICD-10-CM | POA: Diagnosis not present

## 2022-10-05 DIAGNOSIS — Z720 Tobacco use: Secondary | ICD-10-CM | POA: Diagnosis not present

## 2022-10-05 DIAGNOSIS — E7800 Pure hypercholesterolemia, unspecified: Secondary | ICD-10-CM | POA: Diagnosis not present

## 2022-10-05 DIAGNOSIS — M79651 Pain in right thigh: Secondary | ICD-10-CM

## 2022-10-05 DIAGNOSIS — J42 Unspecified chronic bronchitis: Secondary | ICD-10-CM

## 2022-10-05 DIAGNOSIS — D75839 Thrombocytosis, unspecified: Secondary | ICD-10-CM

## 2022-10-05 DIAGNOSIS — Z1231 Encounter for screening mammogram for malignant neoplasm of breast: Secondary | ICD-10-CM | POA: Diagnosis not present

## 2022-10-05 DIAGNOSIS — D473 Essential (hemorrhagic) thrombocythemia: Secondary | ICD-10-CM

## 2022-10-05 DIAGNOSIS — F439 Reaction to severe stress, unspecified: Secondary | ICD-10-CM | POA: Diagnosis not present

## 2022-10-05 DIAGNOSIS — R030 Elevated blood-pressure reading, without diagnosis of hypertension: Secondary | ICD-10-CM | POA: Diagnosis not present

## 2022-10-05 MED ORDER — AMLODIPINE BESYLATE 2.5 MG PO TABS: 2.5000 mg | ORAL_TABLET | Freq: Every day | ORAL | 1 refills | Status: DC

## 2022-10-05 MED ORDER — AZELASTINE HCL 0.1 % NA SOLN: 1.0000 | Freq: Two times a day (BID) | NASAL | 1 refills | Status: DC

## 2022-10-05 NOTE — Assessment & Plan Note (Signed)
Physical today 10/05/22  Mammogram 01/11/22 - Birads I.  Colnoscopy 12/2012 - Diverticulosis.

## 2022-10-05 NOTE — Progress Notes (Signed)
[name], female    DOB: 1945-11-25, 77 y.o.   MRN: [id number]  Patient here for  Chief Complaint  Patient presents with   Annual Exam    HPI Here for physical exam.  previously evaluated for persistent cough and congestion. Resolved.  Some allergy symptoms now.  Discussed nasal spray and antihistamine.  Will let me know if progresses. Amlodipine previously stopped due to minimal ankle swelling and ringing in her ears, but was also sick and on prednisone when had these symptoms.  Restarted last visit.  Tolerating.  States blood pressures at home averaging 117-120s/60-70.  No chest pain.  Breathing stable. No abdominal pain or bowel change.  Does report two weeks ago - increased walking at [employer].  Noticed after - increased pain - top of right thigh.  Continues to bother her.  Increased discomfort if sitting for a while and then goes to stand.  Some discomfort with walking.  Notices more when crossing legs. No known injury or trauma.  Continues to smoke.  Discussed need to quit.  Discussed lung cancer screening.  Declines.   Past Medical History:  Diagnosis Date   Chronic bronchitis ([place])    Hypercholesterolemia    Migraines    Past Surgical History:  Procedure Laterality Date   ABDOMINAL HYSTERECTOMY  1979   secondary to fibroids, incidental appendectomy   BREAST EXCISIONAL BIOPSY Right 1979   benign   TONSILLECTOMY  1960   Family History  Problem Relation Age of Onset   Heart disease Father        myocardial infarction - died age 56   Heart disease Mother    Diabetes Mother    Diabetes Maternal Grandmother    Breast cancer Neg Hx    Colon cancer Neg Hx    Social History   Socioeconomic History   Marital status: Married    Spouse name: Not on file   Number of children: 1   Years of education: Not on file   Highest education level: Not on file  Occupational History   Not on file  Tobacco Use   Smoking status: Every Day     Packs/day: 1.00    Years: 50.00    Additional pack years: 0.00    Total pack years: 50.00    Types: Cigarettes   Smokeless tobacco: Never   Tobacco comments:    is going to try electronic cigarettes  Vaping Use   Vaping Use: Never used  Substance and Sexual Activity   Alcohol use: No    Alcohol/week: 0.0 standard drinks of alcohol   Drug use: No   Sexual activity: Not Currently  Other Topics Concern   Not on file  Social History Narrative   Not on file   Social Determinants of Health   Financial Resource Strain: Low Risk  (10/12/2021)   Overall Financial Resource Strain (CARDIA)    Difficulty of Paying Living Expenses: Not hard at all  Food Insecurity: No Food Insecurity (10/12/2021)   Hunger Vital Sign    Worried About Running Out of Food in the Last Year: Never true    Ran Out of Food in the Last Year: Never true  Transportation Needs: No Transportation Needs (10/12/2021)   PRAPARE - Hydrologist (Medical): No    Lack of Transportation (Non-Medical): No  Physical Activity: Not on file  Stress: No Stress Concern Present (10/12/2021)   [employer] Occupational  Health - Occupational Stress Questionnaire    Feeling of Stress : Only a little  Social Connections: Socially Integrated (10/12/2021)   Social Connection and Isolation Panel [NHANES]    Frequency of Communication with Friends and Family: More than three times a week    Frequency of Social Gatherings with Friends and Family: More than three times a week    Attends Religious Services: More than 4 times per year    Active Member of [employer] or Organizations: Yes    Attends Archivist Meetings: 1 to 4 times per year    Marital Status: Married     Review of Systems  Constitutional:  Negative for appetite change and unexpected weight change.  HENT:  Negative for sinus pressure and sore throat.        Minimal allergy symptoms.   Eyes:  Negative for pain and visual disturbance.   Respiratory:  Negative for cough, chest tightness and shortness of breath.   Cardiovascular:  Negative for chest pain and palpitations.  Gastrointestinal:  Negative for abdominal pain, diarrhea, nausea and vomiting.  Genitourinary:  Negative for difficulty urinating and dysuria.  Musculoskeletal:  Negative for joint swelling and myalgias.  Skin:  Negative for color change and rash.  Neurological:  Negative for dizziness and headaches.  Hematological:  Negative for adenopathy. Does not bruise/bleed easily.  Psychiatric/Behavioral:  Negative for agitation and dysphoric mood.        Objective:     BP 136/86   Pulse 87   Temp 98.2 F (36.8 C)   Resp 16   Ht 5\' 4"  (1.626 m)   Wt 147 lb (66.7 kg)   SpO2 98%   BMI 25.23 kg/m  Wt Readings from Last 3 Encounters:  10/05/22 147 lb (66.7 kg)  08/02/22 152 lb 3.2 oz (69 kg)  05/24/22 146 lb 6.4 oz (66.4 kg)    Physical Exam Vitals reviewed.  Constitutional:      General: She is not in acute distress.    Appearance: Normal appearance. She is well-developed.  HENT:     Head: Normocephalic and atraumatic.     Right Ear: External ear normal.     Left Ear: External ear normal.  Eyes:     General: No scleral icterus.       Right eye: No discharge.        Left eye: No discharge.     Conjunctiva/sclera: Conjunctivae normal.  Neck:     Thyroid: No thyromegaly.  Cardiovascular:     Rate and Rhythm: Normal rate and regular rhythm.  Pulmonary:     Effort: No tachypnea, accessory muscle usage or respiratory distress.     Breath sounds: Normal breath sounds. No decreased breath sounds or wheezing.  Chest:  Breasts:    Right: No inverted nipple, mass, nipple discharge or tenderness (no axillary adenopathy).     Left: No inverted nipple, mass, nipple discharge or tenderness (no axilarry adenopathy).  Abdominal:     General: Bowel sounds are normal.     Palpations: Abdomen is soft.     Tenderness: There is no abdominal tenderness.   Musculoskeletal:        General: No swelling or tenderness.     Cervical back: Neck supple. No tenderness.  Lymphadenopathy:     Cervical: No cervical adenopathy.  Skin:    Findings: No erythema or rash.  Neurological:     Mental Status: She is alert and oriented to person, place, and time.  Psychiatric:  Mood and Affect: Mood normal.        Behavior: Behavior normal.      Outpatient Encounter Medications as of 10/05/2022  Medication Sig   albuterol (VENTOLIN HFA) 108 (90 Base) MCG/ACT inhaler Inhale 2 puffs into the lungs every 6 (six) hours as needed for wheezing or shortness of breath.   amLODipine (NORVASC) 2.5 MG tablet Take 1 tablet (2.5 mg total) by mouth daily.   atorvastatin (LIPITOR) 20 MG tablet Take 1 tablet (20 mg total) by mouth daily.   azelastine (ASTELIN) 0.1 % nasal spray Place 1 spray into both nostrils 2 (two) times daily. Use in each nostril as directed   Ibuprofen 200 MG CAPS Take by mouth.   [DISCONTINUED] amLODipine (NORVASC) 2.5 MG tablet Take 1 tablet (2.5 mg total) by mouth daily.   [DISCONTINUED] azelastine (ASTELIN) 0.1 % nasal spray Place 1 spray into both nostrils 2 (two) times daily. Use in each nostril as directed   No facility-administered encounter medications on file as of 10/05/2022.     Lab Results  Component Value Date   WBC 8.7 10/01/2022   HGB 14.4 10/01/2022   HCT 42.5 10/01/2022   PLT 394.0 10/01/2022   GLUCOSE 96 10/01/2022   CHOL 187 10/01/2022   TRIG 155.0 (H) 10/01/2022   HDL 86.90 10/01/2022   LDLDIRECT 80.0 08/11/2021   LDLCALC 69 10/01/2022   ALT 16 10/01/2022   AST 19 10/01/2022   NA 140 10/01/2022   K 4.5 10/01/2022   CL 104 10/01/2022   CREATININE 0.63 10/01/2022   BUN 18 10/01/2022   CO2 28 10/01/2022   TSH 2.86 10/01/2022   MICROALBUR 0.7 01/24/2017    Mammogram 3D SCREEN BREAST BILATERAL  Result Date: 01/11/2022 CLINICAL DATA:  Screening. EXAM: DIGITAL SCREENING BILATERAL MAMMOGRAM WITH TOMOSYNTHESIS  AND CAD TECHNIQUE: Bilateral screening digital craniocaudal and mediolateral oblique mammograms were obtained. Bilateral screening digital breast tomosynthesis was performed. The images were evaluated with computer-aided detection. COMPARISON:  Previous exam(s). ACR Breast Density Category b: There are scattered areas of fibroglandular density. FINDINGS: There are no findings suspicious for malignancy. IMPRESSION: No mammographic evidence of malignancy. A result letter of this screening mammogram will be mailed directly to the patient. RECOMMENDATION: Screening mammogram in one year. (Code:SM-B-01Y) BI-RADS CATEGORY  1: Negative. Electronically Signed   By: Marin Olp M.D.   On: 01/11/2022 16:40   DEXAScan  Result Date: 01/11/2022 EXAM: DUAL X-RAY ABSORPTIOMETRY (DXA) FOR BONE MINERAL DENSITY IMPRESSION: Dear Dr. Nicki Reaper, Your patient SONRISA BERARDI completed a FRAX assessment on 01/11/2022 using the Rowan (analysis version: 14.10) manufactured by [employer]. The following summarizes the results of our evaluation. PATIENT BIOGRAPHICAL: Name: Imajean, Augustyn Patient ID: [id number] Birth Date: 1945-12-25 Height:    62.0 in. [age]:     [age]    [age]       Weight:    145.1 lbs. Ethnicity:  White                            Exam Date: 01/11/2022 FRAX* RESULTS:  (version: 3.5) 10-year Probability of Fracture1 Major Osteoporotic Fracture2 Hip Fracture 19.2% 9.0% Population: [place] (Caucasian) Risk Factors: Tobacco User (Current Smoker) Based on Femur (Left) Neck BMD 1 -The 10-year probability of fracture may be lower than reported if the patient has received treatment. 2 -Major Osteoporotic Fracture: Clinical Spine, Forearm, Hip or Shoulder *FRAX is  a Materials engineer of the [employer] for Metabolic Bone Disease, a [place] (WHO) [facility]. ASSESSMENT: The probability of a major osteoporotic fracture is 19.2% within the next  ten years. The probability of a hip fracture is 9.0% within the next ten years. . Your patient Amyri Lococo completed a BMD test on 01/11/2022 using the Malo (software version: 14.10) manufactured by [employer]. The following summarizes the results of our evaluation. Technologist: [facility] PATIENT BIOGRAPHICAL: Name: Summit, Stronach Patient ID: [id number] Birth Date: [birth year] Height: 62.0 in. Gender: Female Exam Date: 01/11/2022 Weight: 145.1 lbs. Indications: Advanced Age, Caucasian, Hysterectomy, Oophorectomy Bilateral, Postmenopausal, Tobacco User (Current Smoker) Fractures: Treatments: Vitamin D DENSITOMETRY RESULTS: Site         Region     Measured Date Measured Age WHO Classification Young Adult T-score BMD         %Change vs. Previous Significant Change (*) DualFemur Neck Left 01/11/2020 77.9 Osteopenia -2.4 0.709 g/cm2 -6.5% - DualFemur Neck Left 10/25/2017 73.7 Osteopenia -2.0 0.758 g/cm2 - - DualFemur Total Mean 01/11/2022 77.9 Osteopenia -1.1 0.870 g/cm2 -2.1% Yes DualFemur Total Mean 10/25/2017 73.7 Normal -0.9 0.889 g/cm2 - - Left Forearm Radius 33% 01/11/2022 77.9 Normal -0.7 0.812 g/cm2 1.4% - Left Forearm Radius 33% 10/25/2017 73.7 Normal -0.9 0.801 g/cm2 - - ASSESSMENT: The BMD measured at Femur Neck Left is 0.709 g/cm2 with a T-score of -2.4. This patient is considered osteopenic according to [facility]) criteria. The scan quality is good. Lumbar spine was not utilized due to advanced degenerative changes. Compared with prior study, there has been significant decrease in the total hip. World Pharmacologist [facility]) criteria for post-menopausal, Caucasian Women: Normal:                   T-score at or above -1 SD Osteopenia/low bone mass: T-score between -1 and -2.5 SD Osteoporosis:             T-score at or below -2.5 SD RECOMMENDATIONS: 1. All patients should optimize calcium and vitamin D intake. 2. Consider FDA-approved medical therapies in  postmenopausal women and men aged 27 years and older, based on the following: a. A hip or vertebral(clinical or morphometric) fracture b. T-score < -2.5 at the femoral neck or spine after appropriate evaluation to exclude secondary causes c. Low bone mass (T-score between -1.0 and -2.5 at the femoral neck or spine) and a 10-year probability of a hip fracture > 3% or a 10-year probability of a major osteoporosis-related fracture > 20% based on the [place]-adapted WHO algorithm 3. Clinician judgment and/or patient preferences may indicate treatment for people with 10-year fracture probabilities above or below these levels FOLLOW-UP: People with diagnosed cases of osteoporosis or at high risk for fracture should have regular bone mineral density tests. For patients eligible for Medicare, routine testing is allowed once every 2 years. The testing frequency can be increased to one year for patients who have rapidly progressing disease, those who are receiving or discontinuing medical therapy to restore bone mass, or have additional risk factors. I have reviewed this report, and agree with the above findings. [facility] Electronically Signed   By: Dorise Bullion III M.D.   On: 01/11/2022 08:58       Assessment & Plan:  Routine general medical examination at a health care facility  Health care maintenance Assessment & Plan: Physical today 10/05/22  Mammogram 01/11/22 - Birads I.  Colnoscopy 12/2012 - Diverticulosis.    Hypercholesterolemia Assessment & Plan: Continue lipitor.  Low cholesterol diet and exercise.  Follow lipid panel and liver function tests.    Orders: -     Lipid panel; Future -     Hepatic function panel; Future -     Basic metabolic panel; Future  Visit for screening mammogram -     3D Screening Mammogram, Left and Right; Future  Elevated blood pressure reading Assessment & Plan: Elevation here in office.  States blood pressure on outside checks 117-120s/60-70 as  outlined.   Discussed medication.  She wants to monitor blood pressure and hold on medication.  Spot check pressure.  Send in readings.  If persistent elevation, will adjust medication.    Hypertension, unspecified type Assessment & Plan: On amlodipine 2.5mg  q day.  Follow pressures.  Follow metabolic panel.       Nasal congestion Assessment & Plan: Discussed nasal spray and antihistamine.  Follow.    Stress Assessment & Plan: Overall appears to be handling things well.  Follow.    Thrombocytosis ([place]) Assessment & Plan: Worked up by hematology.  Follow cbc.    Tobacco abuse Assessment & Plan: Discussed the need to quit smoking.  Have discussed screening CT chest.  She declined. Will notify me if changes her mind.     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  Has been taking advil. Discussed tylenol.  Discussed stretches.  Offered PT.  Declines further evaluation at this time.  Will notify me if changes her mind or if pain persists.     Other orders -     amLODIPine Besylate; Take 1 tablet (2.5 mg total) by mouth daily.  Dispense: 90 tablet; Refill: 1 -     Azelastine HCl; Place 1 spray into both nostrils 2 (two) times daily. Use in each nostril as directed  Dispense: 30 mL; Refill: 1     Einar Pheasant, MD

## 2022-10-08 ENCOUNTER — Telehealth: Payer: Self-pay | Admitting: Internal Medicine

## 2022-10-08 NOTE — Telephone Encounter (Signed)
Contacted Monica Morales to schedule their annual wellness visit. Appointment made for 11/08/2022.  Thank you,  [place] Direct dial  [phone]

## 2022-10-10 ENCOUNTER — Encounter: Payer: Self-pay | Admitting: Internal Medicine

## 2022-10-10 DIAGNOSIS — M79659 Pain in unspecified thigh: Secondary | ICD-10-CM | POA: Insufficient documentation

## 2022-10-10 DIAGNOSIS — J42 Unspecified chronic bronchitis: Secondary | ICD-10-CM | POA: Insufficient documentation

## 2022-10-10 NOTE — Assessment & Plan Note (Addendum)
Elevation here in office.  States blood pressure on outside checks 117-120s/60-70 as outlined.   Discussed medication.  She wants to monitor blood pressure and hold on medication.  Spot check pressure.  Send in readings.  If persistent elevation, will adjust medication.

## 2022-10-10 NOTE — Assessment & Plan Note (Signed)
Overall appears to be handling things well.  Follow.  ?

## 2022-10-10 NOTE — Assessment & Plan Note (Signed)
Continue lipitor.  Low cholesterol diet and exercise.  Follow lipid panel and liver function tests.   

## 2022-10-10 NOTE — Assessment & Plan Note (Signed)
Worked up by hematology.  Follow cbc.  

## 2022-10-10 NOTE — Assessment & Plan Note (Signed)
On amlodipine 2.5mg  q day.  Follow pressures.  Follow metabolic panel.

## 2022-10-10 NOTE — Assessment & Plan Note (Signed)
Discussed the need to quit smoking.  Have discussed screening CT chest.  She declined. Will notify me if changes her mind.

## 2022-10-10 NOTE — Assessment & Plan Note (Signed)
Discussed nasal spray and antihistamine.  Follow.

## 2022-10-10 NOTE — Assessment & Plan Note (Signed)
Noticed two weeks ago after [place] [employer] - increased right thigh pain.  Has been taking advil. Discussed tylenol.  Discussed stretches.  Offered PT.  Declines further evaluation at this time.  Will notify me if changes her mind or if pain persists.

## 2023-01-13 ENCOUNTER — Ambulatory Visit
Admission: RE | Admit: 2023-01-13 | Discharge: 2023-01-13 | Disposition: A | Discharge status: Home / Self Care | Payer: PPO | Source: Ambulatory Visit | Attending: Internal Medicine | Admitting: Internal Medicine

## 2023-01-13 DIAGNOSIS — Z1231 Encounter for screening mammogram for malignant neoplasm of breast: Secondary | ICD-10-CM | POA: Insufficient documentation

## 2023-01-24 DIAGNOSIS — H2513 Age-related nuclear cataract, bilateral: Secondary | ICD-10-CM | POA: Diagnosis not present

## 2023-02-03 ENCOUNTER — Other Ambulatory Visit: Payer: PPO

## 2023-02-08 ENCOUNTER — Ambulatory Visit: Payer: PPO | Admitting: Internal Medicine

## 2023-04-18 ENCOUNTER — Ambulatory Visit: Payer: PPO | Admitting: Internal Medicine

## 2023-05-11 ENCOUNTER — Ambulatory Visit: Payer: PPO | Admitting: Internal Medicine

## 2023-05-11 ENCOUNTER — Encounter: Payer: Self-pay | Admitting: Internal Medicine

## 2023-05-11 VITALS — BP 128/68 | HR 76 | Temp 97.9°F | Ht 64.0 in | Wt 149.4 lb

## 2023-05-11 DIAGNOSIS — I10 Essential (primary) hypertension: Secondary | ICD-10-CM

## 2023-05-11 DIAGNOSIS — Z720 Tobacco use: Secondary | ICD-10-CM

## 2023-05-11 DIAGNOSIS — D75839 Thrombocytosis, unspecified: Secondary | ICD-10-CM

## 2023-05-11 DIAGNOSIS — E7800 Pure hypercholesterolemia, unspecified: Secondary | ICD-10-CM

## 2023-05-11 DIAGNOSIS — F439 Reaction to severe stress, unspecified: Secondary | ICD-10-CM | POA: Diagnosis not present

## 2023-05-11 LAB — LIPID PANEL
Cholesterol: 176 mg/dL (ref 0–200)
HDL: 81.2 mg/dL (ref >39.00)
LDL Cholesterol: 60 mg/dL (ref 0–99)
NonHDL: 95.23
Total CHOL/HDL Ratio: 2
Triglycerides: 178 mg/dL — ABNORMAL HIGH (ref 0.0–149.0)
VLDL: 35.6 mg/dL (ref 0.0–40.0)

## 2023-05-11 LAB — BASIC METABOLIC PANEL
BUN: 16 mg/dL (ref 6–23)
CO2: 27 meq/L (ref 19–32)
Calcium: 9.9 mg/dL (ref 8.4–10.5)
Chloride: 104 meq/L (ref 96–112)
Creatinine, Ser: 0.63 mg/dL (ref 0.40–1.20)
GFR: 84.48 mL/min (ref >60.00)
Glucose, Bld: 101 mg/dL — ABNORMAL HIGH (ref 70–99)
Potassium: 4 meq/L (ref 3.5–5.1)
Sodium: 140 meq/L (ref 135–145)

## 2023-05-11 LAB — CBC WITH DIFFERENTIAL/PLATELET
Basophils Absolute: 0.1 10*3/uL (ref 0.0–0.1)
Basophils Relative: 0.7 % (ref 0.0–3.0)
Eosinophils Absolute: 0.2 10*3/uL (ref 0.0–0.7)
Eosinophils Relative: 1.8 % (ref 0.0–5.0)
HCT: 40.8 % (ref 36.0–46.0)
Hemoglobin: 13.3 g/dL (ref 12.0–15.0)
Lymphocytes Relative: 27.3 % (ref 12.0–46.0)
Lymphs Abs: 2.8 10*3/uL (ref 0.7–4.0)
MCHC: 32.5 g/dL (ref 30.0–36.0)
MCV: 97.2 fL (ref 78.0–100.0)
Monocytes Absolute: 0.8 10*3/uL (ref 0.1–1.0)
Monocytes Relative: 7.3 % (ref 3.0–12.0)
Neutro Abs: 6.5 10*3/uL (ref 1.4–7.7)
Neutrophils Relative %: 62.9 % (ref 43.0–77.0)
Platelets: 401 10*3/uL — ABNORMAL HIGH (ref 150.0–400.0)
RBC: 4.2 Mil/uL (ref 3.87–5.11)
RDW: 13.5 % (ref 11.5–15.5)
WBC: 10.3 10*3/uL (ref 4.0–10.5)

## 2023-05-11 LAB — HEPATIC FUNCTION PANEL
ALT: 15 U/L (ref 0–35)
AST: 17 U/L (ref 0–37)
Albumin: 4.6 g/dL (ref 3.5–5.2)
Alkaline Phosphatase: 95 U/L (ref 39–117)
Bilirubin, Direct: 0.1 mg/dL (ref 0.0–0.3)
Total Bilirubin: 0.6 mg/dL (ref 0.2–1.2)
Total Protein: 6.9 g/dL (ref 6.0–8.3)

## 2023-05-11 MED ORDER — AMLODIPINE BESYLATE 2.5 MG PO TABS: 2.5000 mg | ORAL_TABLET | Freq: Every day | ORAL | 1 refills | Status: DC

## 2023-05-11 MED ORDER — ATORVASTATIN CALCIUM 20 MG PO TABS: 20.0000 mg | ORAL_TABLET | Freq: Every day | ORAL | 3 refills | Status: DC

## 2023-05-11 MED ORDER — AZELASTINE HCL 0.1 % NA SOLN: 1.0000 | Freq: Two times a day (BID) | NASAL | 1 refills | Status: AC

## 2023-05-11 NOTE — Progress Notes (Signed)
[name], female    DOB: [birth year], 77 y.o.   MRN: [id number]  Patient here for  Chief Complaint  Patient presents with   Medical Management of Chronic Issues    REFILLS    HPI Here to follow up regarding her blood pressure. Was recently started on amlodipine 2.5mg  q day.  Stays active.  No chest pain or sob reported.  No increased cough or congestion.  Recently had nosebleed.  Lasted 5 days.  Saw Dr Mayford Knife. He prescribed ivermectin for 30 days and a decongestant.  Reports nosebleed stopped.  No further bleeding.  No abdominal pain or bowel change reported.     Past Medical History:  Diagnosis Date   Chronic bronchitis (HCC)    Hypercholesterolemia    Migraines    Past Surgical History:  Procedure Laterality Date   ABDOMINAL HYSTERECTOMY  1979   secondary to fibroids, incidental appendectomy   BREAST EXCISIONAL BIOPSY Right 1979   benign   TONSILLECTOMY  1960   Family History  Problem Relation Age of Onset   Heart disease Father        myocardial infarction - died age 34   Heart disease Mother    Diabetes Mother    Diabetes Maternal Grandmother    Breast cancer Neg Hx    Colon cancer Neg Hx    Social History   Socioeconomic History   Marital status: Married    Spouse name: Not on file   Number of children: 1   Years of education: Not on file   Highest education level: Not on file  Occupational History   Not on file  Tobacco Use   Smoking status: Every Day    Current packs/day: 1.00    Average packs/day: 1 pack/day for 50.0 years (50.0 ttl pk-yrs)    Types: Cigarettes   Smokeless tobacco: Never   Tobacco comments:    is going to try electronic cigarettes  Vaping Use   Vaping status: Never Used  Substance and Sexual Activity   Alcohol use: No    Alcohol/week: 0.0 standard drinks of alcohol   Drug use: No   Sexual activity: Not Currently  Other Topics Concern   Not on file  Social History Narrative   Not on file    Social Determinants of Health   Financial Resource Strain: Low Risk  (10/12/2021)   Overall Financial Resource Strain (CARDIA)    Difficulty of Paying Living Expenses: Not hard at all  Food Insecurity: No Food Insecurity (10/12/2021)   Hunger Vital Sign    Worried About Running Out of Food in the Last Year: Never true    Ran Out of Food in the Last Year: Never true  Transportation Needs: No Transportation Needs (10/12/2021)   PRAPARE - Administrator, Civil Service (Medical): No    Lack of Transportation (Non-Medical): No  Physical Activity: Not on file  Stress: No Stress Concern Present (10/12/2021)   [employer] of Occupational Health - Occupational Stress Questionnaire    Feeling of Stress : Only a little  Social Connections: Socially Integrated (10/12/2021)   Social Connection and Isolation Panel [NHANES]    Frequency of Communication with Friends and Family: More than three times a week    Frequency of Social Gatherings with Friends and Family: More than three times a week    Attends Religious Services: More than 4 times per year    Active Member of Clubs or  Organizations: Yes    Attends Banker Meetings: 1 to 4 times per year    Marital Status: Married     Review of Systems  Constitutional:  Negative for appetite change and unexpected weight change.  HENT:  Negative for congestion and sinus pressure.   Respiratory:  Negative for cough, chest tightness and shortness of breath.   Cardiovascular:  Negative for chest pain and palpitations.  Gastrointestinal:  Negative for abdominal pain, diarrhea, nausea and vomiting.  Genitourinary:  Negative for difficulty urinating and dysuria.  Musculoskeletal:  Negative for joint swelling and myalgias.  Skin:  Negative for color change and rash.  Neurological:  Negative for dizziness and headaches.  Psychiatric/Behavioral:  Negative for agitation and dysphoric mood.        Objective:     BP 128/68    Pulse 76   Temp 97.9 F (36.6 C) (Oral)   Ht 5\' 4"  (1.626 m)   Wt 149 lb 6.4 oz (67.8 kg)   SpO2 97%   BMI 25.64 kg/m  Wt Readings from Last 3 Encounters:  05/11/23 149 lb 6.4 oz (67.8 kg)  10/05/22 147 lb (66.7 kg)  08/02/22 152 lb 3.2 oz (69 kg)    Physical Exam Vitals reviewed.  Constitutional:      General: She is not in acute distress.    Appearance: Normal appearance.  HENT:     Head: Normocephalic and atraumatic.     Right Ear: External ear normal.     Left Ear: External ear normal.  Eyes:     General: No scleral icterus.       Right eye: No discharge.        Left eye: No discharge.     Conjunctiva/sclera: Conjunctivae normal.  Neck:     Thyroid: No thyromegaly.  Cardiovascular:     Rate and Rhythm: Normal rate and regular rhythm.  Pulmonary:     Effort: No respiratory distress.     Breath sounds: Normal breath sounds. No wheezing.  Abdominal:     General: Bowel sounds are normal.     Palpations: Abdomen is soft.     Tenderness: There is no abdominal tenderness.  Musculoskeletal:        General: No swelling or tenderness.     Cervical back: Neck supple. No tenderness.  Lymphadenopathy:     Cervical: No cervical adenopathy.  Skin:    Findings: No erythema or rash.  Neurological:     Mental Status: She is alert.  Psychiatric:        Mood and Affect: Mood normal.        Behavior: Behavior normal.      Outpatient Encounter Medications as of 05/11/2023  Medication Sig   albuterol (VENTOLIN HFA) 108 (90 Base) MCG/ACT inhaler Inhale 2 puffs into the lungs every 6 (six) hours as needed for wheezing or shortness of breath.   amLODipine (NORVASC) 2.5 MG tablet Take 1 tablet (2.5 mg total) by mouth daily.   atorvastatin (LIPITOR) 20 MG tablet Take 1 tablet (20 mg total) by mouth daily.   azelastine (ASTELIN) 0.1 % nasal spray Place 1 spray into both nostrils 2 (two) times daily. Use in each nostril as directed   Ibuprofen 200 MG CAPS Take by mouth.    [DISCONTINUED] amLODipine (NORVASC) 2.5 MG tablet Take 1 tablet (2.5 mg total) by mouth daily.   [DISCONTINUED] atorvastatin (LIPITOR) 20 MG tablet Take 1 tablet (20 mg total) by mouth daily.   [DISCONTINUED] azelastine (ASTELIN)  0.1 % nasal spray Place 1 spray into both nostrils 2 (two) times daily. Use in each nostril as directed   No facility-administered encounter medications on file as of 05/11/2023.     Lab Results  Component Value Date   WBC 10.3 05/11/2023   HGB 13.3 05/11/2023   HCT 40.8 05/11/2023   PLT 401.0 (H) 05/11/2023   GLUCOSE 101 (H) 05/11/2023   CHOL 176 05/11/2023   TRIG 178.0 (H) 05/11/2023   HDL 81.20 05/11/2023   LDLDIRECT 80.0 08/11/2021   LDLCALC 60 05/11/2023   ALT 15 05/11/2023   AST 17 05/11/2023   NA 140 05/11/2023   K 4.0 05/11/2023   CL 104 05/11/2023   CREATININE 0.63 05/11/2023   BUN 16 05/11/2023   CO2 27 05/11/2023   TSH 2.86 10/01/2022   MICROALBUR 0.7 01/24/2017    MM 3D SCREENING MAMMOGRAM BILATERAL BREAST  Result Date: 01/17/2023 CLINICAL DATA:  Screening. EXAM: DIGITAL SCREENING BILATERAL MAMMOGRAM WITH TOMOSYNTHESIS AND CAD TECHNIQUE: Bilateral screening digital craniocaudal and mediolateral oblique mammograms were obtained. Bilateral screening digital breast tomosynthesis was performed. The images were evaluated with computer-aided detection. COMPARISON:  Previous exam(s). ACR Breast Density Category b: There are scattered areas of fibroglandular density. FINDINGS: There are no findings suspicious for malignancy. IMPRESSION: No mammographic evidence of malignancy. A result letter of this screening mammogram will be mailed directly to the patient. RECOMMENDATION: Screening mammogram in one year. (Code:SM-B-01Y) BI-RADS CATEGORY  1: Negative. Electronically Signed   By: Emmaline Kluver M.D.   On: 01/17/2023 12:28       Assessment & Plan:  Hypertension, unspecified type Assessment & Plan: Blood pressure elevated.  Discussed importance of  taking medication regularly. Continue amlodipine 2.5mg  q day and take daily. Follow pressures.  Follow metabolic panel.       Hypercholesterolemia Assessment & Plan: Continue lipitor.  Low cholesterol diet and exercise.  Follow lipid panel and liver function tests.    Orders: -     CBC with Differential/Platelet -     Basic metabolic panel -     Hepatic function panel -     Lipid panel  Stress Assessment & Plan: Overall appears to be handling things well.  Follow.    Thrombocytosis (HCC) Assessment & Plan: Worked up by hematology.  Follow cbc.    Tobacco abuse Assessment & Plan: Discussed again the need to quit smoking.  Discussed screening CT chest.  She declined. Will notify me if changes her mind.     Other orders -     amLODIPine Besylate; Take 1 tablet (2.5 mg total) by mouth daily.  Dispense: 90 tablet; Refill: 1 -     Atorvastatin Calcium; Take 1 tablet (20 mg total) by mouth daily.  Dispense: 90 tablet; Refill: 3 -     Azelastine HCl; Place 1 spray into both nostrils 2 (two) times daily. Use in each nostril as directed  Dispense: 30 mL; Refill: 1     Dale Vayas, MD

## 2023-05-15 ENCOUNTER — Encounter: Payer: Self-pay | Admitting: Internal Medicine

## 2023-05-15 NOTE — Assessment & Plan Note (Signed)
Continue lipitor.  Low cholesterol diet and exercise.  Follow lipid panel and liver function tests.   

## 2023-05-15 NOTE — Assessment & Plan Note (Signed)
Discussed again the need to quit smoking.  Discussed screening CT chest.  She declined. Will notify me if changes her mind.

## 2023-05-15 NOTE — Assessment & Plan Note (Signed)
Overall appears to be handling things well.  Follow.  ?

## 2023-05-15 NOTE — Assessment & Plan Note (Signed)
Worked up by hematology.  Follow cbc.  

## 2023-05-15 NOTE — Assessment & Plan Note (Signed)
Blood pressure elevated.  Discussed importance of taking medication regularly. Continue amlodipine 2.5mg  q day and take daily. Follow pressures.  Follow metabolic panel.

## 2023-08-12 ENCOUNTER — Ambulatory Visit (INDEPENDENT_AMBULATORY_CARE_PROVIDER_SITE_OTHER): Payer: PPO | Admitting: Internal Medicine

## 2023-08-12 VITALS — BP 130/74 | HR 78 | Temp 98.2°F | Resp 16 | Ht 63.0 in | Wt 147.6 lb

## 2023-08-12 DIAGNOSIS — Z720 Tobacco use: Secondary | ICD-10-CM

## 2023-08-12 DIAGNOSIS — T300 Burn of unspecified body region, unspecified degree: Secondary | ICD-10-CM | POA: Diagnosis not present

## 2023-08-12 DIAGNOSIS — J42 Unspecified chronic bronchitis: Secondary | ICD-10-CM

## 2023-08-12 DIAGNOSIS — I10 Essential (primary) hypertension: Secondary | ICD-10-CM | POA: Diagnosis not present

## 2023-08-12 DIAGNOSIS — E7800 Pure hypercholesterolemia, unspecified: Secondary | ICD-10-CM | POA: Diagnosis not present

## 2023-08-12 DIAGNOSIS — D75839 Thrombocytosis, unspecified: Secondary | ICD-10-CM

## 2023-08-12 DIAGNOSIS — F439 Reaction to severe stress, unspecified: Secondary | ICD-10-CM

## 2023-08-12 LAB — LIPID PANEL
Cholesterol: 193 mg/dL (ref 0–200)
HDL: 83.5 mg/dL (ref >39.00)
LDL Cholesterol: 77 mg/dL (ref 0–99)
NonHDL: 109.03
Total CHOL/HDL Ratio: 2
Triglycerides: 160 mg/dL — ABNORMAL HIGH (ref 0.0–149.0)
VLDL: 32 mg/dL (ref 0.0–40.0)

## 2023-08-12 LAB — HEPATIC FUNCTION PANEL
ALT: 18 U/L (ref 0–35)
AST: 19 U/L (ref 0–37)
Albumin: 4.7 g/dL (ref 3.5–5.2)
Alkaline Phosphatase: 90 U/L (ref 39–117)
Bilirubin, Direct: 0.1 mg/dL (ref 0.0–0.3)
Total Bilirubin: 0.6 mg/dL (ref 0.2–1.2)
Total Protein: 7.3 g/dL (ref 6.0–8.3)

## 2023-08-12 LAB — BASIC METABOLIC PANEL
BUN: 20 mg/dL (ref 6–23)
CO2: 27 meq/L (ref 19–32)
Calcium: 9.7 mg/dL (ref 8.4–10.5)
Chloride: 104 meq/L (ref 96–112)
Creatinine, Ser: 0.66 mg/dL (ref 0.40–1.20)
GFR: 83.39 mL/min (ref >60.00)
Glucose, Bld: 109 mg/dL — ABNORMAL HIGH (ref 70–99)
Potassium: 4.7 meq/L (ref 3.5–5.1)
Sodium: 138 meq/L (ref 135–145)

## 2023-08-12 LAB — CBC WITH DIFFERENTIAL/PLATELET
Basophils Absolute: 0.1 10*3/uL (ref 0.0–0.1)
Basophils Relative: 0.7 % (ref 0.0–3.0)
Eosinophils Absolute: 0.2 10*3/uL (ref 0.0–0.7)
Eosinophils Relative: 2.4 % (ref 0.0–5.0)
HCT: 41.6 % (ref 36.0–46.0)
Hemoglobin: 14.1 g/dL (ref 12.0–15.0)
Lymphocytes Relative: 28.5 % (ref 12.0–46.0)
Lymphs Abs: 2.4 10*3/uL (ref 0.7–4.0)
MCHC: 33.8 g/dL (ref 30.0–36.0)
MCV: 96.1 fL (ref 78.0–100.0)
Monocytes Absolute: 0.7 10*3/uL (ref 0.1–1.0)
Monocytes Relative: 8 % (ref 3.0–12.0)
Neutro Abs: 5.2 10*3/uL (ref 1.4–7.7)
Neutrophils Relative %: 60.4 % (ref 43.0–77.0)
Platelets: 415 10*3/uL — ABNORMAL HIGH (ref 150.0–400.0)
RBC: 4.33 Mil/uL (ref 3.87–5.11)
RDW: 13.6 % (ref 11.5–15.5)
WBC: 8.6 10*3/uL (ref 4.0–10.5)
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
## 2023-08-13 ENCOUNTER — Encounter: Payer: Self-pay | Admitting: Internal Medicine

## 2023-08-13 DIAGNOSIS — T300 Burn of unspecified body region, unspecified degree: Secondary | ICD-10-CM | POA: Insufficient documentation

## 2023-08-13 NOTE — Assessment & Plan Note (Signed)
Worked up by hematology.  Follow cbc.

## 2023-08-13 NOTE — Assessment & Plan Note (Signed)
Blood pressure as outlined.  Continue amlodipine.  Follow pressures.  Follow metabolic panel.

## 2023-08-13 NOTE — Assessment & Plan Note (Signed)
Continue lipitor.  Low cholesterol diet and exercise.  Follow lipid panel and liver function tests.

## 2023-08-13 NOTE — Assessment & Plan Note (Signed)
Blisters as outlined. Exam as outlined. No erythema. No pain. Follow. Notify if any change.

## 2023-08-13 NOTE — Assessment & Plan Note (Signed)
Discussed again the need to quit smoking.  Discussed screening CT chest.  She declined. Will notify me if changes her mind.

## 2023-08-13 NOTE — Assessment & Plan Note (Signed)
Breathing stable.  No increased cough or congestion.

## 2023-08-13 NOTE — Assessment & Plan Note (Signed)
Overall appears to be doing well.  Follow.

## 2023-09-26 ENCOUNTER — Encounter: Payer: Self-pay | Admitting: Podiatry

## 2023-09-26 ENCOUNTER — Ambulatory Visit (INDEPENDENT_AMBULATORY_CARE_PROVIDER_SITE_OTHER)

## 2023-09-26 ENCOUNTER — Ambulatory Visit: Admitting: Podiatry

## 2023-09-26 DIAGNOSIS — M19072 Primary osteoarthritis, left ankle and foot: Secondary | ICD-10-CM

## 2023-09-26 DIAGNOSIS — M19071 Primary osteoarthritis, right ankle and foot: Secondary | ICD-10-CM | POA: Diagnosis not present

## 2023-09-26 DIAGNOSIS — M722 Plantar fascial fibromatosis: Secondary | ICD-10-CM | POA: Diagnosis not present

## 2023-09-26 MED ORDER — METHYLPREDNISOLONE 4 MG PO TBPK: ORAL_TABLET | ORAL | 0 refills | Status: DC

## 2023-09-26 MED ORDER — TRIAMCINOLONE ACETONIDE 40 MG/ML IJ SUSP
40.0000 mg | Freq: Once | INTRAMUSCULAR | Status: AC
  Administered 2023-09-26: 40 mg

## 2023-09-26 MED ORDER — CELECOXIB 200 MG PO CAPS: 200.0000 mg | ORAL_CAPSULE | Freq: Two times a day (BID) | ORAL | 3 refills | Status: DC

## 2023-09-26 NOTE — Progress Notes (Signed)
 Subjective:  [name], female    DOB: January 28, 1946,  MRN: [id number] HPI Chief Complaint  Patient presents with   Foot Pain    Medial foot/ankle right and dorsal midfoot left - thinks has a lot of arthritis, getting sharp pains at times, been wearing PF brace for right that helps, also using "pain stop" cream on both   New Patient (Initial Visit)    Est pt 2016    78 y.o. female presents with the above complaint.   ROS: Denies fever chills nausea muscle aches pains calf pain back pain chest pain shortness of breath.  Past Medical History:  Diagnosis Date   Chronic bronchitis (HCC)    Hypercholesterolemia    Migraines    Past Surgical History:  Procedure Laterality Date   ABDOMINAL HYSTERECTOMY  1979   secondary to fibroids, incidental appendectomy   BREAST EXCISIONAL BIOPSY Right 1979   benign   TONSILLECTOMY  1960    Current Outpatient Medications:    celecoxib (CELEBREX) 200 MG capsule, Take 1 capsule (200 mg total) by mouth 2 (two) times daily., Disp: 60 capsule, Rfl: 3   methylPREDNISolone (MEDROL DOSEPAK) 4 MG TBPK tablet, 6 day dose pack - take as directed, Disp: 21 tablet, Rfl: 0   albuterol (VENTOLIN HFA) 108 (90 Base) MCG/ACT inhaler, Inhale 2 puffs into the lungs every 6 (six) hours as needed for wheezing or shortness of breath., Disp: 18 g, Rfl: 1   amLODipine (NORVASC) 2.5 MG tablet, Take 1 tablet (2.5 mg total) by mouth daily., Disp: 90 tablet, Rfl: 1   atorvastatin (LIPITOR) 20 MG tablet, Take 1 tablet (20 mg total) by mouth daily., Disp: 90 tablet, Rfl: 3   azelastine (ASTELIN) 0.1 % nasal spray, Place 1 spray into both nostrils 2 (two) times daily. Use in each nostril as directed, Disp: 30 mL, Rfl: 1   Ibuprofen 200 MG CAPS, Take by mouth., Disp: , Rfl:   Allergies  Allergen Reactions   No Known Allergies    No Known Drug Allergy    Review of Systems Objective:  There were no vitals filed for this visit.  General: Well developed,  nourished, in no acute distress, alert and oriented x3   Dermatological: Skin is warm, dry and supple bilateral. Nails x 10 are well maintained; remaining integument appears unremarkable at this time. There are no open sores, no preulcerative lesions, no rash or signs of infection present.  Vascular: Dorsalis Pedis artery and Posterior Tibial artery pedal pulses are 2/4 bilateral with immedate capillary fill time. Pedal hair growth present. No varicosities and no lower extremity edema present bilateral.   Neruologic: Grossly intact via light touch bilateral. Vibratory intact via tuning fork bilateral. Protective threshold with Semmes Wienstein monofilament intact to all pedal sites bilateral. Patellar and Achilles deep tendon reflexes 2+ bilateral. No Babinski or clonus noted bilateral.   Musculoskeletal: No gross boney pedal deformities bilateral. No pain, crepitus, or limitation noted with foot and ankle range of motion bilateral. Muscular strength 5/5 in all groups tested bilateral.  She has severe pain with fluctuance on palpation of the posterior tibial tendon as it courses beneath the medial malleolus extending to the navicular tuberosity on the right foot.  She is also having pain on palpation to the dorsal aspect of the left foot with radiating type symptomatology at the level of the tarsometatarsal joints.  No swelling no ecchymosis identified.  Gait: Unassisted, Nonantalgic.    Radiographs:  Radiographs taken today 3 views  bilaterally demonstrate osseously mature individual with generalized demineralization of the bone.  Osteoarthritic changes of the tarsometatarsal joints bilaterally right greater than left but left is most symptomatic.  Right foot also goes on to demonstrate swelling in the medial compartment of the foot near the navicular tuberosity.  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  Plan:  Injected around the posterior tibial tendon today with 10 mg of Kenalog and local anesthetic not injected injecting into the tendon directly itself.  I also injected the dorsal aspect of the left foot 20 mg Kenalog 5 mg of Marcaine.  Started her on methylprednisolone to be followed by 2 mL 100 mg of Celebrex daily.  Follow-up with her in 6 weeks     Calyssa Zobrist T. [place], [place]

## 2023-10-20 DIAGNOSIS — M542 Cervicalgia: Secondary | ICD-10-CM | POA: Diagnosis not present

## 2023-10-20 DIAGNOSIS — H66002 Acute suppurative otitis media without spontaneous rupture of ear drum, left ear: Secondary | ICD-10-CM | POA: Diagnosis not present

## 2023-10-20 DIAGNOSIS — H9209 Otalgia, unspecified ear: Secondary | ICD-10-CM | POA: Diagnosis not present

## 2023-11-07 ENCOUNTER — Ambulatory Visit: Admitting: Podiatry

## 2023-11-09 ENCOUNTER — Ambulatory Visit (INDEPENDENT_AMBULATORY_CARE_PROVIDER_SITE_OTHER): Payer: PPO | Admitting: *Deleted

## 2023-11-09 VITALS — Ht 63.0 in | Wt 147.0 lb

## 2023-11-09 DIAGNOSIS — Z0000 Encounter for general adult medical examination without abnormal findings: Secondary | ICD-10-CM

## 2023-11-09 NOTE — Progress Notes (Signed)
 Subjective:   Monica Morales is a 78 y.o. who presents for a Medicare Wellness preventive visit.  Visit Complete: Virtual I connected with  Riccardo Chamberlain on 11/09/23 by a audio enabled telemedicine application and verified that I am speaking with the correct person using two identifiers.  Patient Location: Home  Provider Location: Office/Clinic  I discussed the limitations of evaluation and management by telemedicine. The patient expressed understanding and agreed to proceed.  Vital Signs: Because this visit was a virtual/telehealth visit, some criteria may be missing or patient reported. Any vitals not documented were not able to be obtained and vitals that have been documented are patient reported.  VideoDeclined- This patient declined Librarian, academic. Therefore the visit was completed with audio only.  Persons Participating in Visit: Patient.  AWV Questionnaire: No: Patient Medicare AWV questionnaire was not completed prior to this visit.  Cardiac Risk Factors include: advanced age (>12men, >34 women);dyslipidemia;hypertension;smoking/ tobacco exposure     Objective:    Today's Vitals   11/09/23 0853  Weight: 147 lb (66.7 kg)  Height: 5\' 3"  (1.6 m)   Body mass index is 26.04 kg/m.     11/09/2023    9:06 AM 10/12/2021    9:10 AM 12/27/2019    9:45 AM 12/26/2018    9:53 AM 05/22/2018    1:22 PM 11/23/2017    1:27 PM 11/07/2017   10:46 AM  Advanced Directives  Does Patient Have a Medical Advance Directive? Yes Yes Yes Yes Yes Yes Yes  Type of Estate agent of [place];Living will Healthcare Power of [place];Living will Healthcare Power of [place];Living will Living will Healthcare Power of [place];Living will Healthcare Power of [place];Living will Living will;Healthcare Power of Attorney  Does patient want to make changes to medical advance directive?  No - Patient declined No - Patient declined No - Patient  declined     Copy of Healthcare Power of Attorney in Chart? No - copy requested No - copy requested No - copy requested    No - copy requested    Current Medications (verified) Outpatient Encounter Medications as of 11/09/2023  Medication Sig   albuterol  (VENTOLIN  HFA) 108 (90 Base) MCG/ACT inhaler Inhale 2 puffs into the lungs every 6 (six) hours as needed for wheezing or shortness of breath.   amLODipine  (NORVASC ) 2.5 MG tablet Take 1 tablet (2.5 mg total) by mouth daily.   atorvastatin  (LIPITOR) 20 MG tablet Take 1 tablet (20 mg total) by mouth daily.   azelastine  (ASTELIN ) 0.1 % nasal spray Place 1 spray into both nostrils 2 (two) times daily. Use in each nostril as directed   Ibuprofen 200 MG CAPS Take by mouth.   [DISCONTINUED] methylPREDNISolone  (MEDROL  DOSEPAK) 4 MG TBPK tablet 6 day dose pack - take as directed   celecoxib  (CELEBREX ) 200 MG capsule Take 1 capsule (200 mg total) by mouth 2 (two) times daily. (Patient not taking: Reported on 11/09/2023)   No facility-administered encounter medications on file as of 11/09/2023.    Allergies (verified) No known allergies and No known drug allergy   History: Past Medical History:  Diagnosis Date   Chronic bronchitis (HCC)    Hypercholesterolemia    Migraines    Past Surgical History:  Procedure Laterality Date   ABDOMINAL HYSTERECTOMY  1979   secondary to fibroids, incidental appendectomy   BREAST EXCISIONAL BIOPSY Right 1979   benign   TONSILLECTOMY  1960   Family History  Problem Relation Age of  Onset   Heart disease Father        myocardial infarction - died age 17   Heart disease Mother    Diabetes Mother    Diabetes Maternal Grandmother    Breast cancer Neg Hx    Colon cancer Neg Hx    Social History   Socioeconomic History   Marital status: Married    Spouse name: Not on file   Number of children: 1   Years of education: Not on file   Highest education level: Not on file  Occupational History   Not on  file  Tobacco Use   Smoking status: Every Day    Current packs/day: 1.00    Average packs/day: 1 pack/day for 50.0 years (50.0 ttl pk-yrs)    Types: Cigarettes   Smokeless tobacco: Never   Tobacco comments:    is going to try electronic cigarettes  Vaping Use   Vaping status: Never Used  Substance and Sexual Activity   Alcohol use: No    Alcohol/week: 0.0 standard drinks of alcohol   Drug use: No   Sexual activity: Not Currently  Other Topics Concern   Not on file  Social History Narrative   Married    Social Drivers of Health   Financial Resource Strain: Low Risk  (11/09/2023)   Overall Financial Resource Strain (CARDIA)    Difficulty of Paying Living Expenses: Not hard at all  Food Insecurity: No Food Insecurity (11/09/2023)   Hunger Vital Sign    Worried About Running Out of Food in the Last Year: Never true    Ran Out of Food in the Last Year: Never true  Transportation Needs: No Transportation Needs (11/09/2023)   PRAPARE - Administrator, Civil Service (Medical): No    Lack of Transportation (Non-Medical): No  Physical Activity: Inactive (11/09/2023)   Exercise Vital Sign    Days of Exercise per Week: 0 days    Minutes of Exercise per Session: 0 min  Stress: No Stress Concern Present (11/09/2023)   [employer] of Occupational Health - Occupational Stress Questionnaire    Feeling of Stress : Not at all  Social Connections: Moderately Integrated (11/09/2023)   Social Connection and Isolation Panel [NHANES]    Frequency of Communication with Friends and Family: More than three times a week    Frequency of Social Gatherings with Friends and Family: More than three times a week    Attends Religious Services: More than 4 times per year    Active Member of [employer] or Organizations: No    Attends Engineer, structural: Never    Marital Status: Married    Tobacco Counseling Ready to quit: No Counseling given: Yes Tobacco comments: is going to try  electronic cigarettes    Clinical Intake:  Pre-visit preparation completed: Yes  Pain : No/denies pain     BMI - recorded: 26.04 Nutritional Status: BMI 25 -29 Overweight Nutritional Risks: None Diabetes: No  No results found for: "HGBA1C"   How often do you need to have someone help you when you read instructions, pamphlets, or other written materials from your doctor or pharmacy?: 1 - Never  Interpreter Needed?: No  Information entered by :: R. Edison Nicholson LPN   Activities of Daily Living     11/09/2023    8:55 AM  In your present state of health, do you have any difficulty performing the following activities:  Hearing? 0  Vision? 0  Difficulty concentrating or making decisions? 0  Walking or climbing stairs? 0  Dressing or bathing? 0  Doing errands, shopping? 0  Preparing Food and eating ? N  Using the Toilet? N  In the past six months, have you accidently leaked urine? N  Do you have problems with loss of bowel control? N  Managing your Medications? N  Managing your Finances? N  Housekeeping or managing your Housekeeping? N    Patient Care Team: Dellar Fenton, MD as PCP - General (Internal Medicine)  Indicate any recent Medical Services you may have received from other than [facility] providers in the past year (date may be approximate).     Assessment:   This is a routine wellness examination for Monica Morales.  Hearing/Vision screen Hearing Screening - Comments:: No issues Vision Screening - Comments:: glasses   Goals Addressed             This Visit's Progress    Patient Stated       Wants to continue to stay active       Depression Screen     11/09/2023    9:00 AM 08/12/2023   10:11 AM 08/02/2022    9:32 AM 04/26/2022    8:11 AM 04/05/2022    9:42 AM 01/21/2022    9:35 AM 11/10/2021    1:47 PM  PHQ 2/9 Scores  PHQ - 2 Score 0 0 0 0 0 0 2  PHQ- 9 Score 4      4    Fall Risk     11/09/2023    8:56 AM 08/12/2023   10:11 AM 08/02/2022    9:32 AM  04/26/2022    8:11 AM 04/05/2022    9:42 AM  Fall Risk   Falls in the past year? 0 0 0 0 0  Number falls in past yr: 0 0 0    Injury with Fall? 0 0 0    Risk for fall due to : No Fall Risks No Fall Risks No Fall Risks No Fall Risks No Fall Risks  Follow up Falls prevention discussed;Falls evaluation completed Falls evaluation completed Falls evaluation completed Falls evaluation completed Falls evaluation completed    MEDICARE RISK AT HOME:  Medicare Risk at Home Any stairs in or around the home?: Yes If so, are there any without handrails?: No Home free of loose throw rugs in walkways, pet beds, electrical cords, etc?: Yes Adequate lighting in your home to reduce risk of falls?: Yes Life alert?: No Use of a cane, walker or w/c?: No Grab bars in the bathroom?: Yes Shower chair or bench in shower?: Yes Elevated toilet seat or a handicapped toilet?: Yes  TIMED UP AND GO:  Was the test performed?  No  Cognitive Function: 6CIT completed    08/27/2015   10:00 AM  MMSE - Mini Mental State Exam  Orientation to time 5  Orientation to Place 5  Registration 3  Attention/ Calculation 5  Recall 3  Language- name 2 objects 2  Language- repeat 1  Language- follow 3 step command 3  Language- read & follow direction 1  Write a sentence 1  Copy design 1  Total score 30        11/09/2023    9:06 AM 12/27/2019   10:04 AM 12/26/2018    9:58 AM 08/30/2016   11:22 AM  6CIT Screen  What Year? 0 points 0 points 0 points 0 points  What month? 0 points 0 points 0 points 0 points  What time? 0 points  0 points 0 points  Count back from 20 0 points  0 points 0 points  Months in reverse 0 points 0 points 0 points 0 points  Repeat phrase 0 points  0 points 0 points  Total Score 0 points  0 points 0 points    Immunizations  There is no immunization history on file for this patient.  Screening Tests Health Maintenance  Topic Date Due   Pneumonia Vaccine 33+ Years old (1 of 2 - PCV) Never  done   Lung Cancer Screening  Never done   Medicare Annual Wellness (AWV)  10/13/2022   Zoster Vaccines- Shingrix (1 of 2) 11/10/2023 (Originally 02/12/1965)   INFLUENZA VACCINE  02/17/2024   DEXA SCAN  Completed   Hepatitis C Screening  Completed   HPV VACCINES  Aged Out   Meningococcal B Vaccine  Aged Out   DTaP/Tdap/Td  Discontinued   Colonoscopy  Discontinued   COVID-19 Vaccine  Discontinued    Health Maintenance  Health Maintenance Due  Topic Date Due   Pneumonia Vaccine 12+ Years old (1 of 2 - PCV) Never done   Lung Cancer Screening  Never done   Medicare Annual Wellness (AWV)  10/13/2022   Health Maintenance Items Addressed: Patient declines all vaccines and lung cancer screening.  Additional Screening:  Vision Screening: Recommended annual ophthalmology exams for early detection of glaucoma and other disorders of the eye. Up to date [facility]  Dental Screening: Recommended annual dental exams for proper oral hygiene  Community Resource Referral / Chronic Care Management: CRR required this visit?  No   CCM required this visit?  No     Plan:     I have personally reviewed and noted the following in the patient's chart:   Medical and social history Use of alcohol, tobacco or illicit drugs  Current medications and supplements including opioid prescriptions. Patient is not currently taking opioid prescriptions. Functional ability and status Nutritional status Physical activity Advanced directives List of other physicians Hospitalizations, surgeries, and ER visits in previous 12 months Vitals Screenings to include cognitive, depression, and falls Referrals and appointments  In addition, I have reviewed and discussed with patient certain preventive protocols, quality metrics, and best practice recommendations. A written personalized care plan for preventive services as well as general preventive health recommendations were provided to patient.     Felicitas Horse, LPN   1/61/0960   After Visit Summary: (Pick Up) Due to this being a telephonic visit, with patients personalized plan was offered to patient and patient has requested to Pick up at office.  Notes: Nothing significant to report at this time.

## 2023-11-09 NOTE — Patient Instructions (Signed)
 Ms. Schroyer , Thank you for taking time to come for your Medicare Wellness Visit. I appreciate your ongoing commitment to your health goals. Please review the following plan we discussed and let me know if I can assist you in the future.   Referrals/Orders/Follow-Ups/Clinician Recommendations: Consider updating your vaccines.  This is a list of the screening recommended for you and due dates:  Health Maintenance  Topic Date Due   Pneumonia Vaccine (1 of 2 - PCV) Never done   Screening for Lung Cancer  Never done   Zoster (Shingles) Vaccine (1 of 2) 11/10/2023*   Mammogram  01/13/2024   Flu Shot  02/17/2024   Medicare Annual Wellness Visit  11/08/2024   DEXA scan (bone density measurement)  Completed   Hepatitis C Screening  Completed   HPV Vaccine  Aged Out   Meningitis B Vaccine  Aged Out   DTaP/Tdap/Td vaccine  Discontinued   Colon Cancer Screening  Discontinued   COVID-19 Vaccine  Discontinued  *Topic was postponed. The date shown is not the original due date.    Advanced directives: (Copy Requested) Please bring a copy of your health care power of attorney and living will to the office to be added to your chart at your convenience. You can mail to [facility] [address] [place], [place] [address] or email to [email or web address]  Next Medicare Annual Wellness Visit scheduled for next year: Yes 11/14/24 @ 8:50

## 2023-11-11 ENCOUNTER — Other Ambulatory Visit: Payer: Self-pay | Admitting: Internal Medicine

## 2023-12-14 ENCOUNTER — Ambulatory Visit: Payer: PPO | Admitting: Internal Medicine

## 2023-12-14 ENCOUNTER — Encounter: Payer: Self-pay | Admitting: Internal Medicine

## 2023-12-14 VITALS — BP 118/70 | HR 78 | Temp 98.0°F | Resp 16 | Ht 63.0 in | Wt 146.2 lb

## 2023-12-14 DIAGNOSIS — H9202 Otalgia, left ear: Secondary | ICD-10-CM | POA: Diagnosis not present

## 2023-12-14 DIAGNOSIS — Z720 Tobacco use: Secondary | ICD-10-CM

## 2023-12-14 DIAGNOSIS — D75839 Thrombocytosis, unspecified: Secondary | ICD-10-CM

## 2023-12-14 DIAGNOSIS — L989 Disorder of the skin and subcutaneous tissue, unspecified: Secondary | ICD-10-CM

## 2023-12-14 DIAGNOSIS — J42 Unspecified chronic bronchitis: Secondary | ICD-10-CM

## 2023-12-14 DIAGNOSIS — Z0000 Encounter for general adult medical examination without abnormal findings: Secondary | ICD-10-CM | POA: Diagnosis not present

## 2023-12-14 DIAGNOSIS — E7800 Pure hypercholesterolemia, unspecified: Secondary | ICD-10-CM

## 2023-12-14 DIAGNOSIS — Z1231 Encounter for screening mammogram for malignant neoplasm of breast: Secondary | ICD-10-CM

## 2023-12-14 DIAGNOSIS — I10 Essential (primary) hypertension: Secondary | ICD-10-CM

## 2023-12-14 DIAGNOSIS — F439 Reaction to severe stress, unspecified: Secondary | ICD-10-CM | POA: Diagnosis not present

## 2023-12-14 LAB — BASIC METABOLIC PANEL WITH GFR
BUN: 17 mg/dL (ref 6–23)
CO2: 29 meq/L (ref 19–32)
Calcium: 9.4 mg/dL (ref 8.4–10.5)
Chloride: 105 meq/L (ref 96–112)
Creatinine, Ser: 0.66 mg/dL (ref 0.40–1.20)
GFR: 83.19 mL/min (ref >60.00)
Glucose, Bld: 94 mg/dL (ref 70–99)
Potassium: 4.1 meq/L (ref 3.5–5.1)
Sodium: 140 meq/L (ref 135–145)

## 2023-12-14 LAB — LIPID PANEL
Cholesterol: 183 mg/dL (ref 0–200)
HDL: 75.3 mg/dL (ref >39.00)
LDL Cholesterol: 76 mg/dL (ref 0–99)
NonHDL: 107.23
Total CHOL/HDL Ratio: 2
Triglycerides: 155 mg/dL — ABNORMAL HIGH (ref 0.0–149.0)
VLDL: 31 mg/dL (ref 0.0–40.0)

## 2023-12-14 LAB — CBC WITH DIFFERENTIAL/PLATELET
Basophils Absolute: 0 10*3/uL (ref 0.0–0.1)
Basophils Relative: 0.6 % (ref 0.0–3.0)
Eosinophils Absolute: 0.2 10*3/uL (ref 0.0–0.7)
Eosinophils Relative: 2.6 % (ref 0.0–5.0)
HCT: 40.4 % (ref 36.0–46.0)
Hemoglobin: 13.8 g/dL (ref 12.0–15.0)
Lymphocytes Relative: 30.1 % (ref 12.0–46.0)
Lymphs Abs: 2.2 10*3/uL (ref 0.7–4.0)
MCHC: 34.2 g/dL (ref 30.0–36.0)
MCV: 94.8 fl (ref 78.0–100.0)
Monocytes Absolute: 0.6 10*3/uL (ref 0.1–1.0)
Monocytes Relative: 8.3 % (ref 3.0–12.0)
Neutro Abs: 4.3 10*3/uL (ref 1.4–7.7)
Neutrophils Relative %: 58.4 % (ref 43.0–77.0)
Platelets: 370 10*3/uL (ref 150.0–400.0)
RBC: 4.26 Mil/uL (ref 3.87–5.11)
RDW: 13.2 % (ref 11.5–15.5)
WBC: 7.4 10*3/uL (ref 4.0–10.5)

## 2023-12-14 LAB — HEPATIC FUNCTION PANEL
ALT: 15 U/L (ref 0–35)
AST: 18 U/L (ref 0–37)
Albumin: 4.4 g/dL (ref 3.5–5.2)
Alkaline Phosphatase: 81 U/L (ref 39–117)
Bilirubin, Direct: 0.1 mg/dL (ref 0.0–0.3)
Total Bilirubin: 0.6 mg/dL (ref 0.2–1.2)
Total Protein: 7 g/dL (ref 6.0–8.3)

## 2023-12-14 LAB — TSH: TSH: 2.22 u[IU]/mL (ref 0.35–5.50)

## 2023-12-14 MED ORDER — AMLODIPINE BESYLATE 2.5 MG PO TABS: 2.5000 mg | ORAL_TABLET | Freq: Every day | ORAL | 1 refills | Status: DC

## 2023-12-14 MED ORDER — CLOTRIMAZOLE-BETAMETHASONE 1-0.05 % EX CREA: 1.0000 | TOPICAL_CREAM | Freq: Every day | CUTANEOUS | 0 refills | Status: AC

## 2023-12-14 MED ORDER — ATORVASTATIN CALCIUM 20 MG PO TABS: 20.0000 mg | ORAL_TABLET | Freq: Every day | ORAL | 3 refills | Status: AC

## 2023-12-14 NOTE — Progress Notes (Signed)
 [name], female    DOB: Aug 09, 1945, 78 y.o.   MRN: [id number]  Patient here for  Chief Complaint  Patient presents with   Annual Exam    HPI Here for a physical exam. 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 S/u kenalog  injection and started on methylprednisonone and celebrex . Taking amlodipine  - blood pressure. Continues to smoke. Reports she was evaluated 10/2023 urgent care- left ear pain. States was diagnosed with ear infection. Took amoxiciillin and decongestant.  Also using nasal spray. Also evaluated - Dr Broadus Canes office - reported blisters behind her ear. Treated with abx - amox and prednisone . Had cerumen removed. She is feeling better and pain is better, but still has residual sensitivity behind he rear - extending up lateral scalp. Discussed possible shingles.  Rash/blisters - resolved. No headache. No sinus congestion. No ear pain now. Breathing stable. Increased stress with daughter-n-law's medical issues. Has good support. Does request referral to dermatology- left lesions/skin check.    Past Medical History:  Diagnosis Date   Chronic bronchitis (HCC)    Hypercholesterolemia    Migraines    Past Surgical History:  Procedure Laterality Date   ABDOMINAL HYSTERECTOMY  1979   secondary to fibroids, incidental appendectomy   BREAST EXCISIONAL BIOPSY Right 1979   benign   TONSILLECTOMY  1960   Family History  Problem Relation Age of Onset   Heart disease Father        myocardial infarction - died age 8   Heart disease Mother    Diabetes Mother    Diabetes Maternal Grandmother    Breast cancer Neg Hx    Colon cancer Neg Hx    Social History   Socioeconomic History   Marital status: Married    Spouse name: Not on file   Number of children: 1   Years of education: Not on file   Highest education level: Not on file  Occupational History   Not on file  Tobacco Use   Smoking  status: Every Day    Current packs/day: 1.00    Average packs/day: 1 pack/day for 50.0 years (50.0 ttl pk-yrs)    Types: Cigarettes   Smokeless tobacco: Never   Tobacco comments:    is going to try electronic cigarettes  Vaping Use   Vaping status: Never Used  Substance and Sexual Activity   Alcohol use: No    Alcohol/week: 0.0 standard drinks of alcohol   Drug use: No   Sexual activity: Not Currently  Other Topics Concern   Not on file  Social History Narrative   Married    Social Drivers of Health   Financial Resource Strain: Low Risk  (11/09/2023)   Overall Financial Resource Strain (CARDIA)    Difficulty of Paying Living Expenses: Not hard at all  Food Insecurity: No Food Insecurity (11/09/2023)   Hunger Vital Sign    Worried About Running Out of Food in the Last Year: Never true    Ran Out of Food in the Last Year: Never true  Transportation Needs: No Transportation Needs (11/09/2023)   PRAPARE - Administrator, Civil Service (Medical): No    Lack of Transportation (Non-Medical): No  Physical Activity: Inactive (11/09/2023)   Exercise Vital Sign    Days of Exercise per Week: 0 days    Minutes of Exercise per Session: 0 min  Stress: No Stress Concern Present (11/09/2023)  [employer] of Occupational Health - Occupational Stress Questionnaire    Feeling of Stress : Not at all  Social Connections: Moderately Integrated (11/09/2023)   Social Connection and Isolation Panel [NHANES]    Frequency of Communication with Friends and Family: More than three times a week    Frequency of Social Gatherings with Friends and Family: More than three times a week    Attends Religious Services: More than 4 times per year    Active Member of [employer] or Organizations: No    Attends Banker Meetings: Never    Marital Status: Married     Review of Systems  Constitutional:  Negative for appetite change and unexpected weight change.  HENT:  Negative for  congestion, sinus pressure and sore throat.        No ear pain now. No sinus symptoms.   Eyes:  Negative for pain and visual disturbance.  Respiratory:  Negative for cough, chest tightness and shortness of breath.   Cardiovascular:  Negative for chest pain, palpitations and leg swelling.  Gastrointestinal:  Negative for abdominal pain, diarrhea, nausea and vomiting.  Genitourinary:  Negative for difficulty urinating and dysuria.  Musculoskeletal:  Negative for joint swelling and myalgias.  Skin:  Negative for color change and rash.  Neurological:  Negative for dizziness and headaches.  Hematological:  Negative for adenopathy. Does not bruise/bleed easily.  Psychiatric/Behavioral:  Negative for agitation and dysphoric mood.        Objective:     BP 118/70   Pulse 78   Temp 98 F (36.7 C)   Resp 16   Ht 5\' 3"  (1.6 m)   Wt 146 lb 3.2 oz (66.3 kg)   SpO2 98%   BMI 25.90 kg/m  Wt Readings from Last 3 Encounters:  12/14/23 146 lb 3.2 oz (66.3 kg)  11/09/23 147 lb (66.7 kg)  08/12/23 147 lb 9.6 oz (67 kg)    Physical Exam Vitals reviewed.  Constitutional:      General: She is not in acute distress.    Appearance: Normal appearance.  HENT:     Head: Normocephalic and atraumatic.     Right Ear: External ear normal.     Left Ear: External ear normal.     Mouth/Throat:     Pharynx: No oropharyngeal exudate or posterior oropharyngeal erythema.  Eyes:     General: No scleral icterus.       Right eye: No discharge.        Left eye: No discharge.     Conjunctiva/sclera: Conjunctivae normal.  Neck:     Thyroid : No thyromegaly.  Cardiovascular:     Rate and Rhythm: Normal rate and regular rhythm.  Pulmonary:     Effort: No respiratory distress.     Breath sounds: Normal breath sounds. No wheezing.  Abdominal:     General: Bowel sounds are normal.     Palpations: Abdomen is soft.     Tenderness: There is no abdominal tenderness.  Musculoskeletal:        General: No  swelling or tenderness.     Cervical back: Neck supple. No tenderness.  Lymphadenopathy:     Cervical: No cervical adenopathy.  Skin:    Findings: No erythema or rash.  Neurological:     Mental Status: She is alert.  Psychiatric:        Mood and Affect: Mood normal.        Behavior: Behavior normal.  Outpatient Encounter Medications as of 12/14/2023  Medication Sig   clotrimazole -betamethasone  (LOTRISONE ) cream Apply 1 Application topically daily.   albuterol  (VENTOLIN  HFA) 108 (90 Base) MCG/ACT inhaler Inhale 2 puffs into the lungs every 6 (six) hours as needed for wheezing or shortness of breath.   amLODipine  (NORVASC ) 2.5 MG tablet Take 1 tablet (2.5 mg total) by mouth daily.   atorvastatin  (LIPITOR) 20 MG tablet Take 1 tablet (20 mg total) by mouth daily.   azelastine  (ASTELIN ) 0.1 % nasal spray Place 1 spray into both nostrils 2 (two) times daily. Use in each nostril as directed   Ibuprofen 200 MG CAPS Take by mouth.   [DISCONTINUED] amLODipine  (NORVASC ) 2.5 MG tablet Take 1 tablet (2.5 mg total) by mouth daily.   [DISCONTINUED] atorvastatin  (LIPITOR) 20 MG tablet Take 1 tablet (20 mg total) by mouth daily.   [DISCONTINUED] celecoxib  (CELEBREX ) 200 MG capsule Take 1 capsule (200 mg total) by mouth 2 (two) times daily. (Patient not taking: Reported on 11/09/2023)   No facility-administered encounter medications on file as of 12/14/2023.     Lab Results  Component Value Date   WBC 7.4 12/14/2023   HGB 13.8 12/14/2023   HCT 40.4 12/14/2023   PLT 370.0 12/14/2023   GLUCOSE 94 12/14/2023   CHOL 183 12/14/2023   TRIG 155.0 (H) 12/14/2023   HDL 75.30 12/14/2023   LDLDIRECT 80.0 08/11/2021   LDLCALC 76 12/14/2023   ALT 15 12/14/2023   AST 18 12/14/2023   NA 140 12/14/2023   K 4.1 12/14/2023   CL 105 12/14/2023   CREATININE 0.66 12/14/2023   BUN 17 12/14/2023   CO2 29 12/14/2023   TSH 2.22 12/14/2023   MICROALBUR 0.7 01/24/2017    MM 3D SCREENING MAMMOGRAM  BILATERAL BREAST Result Date: 01/17/2023 CLINICAL DATA:  Screening. EXAM: DIGITAL SCREENING BILATERAL MAMMOGRAM WITH TOMOSYNTHESIS AND CAD TECHNIQUE: Bilateral screening digital craniocaudal and mediolateral oblique mammograms were obtained. Bilateral screening digital breast tomosynthesis was performed. The images were evaluated with computer-aided detection. COMPARISON:  Previous exam(s). ACR Breast Density Category b: There are scattered areas of fibroglandular density. FINDINGS: There are no findings suspicious for malignancy. IMPRESSION: No mammographic evidence of malignancy. A result letter of this screening mammogram will be mailed directly to the patient. RECOMMENDATION: Screening mammogram in one year. (Code:SM-B-01Y) BI-RADS CATEGORY  1: Negative. Electronically Signed   By: Allena Ito M.D.   On: 01/17/2023 12:28       Assessment & Plan:  Health care maintenance Assessment & Plan: Physical today 12/14/23  Mammogram 01/13/23 - Birads I.  Colnoscopy 12/2012 - Diverticulosis.    Thrombocytosis (HCC) Assessment & Plan: Worked up by hematology.  Follow cbc.   Orders: -     CBC with Differential/Platelet  Hypercholesterolemia Assessment & Plan: Continue lipitor.  Low cholesterol diet and exercise.  Follow lipid panel and liver function tests.   Orders: -     Hepatic function panel -     Lipid panel -     TSH  Hypertension, unspecified type Assessment & Plan: Blood pressure as outlined.  Continue amlodipine . Follow pressures.  Follow metabolic panel. No changes in medication today.   Orders: -     Basic metabolic panel with GFR  Visit for screening mammogram -     3D Screening Mammogram, Left and Right; Future  Chronic bronchitis, unspecified chronic bronchitis type (HCC) Assessment & Plan: Breathing stable. No increased cough or congestion.    Stress Assessment & Plan: Increased stress. Discussed. Has  good support. Will notify me if feels needs further  intervention.    Tobacco abuse Assessment & Plan: Have discussd the need to quit smoking. Have also discussed screening CT chest.  She has declined. Will notify me if changes her mind.     Left ear pain Assessment & Plan: No pain now. Her symptoms appear to be c/w shingles. Had a blister rash with pain - localized behind her ear. On exam today - no rash. TMs wnl. Canal - ok. No cerumen impaction.    Skin lesion Assessment & Plan: Skin lesion. Leg. Request referral to dermatology - [facility]  Orders: -     Ambulatory referral to Dermatology  Other orders -     amLODIPine  Besylate; Take 1 tablet (2.5 mg total) by mouth daily.  Dispense: 90 tablet; Refill: 1 -     Atorvastatin  Calcium ; Take 1 tablet (20 mg total) by mouth daily.  Dispense: 90 tablet; Refill: 3 -     Clotrimazole -Betamethasone ; Apply 1 Application topically daily.  Dispense: 30 g; Refill: 0     Dellar Fenton, MD

## 2023-12-14 NOTE — Assessment & Plan Note (Signed)
 Physical today 12/14/23  Mammogram 01/13/23 - Birads I.  Colnoscopy 12/2012 - Diverticulosis.

## 2023-12-14 NOTE — Assessment & Plan Note (Signed)
 Worked up by hematology.  Follow cbc.

## 2023-12-15 ENCOUNTER — Ambulatory Visit: Payer: Self-pay | Admitting: Internal Medicine

## 2023-12-18 ENCOUNTER — Encounter: Payer: Self-pay | Admitting: Internal Medicine

## 2023-12-18 DIAGNOSIS — H9209 Otalgia, unspecified ear: Secondary | ICD-10-CM | POA: Insufficient documentation

## 2023-12-18 NOTE — Assessment & Plan Note (Signed)
 Blood pressure as outlined.  Continue amlodipine . Follow pressures.  Follow metabolic panel. No changes in medication today.

## 2023-12-18 NOTE — Assessment & Plan Note (Signed)
 Breathing stable.  No increased cough or congestion.

## 2023-12-18 NOTE — Assessment & Plan Note (Signed)
 Have discussd the need to quit smoking. Have also discussed screening CT chest.  She has declined. Will notify me if changes her mind.

## 2023-12-18 NOTE — Assessment & Plan Note (Signed)
 Increased stress. Discussed. Has good support. Will notify me if feels needs further intervention.

## 2023-12-18 NOTE — Assessment & Plan Note (Signed)
 Skin lesion. Leg. Request referral to dermatology - [facility]

## 2023-12-18 NOTE — Assessment & Plan Note (Signed)
 Continue lipitor.  Low cholesterol diet and exercise.  Follow lipid panel and liver function tests.

## 2023-12-18 NOTE — Assessment & Plan Note (Signed)
 No pain now. Her symptoms appear to be c/w shingles. Had a blister rash with pain - localized behind her ear. On exam today - no rash. TMs wnl. Canal - ok. No cerumen impaction.

## 2024-01-11 ENCOUNTER — Ambulatory Visit: Admitting: Podiatry

## 2024-01-11 ENCOUNTER — Encounter: Payer: Self-pay | Admitting: Podiatry

## 2024-01-11 DIAGNOSIS — M19071 Primary osteoarthritis, right ankle and foot: Secondary | ICD-10-CM

## 2024-01-11 DIAGNOSIS — M19072 Primary osteoarthritis, left ankle and foot: Secondary | ICD-10-CM | POA: Diagnosis not present

## 2024-01-11 MED ORDER — TRIAMCINOLONE ACETONIDE 40 MG/ML IJ SUSP
40.0000 mg | Freq: Once | INTRAMUSCULAR | Status: AC
  Administered 2024-01-11: 40 mg

## 2024-01-11 NOTE — Progress Notes (Signed)
 She presents today for follow-up of her arthritis to her midfoot bilaterally.  States that they both got better for a couple of weeks but now they started to hurt like they were before.  She states that she was here just a few weeks ago however its actually been since the beginning of March so she has had several good weeks of pain relief.  Her husband states that her pain just started back when he started canning beings last week.  Objective: Vital signs are stable alert oriented x 3 she has pain on frontal plane range of motion at the tarsometatarsal joints bilaterally palpable spurs dorsally.  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  Plan: Injected dorsal aspect bilateral foot 10 mg Kenalog  and local anesthetic.  Tolerated procedure well without complications follow-up with her on an as-needed basis or in 3 months.

## 2024-01-16 ENCOUNTER — Ambulatory Visit
Admission: RE | Admit: 2024-01-16 | Discharge: 2024-01-16 | Disposition: A | Discharge status: Home / Self Care | Source: Ambulatory Visit | Attending: Internal Medicine | Admitting: Internal Medicine

## 2024-01-16 DIAGNOSIS — Z1231 Encounter for screening mammogram for malignant neoplasm of breast: Secondary | ICD-10-CM | POA: Insufficient documentation

## 2024-01-30 DIAGNOSIS — H2513 Age-related nuclear cataract, bilateral: Secondary | ICD-10-CM | POA: Diagnosis not present

## 2024-02-06 DIAGNOSIS — H2513 Age-related nuclear cataract, bilateral: Secondary | ICD-10-CM | POA: Diagnosis not present

## 2024-02-06 DIAGNOSIS — H04123 Dry eye syndrome of bilateral lacrimal glands: Secondary | ICD-10-CM | POA: Diagnosis not present

## 2024-02-06 DIAGNOSIS — H35373 Puckering of macula, bilateral: Secondary | ICD-10-CM | POA: Diagnosis not present

## 2024-02-20 DIAGNOSIS — H2513 Age-related nuclear cataract, bilateral: Secondary | ICD-10-CM | POA: Diagnosis not present

## 2024-02-20 DIAGNOSIS — H2512 Age-related nuclear cataract, left eye: Secondary | ICD-10-CM | POA: Diagnosis not present

## 2024-03-07 ENCOUNTER — Encounter: Payer: Self-pay | Admitting: Ophthalmology

## 2024-03-12 NOTE — Discharge Instructions (Signed)

## 2024-03-14 ENCOUNTER — Ambulatory Visit: Payer: Self-pay | Admitting: Anesthesiology

## 2024-03-14 ENCOUNTER — Ambulatory Visit
Admission: RE | Admit: 2024-03-14 | Discharge: 2024-03-14 | Disposition: A | Discharge status: Home / Self Care | Attending: Ophthalmology | Admitting: Ophthalmology

## 2024-03-14 ENCOUNTER — Encounter: Payer: Self-pay | Admitting: Ophthalmology

## 2024-03-14 ENCOUNTER — Other Ambulatory Visit: Payer: Self-pay

## 2024-03-14 ENCOUNTER — Encounter
Admission: RE | Disposition: A | Discharge status: Home / Self Care | Payer: Self-pay | Source: Home / Self Care | Attending: Ophthalmology

## 2024-03-14 DIAGNOSIS — J449 Chronic obstructive pulmonary disease, unspecified: Secondary | ICD-10-CM | POA: Insufficient documentation

## 2024-03-14 DIAGNOSIS — K219 Gastro-esophageal reflux disease without esophagitis: Secondary | ICD-10-CM | POA: Insufficient documentation

## 2024-03-14 DIAGNOSIS — F17210 Nicotine dependence, cigarettes, uncomplicated: Secondary | ICD-10-CM | POA: Diagnosis not present

## 2024-03-14 DIAGNOSIS — I10 Essential (primary) hypertension: Secondary | ICD-10-CM | POA: Diagnosis not present

## 2024-03-14 DIAGNOSIS — Z8249 Family history of ischemic heart disease and other diseases of the circulatory system: Secondary | ICD-10-CM | POA: Diagnosis not present

## 2024-03-14 DIAGNOSIS — H2512 Age-related nuclear cataract, left eye: Secondary | ICD-10-CM | POA: Insufficient documentation

## 2024-03-14 HISTORY — PX: CATARACT EXTRACTION W/PHACO: SHX586

## 2024-03-14 SURGERY — PHACOEMULSIFICATION, CATARACT, WITH IOL INSERTION
Anesthesia: Monitor Anesthesia Care | Site: Eye | Laterality: Left

## 2024-03-14 MED ORDER — TETRACAINE HCL 0.5 % OP SOLN
OPHTHALMIC | Status: AC
  Filled 2024-03-14: qty 4

## 2024-03-14 MED ORDER — ARMC OPHTHALMIC DILATING DROPS
1.0000 | OPHTHALMIC | Status: DC | PRN
  Administered 2024-03-14 (×3): 1 via OPHTHALMIC

## 2024-03-14 MED ORDER — FENTANYL CITRATE (PF) 100 MCG/2ML IJ SOLN
INTRAMUSCULAR | Status: AC
  Filled 2024-03-14: qty 2

## 2024-03-14 MED ORDER — MIDAZOLAM HCL 2 MG/2ML IJ SOLN
INTRAMUSCULAR | Status: DC | PRN
  Administered 2024-03-14: 1 mg via INTRAVENOUS

## 2024-03-14 MED ORDER — MIDAZOLAM HCL 2 MG/2ML IJ SOLN
INTRAMUSCULAR | Status: AC
  Filled 2024-03-14: qty 2

## 2024-03-14 MED ORDER — BRIMONIDINE TARTRATE-TIMOLOL 0.2-0.5 % OP SOLN
OPHTHALMIC | Status: DC | PRN
  Administered 2024-03-14: 1 [drp] via OPHTHALMIC

## 2024-03-14 MED ORDER — TETRACAINE HCL 0.5 % OP SOLN
1.0000 [drp] | OPHTHALMIC | Status: DC | PRN
  Administered 2024-03-14 (×3): 1 [drp] via OPHTHALMIC

## 2024-03-14 MED ORDER — FENTANYL CITRATE (PF) 100 MCG/2ML IJ SOLN
INTRAMUSCULAR | Status: DC | PRN
  Administered 2024-03-14 (×2): 50 ug via INTRAVENOUS

## 2024-03-14 MED ORDER — GLYCOPYRROLATE 0.2 MG/ML IJ SOLN
INTRAMUSCULAR | Status: AC
  Filled 2024-03-14: qty 1

## 2024-03-14 MED ORDER — SIGHTPATH DOSE#1 BSS IO SOLN
INTRAOCULAR | Status: DC | PRN
  Administered 2024-03-14: 15 mL via INTRAOCULAR

## 2024-03-14 MED ORDER — CEFUROXIME OPHTHALMIC INJECTION 1 MG/0.1 ML
INJECTION | OPHTHALMIC | Status: DC | PRN
  Administered 2024-03-14: .1 mL via INTRACAMERAL

## 2024-03-14 MED ORDER — ARMC OPHTHALMIC DILATING DROPS
OPHTHALMIC | Status: AC
  Filled 2024-03-14: qty 0.5

## 2024-03-14 MED ORDER — LIDOCAINE HCL (PF) 2 % IJ SOLN
INTRAMUSCULAR | Status: DC | PRN
  Administered 2024-03-14: 2 mL

## 2024-03-14 MED ORDER — SIGHTPATH DOSE#1 BSS IO SOLN
INTRAOCULAR | Status: DC | PRN
  Administered 2024-03-14: 71 mL via OPHTHALMIC

## 2024-03-14 MED ORDER — SIGHTPATH DOSE#1 NA HYALUR & NA CHOND-NA HYALUR IO KIT
PACK | INTRAOCULAR | Status: DC | PRN
Start: 2024-03-14 — End: 2024-03-14
  Administered 2024-03-14: 1 via OPHTHALMIC

## 2024-03-14 SURGICAL SUPPLY — 8 items
FEE CATARACT SUITE SIGHTPATH (MISCELLANEOUS) ×1 IMPLANT
GLOVE BIOGEL PI IND STRL 8 (GLOVE) ×1 IMPLANT
GLOVE SURG LX STRL 7.5 STRW (GLOVE) ×1 IMPLANT
GLOVE SURG SYN 6.5 PF PI BL (GLOVE) ×1 IMPLANT
LENS IOL TECNIS EYHANCE 18.5 (Intraocular Lens) IMPLANT
NDL FILTER BLUNT 18X1 1/2 (NEEDLE) ×1 IMPLANT
NEEDLE FILTER BLUNT 18X1 1/2 (NEEDLE) ×1 IMPLANT
SYR 3ML LL SCALE MARK (SYRINGE) ×1 IMPLANT

## 2024-03-14 NOTE — Anesthesia Postprocedure Evaluation (Signed)
 Anesthesia Post Note  Patient: [name]  Procedure(s) Performed: PHACOEMULSIFICATION, CATARACT, WITH IOL INSERTION 7.08 00:33.9 (Left: Eye)  Patient location during evaluation: PACU Anesthesia Type: MAC Level of consciousness: awake and alert Pain management: pain level controlled Vital Signs Assessment: post-procedure vital signs reviewed and stable Respiratory status: spontaneous breathing, nonlabored ventilation, respiratory function stable and patient connected to nasal cannula oxygen Cardiovascular status: stable and blood pressure returned to baseline Postop Assessment: no apparent nausea or vomiting Anesthetic complications: no   No notable events documented.   Last Vitals:  Vitals:   03/14/24 0905 03/14/24 0910  BP: (!) 109/96 137/80  Pulse: 73 76  Resp: 18 14  Temp: (!) 36.1 C (!) 36.1 C  SpO2: 97% 95%    Last Pain:  Vitals:   03/14/24 0910  TempSrc:   PainSc: 0-No pain                 Lendia LITTIE Mae

## 2024-03-14 NOTE — Transfer of Care (Addendum)
 Immediate Anesthesia Transfer of Care Note  Patient: Monica Morales  Procedure(s) Performed: PHACOEMULSIFICATION, CATARACT, WITH IOL INSERTION 7.08 00:33.9 (Left: Eye)  Patient Location: PACU  Anesthesia Type: MAC  Level of Consciousness: awake, alert  and patient cooperative  Airway and Oxygen Therapy: Patient Spontanous Breathing and Patient connected to supplemental oxygen  Post-op Assessment: Post-op Vital signs reviewed, Patient's Cardiovascular Status Stable, Respiratory Function Stable, Patent Airway and No signs of Nausea or vomiting  Post-op Vital Signs: Reviewed and stable  Complications: No notable events documented.

## 2024-03-14 NOTE — Anesthesia Preprocedure Evaluation (Signed)
 Anesthesia Evaluation  Patient identified by MRN, date of birth, ID band Patient awake    Reviewed: Allergy & Precautions, NPO status , Patient's Chart, lab work & pertinent test results  History of Anesthesia Complications Negative for: history of anesthetic complications  Airway Mallampati: III  TM Distance: >3 FB Neck ROM: full    Dental no notable dental hx.    Pulmonary COPD,  COPD inhaler, Current Smoker and Patient abstained from smoking.   Pulmonary exam normal        Cardiovascular hypertension, On Medications Normal cardiovascular exam     Neuro/Psych negative neurological ROS  negative psych ROS   GI/Hepatic Neg liver ROS,GERD  Medicated and Controlled,,  Endo/Other  negative endocrine ROS    Renal/GU      Musculoskeletal   Abdominal   Peds  Hematology negative hematology ROS (+)   Anesthesia Other Findings Past Medical History: No date: Chronic bronchitis (HCC) No date: Hypercholesterolemia No date: Migraines  Past Surgical History: 1979: ABDOMINAL HYSTERECTOMY     Comment:  secondary to fibroids, incidental appendectomy 1979: BREAST EXCISIONAL BIOPSY; Right     Comment:  benign 1960: TONSILLECTOMY  BMI    Body Mass Index: 24.98 kg/m      Reproductive/Obstetrics negative OB ROS                              Anesthesia Physical Anesthesia Plan  ASA: 3  Anesthesia Plan: MAC   Post-op Pain Management: Minimal or no pain anticipated   Induction: Intravenous  PONV Risk Score and Plan: 2  Airway Management Planned: Natural Airway and Nasal Cannula  Additional Equipment:   Intra-op Plan:   Post-operative Plan:   Informed Consent: I have reviewed the patients History and Physical, chart, labs and discussed the procedure including the risks, benefits and alternatives for the proposed anesthesia with the patient or authorized representative who has indicated  his/her understanding and acceptance.     Dental Advisory Given  Plan Discussed with: Anesthesiologist, CRNA and Surgeon  Anesthesia Plan Comments: (Patient consented for risks of anesthesia including but not limited to:  - adverse reactions to medications - damage to eyes, teeth, lips or other oral mucosa - nerve damage due to positioning  - sore throat or hoarseness - Damage to heart, brain, nerves, lungs, other parts of body or loss of life  Patient voiced understanding and assent.)        Anesthesia Quick Evaluation

## 2024-03-14 NOTE — H&P (Signed)
 [facility]   Primary Care Physician:  Glendia Shad, MD Ophthalmologist: Dr. Dene Etienne  Pre-Procedure History & Physical: HPI:  Monica Morales is a 78 y.o. female here for ophthalmic surgery.   Past Medical History:  Diagnosis Date   Chronic bronchitis (HCC)    Hypercholesterolemia    Migraines     Past Surgical History:  Procedure Laterality Date   ABDOMINAL HYSTERECTOMY  1979   secondary to fibroids, incidental appendectomy   BREAST EXCISIONAL BIOPSY Right 1979   benign   TONSILLECTOMY  1960    Prior to Admission medications   Medication Sig Start Date End Date Taking? Authorizing Provider  albuterol  (VENTOLIN  HFA) 108 (90 Base) MCG/ACT inhaler Inhale 2 puffs into the lungs every 6 (six) hours as needed for wheezing or shortness of breath. 05/24/22  Yes Glendia Shad, MD  amLODipine  (NORVASC ) 2.5 MG tablet Take 1 tablet (2.5 mg total) by mouth daily. 12/14/23  Yes Glendia Shad, MD  atorvastatin  (LIPITOR) 20 MG tablet Take 1 tablet (20 mg total) by mouth daily. 12/14/23  Yes Glendia Shad, MD  azelastine  (ASTELIN ) 0.1 % nasal spray Place 1 spray into both nostrils 2 (two) times daily. Use in each nostril as directed 05/11/23  Yes Glendia Shad, MD  clotrimazole -betamethasone  (LOTRISONE ) cream Apply 1 Application topically daily. 12/14/23  Yes Glendia Shad, MD  Ibuprofen 200 MG CAPS Take by mouth.   Yes [provider]    Allergies as of 02/09/2024 - Review Complete 01/11/2024  Allergen Reaction Noted   No known allergies  05/30/2020   No known drug allergy  06/25/2012    Family History  Problem Relation Age of Onset   Heart disease Father        myocardial infarction - died age 4   Heart disease Mother    Diabetes Mother    Diabetes Maternal Grandmother    Breast cancer Neg Hx    Colon cancer Neg Hx     Social History   Socioeconomic History   Marital status: Married    Spouse name: Not on file   Number of children:  1   Years of education: Not on file   Highest education level: Not on file  Occupational History   Not on file  Tobacco Use   Smoking status: Every Day    Current packs/day: 1.00    Average packs/day: 1 pack/day for 50.0 years (50.0 ttl pk-yrs)    Types: Cigarettes   Smokeless tobacco: Never   Tobacco comments:    is going to try electronic cigarettes  Vaping Use   Vaping status: Never Used  Substance and Sexual Activity   Alcohol use: No    Alcohol/week: 0.0 standard drinks of alcohol   Drug use: No   Sexual activity: Not Currently  Other Topics Concern   Not on file  Social History Narrative   Married    Social Drivers of Health   Financial Resource Strain: Low Risk  (11/09/2023)   Overall Financial Resource Strain (CARDIA)    Difficulty of Paying Living Expenses: Not hard at all  Food Insecurity: No Food Insecurity (11/09/2023)   Hunger Vital Sign    Worried About Running Out of Food in the Last Year: Never true    Ran Out of Food in the Last Year: Never true  Transportation Needs: No Transportation Needs (11/09/2023)   PRAPARE - Administrator, Civil Service (Medical): No    Lack of Transportation (Non-Medical): No  Physical  Activity: Inactive (11/09/2023)   Exercise Vital Sign    Days of Exercise per Week: 0 days    Minutes of Exercise per Session: 0 min  Stress: No Stress Concern Present (11/09/2023)   [employer] of Occupational Health - Occupational Stress Questionnaire    Feeling of Stress : Not at all  Social Connections: Moderately Integrated (11/09/2023)   Social Connection and Isolation Panel    Frequency of Communication with Friends and Family: More than three times a week    Frequency of Social Gatherings with Friends and Family: More than three times a week    Attends Religious Services: More than 4 times per year    Active Member of [employer] or Organizations: No    Attends Banker Meetings: Never    Marital Status: Married   Catering manager Violence: Not At Risk (11/09/2023)   Humiliation, Afraid, Rape, and Kick questionnaire    Fear of Current or Ex-Partner: No    Emotionally Abused: No    Physically Abused: No    Sexually Abused: No    Review of Systems: See HPI, otherwise negative ROS  Physical Exam: BP (!) 165/88   Pulse 78   Temp 97.8 F (36.6 C) (Temporal)   Resp 18   Ht 5' 3 (1.6 m)   Wt 64 kg   SpO2 98%   BMI 24.98 kg/m  General:   Alert,  pleasant and cooperative in NAD Head:  Normocephalic and atraumatic. Lungs:  Clear to auscultation.    Heart:  Regular rate and rhythm.   Impression/Plan: Monica Morales is here for ophthalmic surgery.  Risks, benefits, limitations, and alternatives regarding ophthalmic surgery have been reviewed with the patient.  Questions have been answered.  All parties agreeable.   Monica GASKIN, MD  03/14/2024, 8:18 AM

## 2024-03-14 NOTE — Op Note (Signed)
 OPERATIVE NOTE  Monica Morales [id number] 03/14/2024   PREOPERATIVE DIAGNOSIS:  Nuclear sclerotic cataract left eye. H25.12   POSTOPERATIVE DIAGNOSIS:    Nuclear sclerotic cataract left eye.     PROCEDURE:  Phacoemusification with posterior chamber intraocular lens placement of the left eye  Ultrasound time: Procedure(s): PHACOEMULSIFICATION, CATARACT, WITH IOL INSERTION 7.08 00:33.9 (Left)  LENS:   Implant Name Type Inv. Item Serial No. Manufacturer Lot No. LRB No. Used Action  LENS IOL TECNIS EYHANCE 18.5 - [id number] Intraocular Lens LENS IOL TECNIS EYHANCE 18.5 [id number] [employer]  Left 1 Implanted      SURGEON:  Dene FABIENE Etienne, MD   ANESTHESIA:  Topical with tetracaine  drops and 2% Xylocaine  jelly, augmented with 1% preservative-free intracameral lidocaine .    COMPLICATIONS:  None.   DESCRIPTION OF PROCEDURE:  The patient was identified in the holding room and transported to the operating room and placed in the supine position under the operating microscope.  The left eye was identified as the operative eye and it was prepped and draped in the usual sterile ophthalmic fashion.   A 1 millimeter clear-corneal paracentesis was made at the 1:30 position.  0.5 ml of preservative-free 1% lidocaine  was injected into the anterior chamber.  The anterior chamber was filled with Viscoat viscoelastic.  A 2.4 millimeter keratome was used to make a near-clear corneal incision at the 10:30 position.  .  A curvilinear capsulorrhexis was made with a cystotome and capsulorrhexis forceps.  Balanced salt solution was used to hydrodissect and hydrodelineate the nucleus.   Phacoemulsification was then used in stop and chop fashion to remove the lens nucleus and epinucleus.  The remaining cortex was then removed using the irrigation and aspiration handpiece. Provisc was then placed into the capsular bag to distend it for lens placement.  A lens was then injected into the capsular bag.  The  remaining viscoelastic was aspirated.   Wounds were hydrated with balanced salt solution.  The anterior chamber was inflated to a physiologic pressure with balanced salt solution.  No wound leaks were noted. Cefuroxime  0.1 ml of a 10mg /ml solution was injected into the anterior chamber for a dose of 1 mg of intracameral antibiotic at the completion of the case.   Timolol  and Brimonidine  drops were applied to the eye.  The patient was taken to the recovery room in stable condition without complications of anesthesia or surgery.  Cheyne Boulden 03/14/2024, 9:04 AM

## 2024-03-15 ENCOUNTER — Encounter: Payer: Self-pay | Admitting: Ophthalmology

## 2024-03-15 DIAGNOSIS — H2511 Age-related nuclear cataract, right eye: Secondary | ICD-10-CM | POA: Diagnosis not present

## 2024-03-21 ENCOUNTER — Encounter: Payer: Self-pay | Admitting: Ophthalmology

## 2024-03-21 NOTE — Anesthesia Preprocedure Evaluation (Addendum)
 Anesthesia Evaluation  Patient identified by MRN, date of birth, ID band Patient awake    Reviewed: Allergy & Precautions, H&P , NPO status , Patient's Chart, lab work & pertinent test results  Airway Mallampati: III  TM Distance: >3 FB Neck ROM: Full    Dental no notable dental hx.    Pulmonary Current SmokerPatient did not abstain from smoking.  Voice sounds like a smoker's voice, mild cough, non-productive   + decreased breath sounds      Cardiovascular hypertension, negative cardio ROS Normal cardiovascular exam Rhythm:Regular Rate:Normal     Neuro/Psych  Headaches negative neurological ROS  negative psych ROS   GI/Hepatic negative GI ROS, Neg liver ROS,GERD  ,,  Endo/Other  negative endocrine ROS    Renal/GU negative Renal ROS  negative genitourinary   Musculoskeletal negative musculoskeletal ROS (+)    Abdominal   Peds negative pediatric ROS (+)  Hematology negative hematology ROS (+)   Anesthesia Other Findings Previous cataract surgery 03-14-24 Dr. Chesley  Migraines Chronic bronchitis   Reproductive/Obstetrics negative OB ROS                              Anesthesia Physical Anesthesia Plan  ASA: 3  Anesthesia Plan: MAC   Post-op Pain Management:    Induction: Intravenous  PONV Risk Score and Plan:   Airway Management Planned: Natural Airway and Nasal Cannula  Additional Equipment:   Intra-op Plan:   Post-operative Plan:   Informed Consent: I have reviewed the patients History and Physical, chart, labs and discussed the procedure including the risks, benefits and alternatives for the proposed anesthesia with the patient or authorized representative who has indicated his/her understanding and acceptance.     Dental Advisory Given  Plan Discussed with: Anesthesiologist, CRNA and Surgeon  Anesthesia Plan Comments: (Patient consented for risks of anesthesia  including but not limited to:  - adverse reactions to medications - damage to eyes, teeth, lips or other oral mucosa - nerve damage due to positioning  - sore throat or hoarseness - Damage to heart, brain, nerves, lungs, other parts of body or loss of life  Patient voiced understanding and assent.)         Anesthesia Quick Evaluation

## 2024-03-26 NOTE — Discharge Instructions (Signed)

## 2024-03-28 ENCOUNTER — Encounter: Payer: Self-pay | Admitting: Ophthalmology

## 2024-03-28 ENCOUNTER — Ambulatory Visit
Admission: RE | Admit: 2024-03-28 | Discharge: 2024-03-28 | Disposition: A | Discharge status: Home / Self Care | Attending: Ophthalmology | Admitting: Ophthalmology

## 2024-03-28 ENCOUNTER — Encounter
Admission: RE | Disposition: A | Discharge status: Home / Self Care | Payer: Self-pay | Source: Home / Self Care | Attending: Ophthalmology

## 2024-03-28 ENCOUNTER — Other Ambulatory Visit: Payer: Self-pay

## 2024-03-28 ENCOUNTER — Ambulatory Visit: Payer: Self-pay | Admitting: Anesthesiology

## 2024-03-28 DIAGNOSIS — J42 Unspecified chronic bronchitis: Secondary | ICD-10-CM | POA: Diagnosis not present

## 2024-03-28 DIAGNOSIS — K219 Gastro-esophageal reflux disease without esophagitis: Secondary | ICD-10-CM | POA: Insufficient documentation

## 2024-03-28 DIAGNOSIS — H2511 Age-related nuclear cataract, right eye: Secondary | ICD-10-CM | POA: Insufficient documentation

## 2024-03-28 DIAGNOSIS — I10 Essential (primary) hypertension: Secondary | ICD-10-CM | POA: Diagnosis not present

## 2024-03-28 DIAGNOSIS — F17210 Nicotine dependence, cigarettes, uncomplicated: Secondary | ICD-10-CM | POA: Diagnosis not present

## 2024-03-28 HISTORY — PX: CATARACT EXTRACTION W/PHACO: SHX586

## 2024-03-28 SURGERY — PHACOEMULSIFICATION, CATARACT, WITH IOL INSERTION
Anesthesia: Monitor Anesthesia Care | Site: Eye | Laterality: Right

## 2024-03-28 MED ORDER — SIGHTPATH DOSE#1 BSS IO SOLN
INTRAOCULAR | Status: DC | PRN
  Administered 2024-03-28: 15 mL via INTRAOCULAR

## 2024-03-28 MED ORDER — BRIMONIDINE TARTRATE-TIMOLOL 0.2-0.5 % OP SOLN
OPHTHALMIC | Status: DC | PRN
  Administered 2024-03-28: 1 [drp] via OPHTHALMIC

## 2024-03-28 MED ORDER — SIGHTPATH DOSE#1 BSS IO SOLN
INTRAOCULAR | Status: DC | PRN
  Administered 2024-03-28: 61 mL via OPHTHALMIC

## 2024-03-28 MED ORDER — SIGHTPATH DOSE#1 NA HYALUR & NA CHOND-NA HYALUR IO KIT
PACK | INTRAOCULAR | Status: DC | PRN
  Administered 2024-03-28: 1 via OPHTHALMIC

## 2024-03-28 MED ORDER — FENTANYL CITRATE (PF) 100 MCG/2ML IJ SOLN
INTRAMUSCULAR | Status: DC | PRN
  Administered 2024-03-28: 50 ug via INTRAVENOUS

## 2024-03-28 MED ORDER — LACTATED RINGERS IV SOLN: INTRAVENOUS | Status: DC

## 2024-03-28 MED ORDER — TETRACAINE HCL 0.5 % OP SOLN
1.0000 [drp] | OPHTHALMIC | Status: DC | PRN
  Administered 2024-03-28 (×3): 1 [drp] via OPHTHALMIC

## 2024-03-28 MED ORDER — LIDOCAINE HCL (PF) 2 % IJ SOLN
INTRAOCULAR | Status: DC | PRN
  Administered 2024-03-28: 2 mL

## 2024-03-28 MED ORDER — FENTANYL CITRATE (PF) 100 MCG/2ML IJ SOLN
INTRAMUSCULAR | Status: AC
  Filled 2024-03-28: qty 2

## 2024-03-28 MED ORDER — CEFUROXIME OPHTHALMIC INJECTION 1 MG/0.1 ML
INJECTION | OPHTHALMIC | Status: DC | PRN
  Administered 2024-03-28: .1 mL via INTRACAMERAL

## 2024-03-28 MED ORDER — TETRACAINE HCL 0.5 % OP SOLN
OPHTHALMIC | Status: AC
  Filled 2024-03-28: qty 4

## 2024-03-28 MED ORDER — ARMC OPHTHALMIC DILATING DROPS
OPHTHALMIC | Status: AC
  Filled 2024-03-28: qty 0.5

## 2024-03-28 MED ORDER — MIDAZOLAM HCL 2 MG/2ML IJ SOLN
INTRAMUSCULAR | Status: DC | PRN
  Administered 2024-03-28: 1 mg via INTRAVENOUS

## 2024-03-28 MED ORDER — ARMC OPHTHALMIC DILATING DROPS
1.0000 | OPHTHALMIC | Status: DC | PRN
  Administered 2024-03-28 (×2): 1 via OPHTHALMIC

## 2024-03-28 MED ORDER — MIDAZOLAM HCL 2 MG/2ML IJ SOLN
INTRAMUSCULAR | Status: AC
  Filled 2024-03-28: qty 2

## 2024-03-28 SURGICAL SUPPLY — 8 items
FEE CATARACT SUITE SIGHTPATH (MISCELLANEOUS) ×1 IMPLANT
GLOVE BIOGEL PI IND STRL 8 (GLOVE) ×1 IMPLANT
GLOVE SURG LX STRL 7.5 STRW (GLOVE) ×1 IMPLANT
GLOVE SURG SYN 6.5 PF PI BL (GLOVE) ×1 IMPLANT
LENS IOL TECNIS EYHANCE 19.0 (Intraocular Lens) IMPLANT
NDL FILTER BLUNT 18X1 1/2 (NEEDLE) ×1 IMPLANT
NEEDLE FILTER BLUNT 18X1 1/2 (NEEDLE) ×1 IMPLANT
SYR 3ML LL SCALE MARK (SYRINGE) ×1 IMPLANT

## 2024-03-28 NOTE — Anesthesia Postprocedure Evaluation (Signed)
 Anesthesia Post Note  Patient: Monica Morales  Procedure(s) Performed: PHACOEMULSIFICATION, CATARACT, WITH IOL INSERTION 7.51 00:51.5 (Right: Eye)  Patient location during evaluation: PACU Anesthesia Type: MAC Level of consciousness: awake and alert Pain management: pain level controlled Vital Signs Assessment: post-procedure vital signs reviewed and stable Respiratory status: spontaneous breathing, nonlabored ventilation, respiratory function stable and patient connected to nasal cannula oxygen Cardiovascular status: stable and blood pressure returned to baseline Postop Assessment: no apparent nausea or vomiting Anesthetic complications: no   No notable events documented.   Last Vitals:  Vitals:   03/28/24 0944 03/28/24 0948  BP: (!) 132/100 (!) 142/90  Pulse: 77 74  Resp: (!) 21 15  Temp: (!) 36.3 C (!) 36.3 C  SpO2: 98% 98%    Last Pain:  Vitals:   03/28/24 0948  TempSrc:   PainSc: 0-No pain                 Deondra Wigger C Letcher Schweikert

## 2024-03-28 NOTE — H&P (Signed)
 [facility]   Primary Care Physician:  Glendia Shad, MD Ophthalmologist: Dr. Dene Etienne  Pre-Procedure History & Physical: HPI:  Monica Morales is a 78 y.o. female here for ophthalmic surgery.   Past Medical History:  Diagnosis Date   Chronic bronchitis (HCC)    Hypercholesterolemia    Migraines     Past Surgical History:  Procedure Laterality Date   ABDOMINAL HYSTERECTOMY  1979   secondary to fibroids, incidental appendectomy   BREAST EXCISIONAL BIOPSY Right 1979   benign   CATARACT EXTRACTION W/PHACO Left 03/14/2024   Procedure: PHACOEMULSIFICATION, CATARACT, WITH IOL INSERTION 7.08 00:33.9;  Surgeon: Etienne Dene, MD;  [facility]: [facility];  Service: Ophthalmology;  Laterality: Left;   TONSILLECTOMY  1960    Prior to Admission medications   Medication Sig Start Date End Date Taking? Authorizing Provider  albuterol  (VENTOLIN  HFA) 108 (90 Base) MCG/ACT inhaler Inhale 2 puffs into the lungs every 6 (six) hours as needed for wheezing or shortness of breath. 05/24/22  Yes Glendia Shad, MD  amLODipine  (NORVASC ) 2.5 MG tablet Take 1 tablet (2.5 mg total) by mouth daily. 12/14/23  Yes Glendia Shad, MD  atorvastatin  (LIPITOR) 20 MG tablet Take 1 tablet (20 mg total) by mouth daily. 12/14/23  Yes Glendia Shad, MD  azelastine  (ASTELIN ) 0.1 % nasal spray Place 1 spray into both nostrils 2 (two) times daily. Use in each nostril as directed 05/11/23  Yes Glendia Shad, MD  clotrimazole -betamethasone  (LOTRISONE ) cream Apply 1 Application topically daily. 12/14/23  Yes Glendia Shad, MD  Ibuprofen 200 MG CAPS Take by mouth.   Yes [provider]    Allergies as of 02/09/2024 - Review Complete 01/11/2024  Allergen Reaction Noted   No known allergies  05/30/2020   No known drug allergy  06/25/2012    Family History  Problem Relation Age of Onset   Heart disease Father        myocardial infarction - died age 80   Heart disease  Mother    Diabetes Mother    Diabetes Maternal Grandmother    Breast cancer Neg Hx    Colon cancer Neg Hx     Social History   Socioeconomic History   Marital status: Married    Spouse name: Not on file   Number of children: 1   Years of education: Not on file   Highest education level: Not on file  Occupational History   Not on file  Tobacco Use   Smoking status: Every Day    Current packs/day: 1.00    Average packs/day: 1 pack/day for 50.0 years (50.0 ttl pk-yrs)    Types: Cigarettes   Smokeless tobacco: Never   Tobacco comments:    is going to try electronic cigarettes  Vaping Use   Vaping status: Never Used  Substance and Sexual Activity   Alcohol use: No    Alcohol/week: 0.0 standard drinks of alcohol   Drug use: No   Sexual activity: Not Currently  Other Topics Concern   Not on file  Social History Narrative   Married    Social Drivers of Health   Financial Resource Strain: Low Risk  (11/09/2023)   Overall Financial Resource Strain (CARDIA)    Difficulty of Paying Living Expenses: Not hard at all  Food Insecurity: No Food Insecurity (11/09/2023)   Hunger Vital Sign    Worried About Running Out of Food in the Last Year: Never true    Ran Out of Food in the Last  Year: Never true  Transportation Needs: No Transportation Needs (11/09/2023)   PRAPARE - Administrator, Civil Service (Medical): No    Lack of Transportation (Non-Medical): No  Physical Activity: Inactive (11/09/2023)   Exercise Vital Sign    Days of Exercise per Week: 0 days    Minutes of Exercise per Session: 0 min  Stress: No Stress Concern Present (11/09/2023)   [employer] of Occupational Health - Occupational Stress Questionnaire    Feeling of Stress : Not at all  Social Connections: Moderately Integrated (11/09/2023)   Social Connection and Isolation Panel    Frequency of Communication with Friends and Family: More than three times a week    Frequency of Social Gatherings with  Friends and Family: More than three times a week    Attends Religious Services: More than 4 times per year    Active Member of [employer] or Organizations: No    Attends Banker Meetings: Never    Marital Status: Married  Catering manager Violence: Not At Risk (11/09/2023)   Humiliation, Afraid, Rape, and Kick questionnaire    Fear of Current or Ex-Partner: No    Emotionally Abused: No    Physically Abused: No    Sexually Abused: No    Review of Systems: See HPI, otherwise negative ROS  Physical Exam: BP (!) 159/85   Pulse 86   Temp 98.2 F (36.8 C) (Temporal)   Resp 14   Ht 5' 3 (1.6 m)   Wt 64.4 kg   SpO2 97%   BMI 25.15 kg/m  General:   Alert,  pleasant and cooperative in NAD Head:  Normocephalic and atraumatic. Lungs:  Clear to auscultation.    Heart:  Regular rate and rhythm.   Impression/Plan: Monica Morales is here for ophthalmic surgery.  Risks, benefits, limitations, and alternatives regarding ophthalmic surgery have been reviewed with the patient.  Questions have been answered.  All parties agreeable.   MITTIE GASKIN, MD  03/28/2024, 9:20 AM

## 2024-03-28 NOTE — Op Note (Signed)
 LOCATION:  [facility]   PREOPERATIVE DIAGNOSIS:    Nuclear sclerotic cataract right eye. H25.11   POSTOPERATIVE DIAGNOSIS:  Nuclear sclerotic cataract right eye.     PROCEDURE:  Phacoemusification with posterior chamber intraocular lens placement of the right eye   ULTRASOUND TIME: Procedure(s): PHACOEMULSIFICATION, CATARACT, WITH IOL INSERTION 7.51 00:51.5 (Right)  LENS:   Implant Name Type Inv. Item Serial No. Manufacturer Lot No. LRB No. Used Action  LENS IOL TECNIS EYHANCE 19.0 - [id number] Intraocular Lens LENS IOL TECNIS EYHANCE 19.0 [id number] [employer]  Right 1 Implanted         SURGEON:  Dene FABIENE Etienne, MD   ANESTHESIA:  Topical with tetracaine  drops and 2% Xylocaine  jelly, augmented with 1% preservative-free intracameral lidocaine .    COMPLICATIONS:  None.   DESCRIPTION OF PROCEDURE:  The patient was identified in the holding room and transported to the operating room and placed in the supine position under the operating microscope.  The right eye was identified as the operative eye and it was prepped and draped in the usual sterile ophthalmic fashion.   A 1 millimeter clear-corneal paracentesis was made at the 12:00 position.  0.5 ml of preservative-free 1% lidocaine  was injected into the anterior chamber. The anterior chamber was filled with Viscoat viscoelastic.  A 2.4 millimeter keratome was used to make a near-clear corneal incision at the 9:00 position.  A curvilinear capsulorrhexis was made with a cystotome and capsulorrhexis forceps.  Balanced salt solution was used to hydrodissect and hydrodelineate the nucleus.   Phacoemulsification was then used in stop and chop fashion to remove the lens nucleus and epinucleus.  The remaining cortex was then removed using the irrigation and aspiration handpiece. Provisc was then placed into the capsular bag to distend it for lens placement.  A lens was then injected into the capsular bag.  The remaining  viscoelastic was aspirated.   Wounds were hydrated with balanced salt solution.  The anterior chamber was inflated to a physiologic pressure with balanced salt solution.  No wound leaks were noted. Cefuroxime  0.1 ml of a 10mg /ml solution was injected into the anterior chamber for a dose of 1 mg of intracameral antibiotic at the completion of the case.   Timolol  and Brimonidine  drops were applied to the eye.  The patient was taken to the recovery room in stable condition without complications of anesthesia or surgery.   Monica Morales 03/28/2024, 9:42 AM

## 2024-03-28 NOTE — Transfer of Care (Signed)
 Immediate Anesthesia Transfer of Care Note  Patient: Monica Morales  Procedure(s) Performed: PHACOEMULSIFICATION, CATARACT, WITH IOL INSERTION 7.51 00:51.5 (Right: Eye)  Patient Location: PACU  Anesthesia Type: MAC  Level of Consciousness: awake, alert  and patient cooperative  Airway and Oxygen Therapy: Patient Spontanous Breathing and Patient connected to supplemental oxygen  Post-op Assessment: Post-op Vital signs reviewed, Patient's Cardiovascular Status Stable, Respiratory Function Stable, Patent Airway and No signs of Nausea or vomiting  Post-op Vital Signs: Reviewed and stable  Complications: No notable events documented.

## 2024-04-11 ENCOUNTER — Ambulatory Visit: Admitting: Podiatry

## 2024-04-11 DIAGNOSIS — M19071 Primary osteoarthritis, right ankle and foot: Secondary | ICD-10-CM | POA: Diagnosis not present

## 2024-04-11 DIAGNOSIS — M19072 Primary osteoarthritis, left ankle and foot: Secondary | ICD-10-CM | POA: Diagnosis not present

## 2024-04-11 MED ORDER — TRIAMCINOLONE ACETONIDE 40 MG/ML IJ SUSP
20.0000 mg | Freq: Once | INTRAMUSCULAR | Status: AC
  Administered 2024-04-11: 20 mg

## 2024-04-11 NOTE — Progress Notes (Signed)
 She presents today states that I get some here for my shots my arthritis is acting up.  Objective: Vital signs are stable she is alert and oriented x 3.  The majority of the pain in her right foot is just near the navicular tuberosity.  Left foot overlying the dorsal tarsometatarsal joints.  Assessment: Osteoarthritis insertional tendinitis.  Plan: I injected Kenalog  and local anesthetic to the point of maximal tenderness bilateral foot.  Tolerated procedure well without complications and will follow-up with her in 3 to 4 months.

## 2024-04-17 ENCOUNTER — Ambulatory Visit: Admitting: Internal Medicine

## 2024-05-07 ENCOUNTER — Ambulatory Visit: Admitting: Internal Medicine

## 2024-05-08 ENCOUNTER — Ambulatory Visit: Admitting: Internal Medicine

## 2024-05-08 ENCOUNTER — Encounter: Payer: Self-pay | Admitting: Internal Medicine

## 2024-05-08 VITALS — BP 138/86 | HR 79 | Temp 97.5°F | Ht 63.0 in | Wt 143.2 lb

## 2024-05-08 DIAGNOSIS — J42 Unspecified chronic bronchitis: Secondary | ICD-10-CM | POA: Diagnosis not present

## 2024-05-08 DIAGNOSIS — I10 Essential (primary) hypertension: Secondary | ICD-10-CM | POA: Diagnosis not present

## 2024-05-08 DIAGNOSIS — D473 Essential (hemorrhagic) thrombocythemia: Secondary | ICD-10-CM | POA: Diagnosis not present

## 2024-05-08 DIAGNOSIS — Z716 Tobacco abuse counseling: Secondary | ICD-10-CM | POA: Diagnosis not present

## 2024-05-08 DIAGNOSIS — D75839 Thrombocytosis, unspecified: Secondary | ICD-10-CM | POA: Diagnosis not present

## 2024-05-08 DIAGNOSIS — E7800 Pure hypercholesterolemia, unspecified: Secondary | ICD-10-CM

## 2024-05-08 DIAGNOSIS — M79672 Pain in left foot: Secondary | ICD-10-CM | POA: Diagnosis not present

## 2024-05-08 DIAGNOSIS — F439 Reaction to severe stress, unspecified: Secondary | ICD-10-CM | POA: Diagnosis not present

## 2024-05-08 DIAGNOSIS — M79671 Pain in right foot: Secondary | ICD-10-CM | POA: Diagnosis not present

## 2024-05-08 LAB — HEPATIC FUNCTION PANEL
ALT: 18 U/L (ref 0–35)
AST: 19 U/L (ref 0–37)
Albumin: 4.7 g/dL (ref 3.5–5.2)
Alkaline Phosphatase: 87 U/L (ref 39–117)
Bilirubin, Direct: 0.1 mg/dL (ref 0.0–0.3)
Total Bilirubin: 0.7 mg/dL (ref 0.2–1.2)
Total Protein: 7 g/dL (ref 6.0–8.3)

## 2024-05-08 LAB — CBC WITH DIFFERENTIAL/PLATELET
Basophils Absolute: 0.1 K/uL (ref 0.0–0.1)
Basophils Relative: 0.6 % (ref 0.0–3.0)
Eosinophils Absolute: 0.1 K/uL (ref 0.0–0.7)
Eosinophils Relative: 1.4 % (ref 0.0–5.0)
HCT: 41.2 % (ref 36.0–46.0)
Hemoglobin: 13.7 g/dL (ref 12.0–15.0)
Lymphocytes Relative: 26.4 % (ref 12.0–46.0)
Lymphs Abs: 2.3 K/uL (ref 0.7–4.0)
MCHC: 33.2 g/dL (ref 30.0–36.0)
MCV: 95.8 fl (ref 78.0–100.0)
Monocytes Absolute: 0.6 K/uL (ref 0.1–1.0)
Monocytes Relative: 7.5 % (ref 3.0–12.0)
Neutro Abs: 5.5 K/uL (ref 1.4–7.7)
Neutrophils Relative %: 64.1 % (ref 43.0–77.0)
Platelets: 417 K/uL — ABNORMAL HIGH (ref 150.0–400.0)
RBC: 4.3 Mil/uL (ref 3.87–5.11)
RDW: 13.7 % (ref 11.5–15.5)
WBC: 8.5 K/uL (ref 4.0–10.5)

## 2024-05-08 LAB — BASIC METABOLIC PANEL WITH GFR
BUN: 18 mg/dL (ref 6–23)
CO2: 26 meq/L (ref 19–32)
Calcium: 9.5 mg/dL (ref 8.4–10.5)
Chloride: 104 meq/L (ref 96–112)
Creatinine, Ser: 0.67 mg/dL (ref 0.40–1.20)
GFR: 82.66 mL/min (ref >60.00)
Glucose, Bld: 98 mg/dL (ref 70–99)
Potassium: 4.8 meq/L (ref 3.5–5.1)
Sodium: 138 meq/L (ref 135–145)

## 2024-05-08 LAB — LIPID PANEL
Cholesterol: 191 mg/dL (ref 0–200)
HDL: 98.1 mg/dL (ref >39.00)
LDL Cholesterol: 72 mg/dL (ref 0–99)
NonHDL: 92.79
Total CHOL/HDL Ratio: 2
Triglycerides: 106 mg/dL (ref 0.0–149.0)
VLDL: 21.2 mg/dL (ref 0.0–40.0)

## 2024-05-08 MED ORDER — AMLODIPINE BESYLATE 2.5 MG PO TABS: 2.5000 mg | ORAL_TABLET | Freq: Every day | ORAL | 1 refills | Status: AC

## 2024-05-08 NOTE — Progress Notes (Signed)
 [name], female    DOB: 1946-03-22, 78 y.o.   MRN: [id number]  Patient here for  Chief Complaint  Patient presents with   Hypertension    HPI Here for a scheduled follow up - follow up regarding hypertension, hypercholesterolemia, increased stress and thrombocytosis. S/p recent eye surgery. S/p recent foot injection - podiatry. Overall she feels she is doing relatively well. No chest pain. Breathing stable. No abdominal pain or bowel change reported.    Past Medical History:  Diagnosis Date   Chronic bronchitis (HCC)    Hypercholesterolemia    Migraines    Past Surgical History:  Procedure Laterality Date   ABDOMINAL HYSTERECTOMY  1979   secondary to fibroids, incidental appendectomy   BREAST EXCISIONAL BIOPSY Right 1979   benign   CATARACT EXTRACTION W/PHACO Left 03/14/2024   Procedure: PHACOEMULSIFICATION, CATARACT, WITH IOL INSERTION 7.08 00:33.9;  Surgeon: Mittie Gaskin, MD;  Location: [facility];  Service: Ophthalmology;  Laterality: Left;   CATARACT EXTRACTION W/PHACO Right 03/28/2024   Procedure: PHACOEMULSIFICATION, CATARACT, WITH IOL INSERTION 7.51 00:51.5;  Surgeon: Mittie Gaskin, MD;  Location: [facility];  Service: Ophthalmology;  Laterality: Right;   TONSILLECTOMY  1960   Family History  Problem Relation Age of Onset   Heart disease Father        myocardial infarction - died age 19   Heart disease Mother    Diabetes Mother    Diabetes Maternal Grandmother    Breast cancer Neg Hx    Colon cancer Neg Hx    Social History   Socioeconomic History   Marital status: Married    Spouse name: Not on file   Number of children: 1   Years of education: Not on file   Highest education level: Not on file  Occupational History   Not on file  Tobacco Use   Smoking status: Every Day    Current packs/day: 1.00    Average packs/day: 1 pack/day for 50.0 years (50.0 ttl pk-yrs)    Types: Cigarettes    Smokeless tobacco: Never   Tobacco comments:    is going to try electronic cigarettes  Vaping Use   Vaping status: Never Used  Substance and Sexual Activity   Alcohol use: No    Alcohol/week: 0.0 standard drinks of alcohol   Drug use: No   Sexual activity: Not Currently  Other Topics Concern   Not on file  Social History Narrative   Married    Social Drivers of Health   Financial Resource Strain: Low Risk  (11/09/2023)   Overall Financial Resource Strain (CARDIA)    Difficulty of Paying Living Expenses: Not hard at all  Food Insecurity: No Food Insecurity (11/09/2023)   Hunger Vital Sign    Worried About Running Out of Food in the Last Year: Never true    Ran Out of Food in the Last Year: Never true  Transportation Needs: No Transportation Needs (11/09/2023)   PRAPARE - Administrator, Civil Service (Medical): No    Lack of Transportation (Non-Medical): No  Physical Activity: Inactive (11/09/2023)   Exercise Vital Sign    Days of Exercise per Week: 0 days    Minutes of Exercise per Session: 0 min  Stress: No Stress Concern Present (11/09/2023)   [employer] of Occupational Health - Occupational Stress Questionnaire    Feeling of Stress : Not at all  Social Connections: Moderately Integrated (11/09/2023)   Social Connection  and Isolation Panel    Frequency of Communication with Friends and Family: More than three times a week    Frequency of Social Gatherings with Friends and Family: More than three times a week    Attends Religious Services: More than 4 times per year    Active Member of [employer] or Organizations: No    Attends Banker Meetings: Never    Marital Status: Married     Review of Systems  Constitutional:  Negative for appetite change and unexpected weight change.  HENT:  Negative for congestion and sinus pressure.   Respiratory:  Negative for chest tightness.        No increased cough. Breathing stable.   Cardiovascular:  Negative  for chest pain, palpitations and leg swelling.  Gastrointestinal:  Negative for abdominal pain, diarrhea, nausea and vomiting.  Genitourinary:  Negative for difficulty urinating and dysuria.  Musculoskeletal:  Negative for joint swelling and myalgias.  Skin:  Negative for color change and rash.  Neurological:  Negative for dizziness and headaches.  Psychiatric/Behavioral:  Negative for agitation and dysphoric mood.        Objective:     BP 138/86   Pulse 79   Temp (!) 97.5 F (36.4 C) (Oral)   Ht 5' 3 (1.6 m)   Wt 143 lb 3.2 oz (65 kg)   SpO2 98%   BMI 25.37 kg/m  Wt Readings from Last 3 Encounters:  05/08/24 143 lb 3.2 oz (65 kg)  03/28/24 142 lb (64.4 kg)  03/14/24 141 lb (64 kg)    Physical Exam Vitals reviewed.  Constitutional:      General: She is not in acute distress.    Appearance: Normal appearance.  HENT:     Head: Normocephalic and atraumatic.     Right Ear: External ear normal.     Left Ear: External ear normal.     Mouth/Throat:     Pharynx: No oropharyngeal exudate or posterior oropharyngeal erythema.  Eyes:     General: No scleral icterus.       Right eye: No discharge.        Left eye: No discharge.     Conjunctiva/sclera: Conjunctivae normal.  Neck:     Thyroid : No thyromegaly.  Cardiovascular:     Rate and Rhythm: Normal rate and regular rhythm.  Pulmonary:     Effort: No respiratory distress.     Breath sounds: Normal breath sounds. No wheezing.  Abdominal:     General: Bowel sounds are normal.     Palpations: Abdomen is soft.     Tenderness: There is no abdominal tenderness.  Musculoskeletal:        General: No swelling or tenderness.     Cervical back: Neck supple. No tenderness.  Lymphadenopathy:     Cervical: No cervical adenopathy.  Skin:    Findings: No erythema or rash.  Neurological:     Mental Status: She is alert.  Psychiatric:        Mood and Affect: Mood normal.        Behavior: Behavior normal.          Outpatient Encounter Medications as of 05/08/2024  Medication Sig   albuterol  (VENTOLIN  HFA) 108 (90 Base) MCG/ACT inhaler Inhale 2 puffs into the lungs every 6 (six) hours as needed for wheezing or shortness of breath.   atorvastatin  (LIPITOR) 20 MG tablet Take 1 tablet (20 mg total) by mouth daily.   azelastine  (ASTELIN ) 0.1 % nasal spray Place  1 spray into both nostrils 2 (two) times daily. Use in each nostril as directed   clotrimazole -betamethasone  (LOTRISONE ) cream Apply 1 Application topically daily.   Ibuprofen 200 MG CAPS Take by mouth.   amLODipine  (NORVASC ) 2.5 MG tablet Take 1 tablet (2.5 mg total) by mouth daily.   [DISCONTINUED] amLODipine  (NORVASC ) 2.5 MG tablet Take 1 tablet (2.5 mg total) by mouth daily.   No facility-administered encounter medications on file as of 05/08/2024.     Lab Results  Component Value Date   WBC 8.5 05/08/2024   HGB 13.7 05/08/2024   HCT 41.2 05/08/2024   PLT 417.0 (H) 05/08/2024   GLUCOSE 98 05/08/2024   CHOL 191 05/08/2024   TRIG 106.0 05/08/2024   HDL 98.10 05/08/2024   LDLDIRECT 80.0 08/11/2021   LDLCALC 72 05/08/2024   ALT 18 05/08/2024   AST 19 05/08/2024   NA 138 05/08/2024   K 4.8 05/08/2024   CL 104 05/08/2024   CREATININE 0.67 05/08/2024   BUN 18 05/08/2024   CO2 26 05/08/2024   TSH 2.22 12/14/2023   MICROALBUR 0.7 01/24/2017       Assessment & Plan:  Tobacco abuse counseling Assessment & Plan: Have discussed with her regarding the need to quit smoking.  She declines to quit at this time.  Discussed screening CT. Declines.    Hypercholesterolemia Assessment & Plan: Continue lipitor.  Low cholesterol diet and exercise.  Follow lipid panel and liver function tests.   Orders: -     Lipid panel -     Hepatic function panel  Hypertension, unspecified type Assessment & Plan: Blood pressure as outlined.  Continue amlodipine . Follow pressures.  Follow metabolic panel.  Have her spot check pressures. No changes in  medication today. Follow.   Orders: -     Basic metabolic panel with GFR  Thrombocytosis (HCC) Assessment & Plan: Worked up by hematology.  Follow cbc.   Orders: -     CBC with Differential/Platelet  Stress Assessment & Plan: Overall appears to be handling things well. Follow.    Foot pain, bilateral Assessment & Plan: Sees Dr Drena. S/p injection.    Chronic bronchitis, unspecified chronic bronchitis type (HCC) Assessment & Plan: Breathing stable. No increased cough or congestion.    Essential (hemorrhagic) thrombocythemia (HCC) Assessment & Plan: Worked up by hematology.  Follow cbc.    Other orders -     amLODIPine  Besylate; Take 1 tablet (2.5 mg total) by mouth daily.  Dispense: 90 tablet; Refill: 1     Allena Hamilton, MD

## 2024-05-08 NOTE — Assessment & Plan Note (Signed)
 Worked up by hematology.  Follow cbc.

## 2024-05-09 ENCOUNTER — Ambulatory Visit: Payer: Self-pay | Admitting: Internal Medicine

## 2024-05-11 DIAGNOSIS — L570 Actinic keratosis: Secondary | ICD-10-CM | POA: Diagnosis not present

## 2024-05-11 DIAGNOSIS — L578 Other skin changes due to chronic exposure to nonionizing radiation: Secondary | ICD-10-CM | POA: Diagnosis not present

## 2024-05-11 DIAGNOSIS — L821 Other seborrheic keratosis: Secondary | ICD-10-CM | POA: Diagnosis not present

## 2024-05-11 DIAGNOSIS — L814 Other melanin hyperpigmentation: Secondary | ICD-10-CM | POA: Diagnosis not present

## 2024-05-11 DIAGNOSIS — D0439 Carcinoma in situ of skin of other parts of face: Secondary | ICD-10-CM | POA: Diagnosis not present

## 2024-05-13 ENCOUNTER — Encounter: Payer: Self-pay | Admitting: Internal Medicine

## 2024-05-13 NOTE — Assessment & Plan Note (Signed)
 Blood pressure as outlined.  Continue amlodipine . Follow pressures.  Follow metabolic panel.  Have her spot check pressures. No changes in medication today. Follow.

## 2024-05-13 NOTE — Assessment & Plan Note (Signed)
 Have discussed with her regarding the need to quit smoking.  She declines to quit at this time.  Discussed screening CT. Declines.

## 2024-05-13 NOTE — Assessment & Plan Note (Signed)
 Breathing stable.  No increased cough or congestion.

## 2024-05-13 NOTE — Assessment & Plan Note (Signed)
 Continue lipitor.  Low cholesterol diet and exercise.  Follow lipid panel and liver function tests.

## 2024-05-13 NOTE — Assessment & Plan Note (Signed)
 Overall appears to be handling things well.  Follow.  ?

## 2024-05-13 NOTE — Assessment & Plan Note (Signed)
 Sees Dr Drena. S/p injection.

## 2024-07-04 ENCOUNTER — Ambulatory Visit: Admitting: Podiatry

## 2024-07-04 DIAGNOSIS — L989 Disorder of the skin and subcutaneous tissue, unspecified: Secondary | ICD-10-CM

## 2024-07-04 NOTE — Progress Notes (Signed)
 She presents today for a chief complaint of a painful callus on the plantar aspect of her right heel.  She states that is painful is been there for a while she is using pain stop cream on the top of the foot and she states that she does not think she needs the shots for her arthritis.  Objective: Vitals were not taken today.  Pulses are palpable bilateral.  She has mild tenderness on palpation dorsal aspect of the bilateral foot and frontal plane range of motion at the tarsometatarsal joints.  This is not nearly as bad as it usually is at this time.  She does have a benign skin lesion centrally located rear foot plantar aspect right.  Does not appear to be verrucoid in nature.  Assessment: Benign lesion plantar aspect right foot.  Plan: Mechanical and chemical destruction of this lesion today placed Salinocaine under occlusion to be washed off in 3 days.  Will follow-up with her in 3 months for injections if necessary.

## 2024-09-10 ENCOUNTER — Ambulatory Visit: Admitting: Internal Medicine

## 2024-10-03 ENCOUNTER — Ambulatory Visit: Admitting: Podiatry

## 2024-11-14 ENCOUNTER — Ambulatory Visit
